# Patient Record
Sex: Female | Born: 1985 | Race: Black or African American | Hispanic: No | Marital: Single | Smoking: Former smoker
Health system: Southern US, Community
[De-identification: ages and names within clinical notes are randomized; demographics above are authoritative.]

## PROBLEM LIST (undated history)

## (undated) ENCOUNTER — Inpatient Hospital Stay (HOSPITAL_COMMUNITY): Payer: Self-pay

## (undated) DIAGNOSIS — E079 Disorder of thyroid, unspecified: Secondary | ICD-10-CM

## (undated) DIAGNOSIS — I10 Essential (primary) hypertension: Secondary | ICD-10-CM

## (undated) DIAGNOSIS — A749 Chlamydial infection, unspecified: Secondary | ICD-10-CM

## (undated) DIAGNOSIS — O24419 Gestational diabetes mellitus in pregnancy, unspecified control: Secondary | ICD-10-CM

## (undated) DIAGNOSIS — E669 Obesity, unspecified: Secondary | ICD-10-CM

## (undated) DIAGNOSIS — A599 Trichomoniasis, unspecified: Secondary | ICD-10-CM

## (undated) DIAGNOSIS — D219 Benign neoplasm of connective and other soft tissue, unspecified: Secondary | ICD-10-CM

## (undated) DIAGNOSIS — B86 Scabies: Secondary | ICD-10-CM

## (undated) HISTORY — DX: Disorder of thyroid, unspecified: E07.9

## (undated) HISTORY — PX: WISDOM TOOTH EXTRACTION: SHX21

---

## 2009-03-13 ENCOUNTER — Emergency Department (HOSPITAL_COMMUNITY): Admission: EM | Admit: 2009-03-13 | Discharge: 2009-03-13 | Payer: Self-pay | Admitting: Emergency Medicine

## 2009-04-04 ENCOUNTER — Encounter: Payer: Self-pay | Admitting: Emergency Medicine

## 2009-04-04 ENCOUNTER — Encounter (INDEPENDENT_AMBULATORY_CARE_PROVIDER_SITE_OTHER): Payer: Self-pay | Admitting: Obstetrics and Gynecology

## 2009-04-04 ENCOUNTER — Ambulatory Visit (HOSPITAL_COMMUNITY): Admission: AD | Admit: 2009-04-04 | Discharge: 2009-04-04 | Payer: Self-pay | Admitting: Obstetrics and Gynecology

## 2009-07-31 ENCOUNTER — Emergency Department (HOSPITAL_COMMUNITY): Admission: EM | Admit: 2009-07-31 | Discharge: 2009-07-31 | Payer: Self-pay | Admitting: Emergency Medicine

## 2009-12-29 ENCOUNTER — Emergency Department (HOSPITAL_COMMUNITY): Admission: EM | Admit: 2009-12-29 | Discharge: 2009-12-29 | Payer: Self-pay | Admitting: Family Medicine

## 2010-07-10 ENCOUNTER — Emergency Department (HOSPITAL_COMMUNITY): Admission: EM | Admit: 2010-07-10 | Discharge: 2010-07-10 | Payer: Self-pay | Admitting: Emergency Medicine

## 2010-09-03 ENCOUNTER — Emergency Department (HOSPITAL_COMMUNITY): Admission: EM | Admit: 2010-09-03 | Discharge: 2010-09-03 | Payer: Self-pay | Admitting: Emergency Medicine

## 2010-09-14 ENCOUNTER — Inpatient Hospital Stay (HOSPITAL_COMMUNITY)
Admission: AD | Admit: 2010-09-14 | Discharge: 2010-09-14 | Payer: Self-pay | Source: Home / Self Care | Admitting: Obstetrics & Gynecology

## 2010-09-18 ENCOUNTER — Emergency Department (HOSPITAL_COMMUNITY): Admission: EM | Admit: 2010-09-18 | Discharge: 2010-09-19 | Payer: Self-pay | Admitting: Emergency Medicine

## 2010-11-08 ENCOUNTER — Emergency Department (HOSPITAL_COMMUNITY)
Admission: EM | Admit: 2010-11-08 | Discharge: 2010-11-08 | Payer: Self-pay | Source: Home / Self Care | Admitting: Emergency Medicine

## 2010-11-12 LAB — DIFFERENTIAL
Basophils Absolute: 0 10*3/uL (ref 0.0–0.1)
Basophils Relative: 0 % (ref 0–1)
Eosinophils Absolute: 0.1 10*3/uL (ref 0.0–0.7)
Eosinophils Relative: 1 % (ref 0–5)
Lymphocytes Relative: 40 % (ref 12–46)
Lymphs Abs: 4.7 10*3/uL — ABNORMAL HIGH (ref 0.7–4.0)
Monocytes Absolute: 1.3 10*3/uL — ABNORMAL HIGH (ref 0.1–1.0)
Monocytes Relative: 11 % (ref 3–12)
Neutro Abs: 5.8 10*3/uL (ref 1.7–7.7)
Neutrophils Relative %: 49 % (ref 43–77)

## 2010-11-12 LAB — URINALYSIS, ROUTINE W REFLEX MICROSCOPIC
Bilirubin Urine: NEGATIVE
Ketones, ur: NEGATIVE mg/dL
Nitrite: POSITIVE — AB
Protein, ur: 100 mg/dL — AB
Specific Gravity, Urine: 1.022 (ref 1.005–1.030)
Urine Glucose, Fasting: NEGATIVE mg/dL
Urobilinogen, UA: 1 mg/dL (ref 0.0–1.0)
pH: 7 (ref 5.0–8.0)

## 2010-11-12 LAB — CBC
HCT: 41 % (ref 36.0–46.0)
Hemoglobin: 13.3 g/dL (ref 12.0–15.0)
MCH: 23.2 pg — ABNORMAL LOW (ref 26.0–34.0)
MCHC: 32.4 g/dL (ref 30.0–36.0)
MCV: 71.6 fL — ABNORMAL LOW (ref 78.0–100.0)
Platelets: 270 10*3/uL (ref 150–400)
RBC: 5.73 MIL/uL — ABNORMAL HIGH (ref 3.87–5.11)
RDW: 15.7 % — ABNORMAL HIGH (ref 11.5–15.5)
WBC: 11.9 10*3/uL — ABNORMAL HIGH (ref 4.0–10.5)

## 2010-11-12 LAB — BASIC METABOLIC PANEL
BUN: 10 mg/dL (ref 6–23)
CO2: 25 mEq/L (ref 19–32)
Calcium: 9.1 mg/dL (ref 8.4–10.5)
Chloride: 107 mEq/L (ref 96–112)
Creatinine, Ser: 0.7 mg/dL (ref 0.4–1.2)
GFR calc Af Amer: >60 mL/min (ref >60)
GFR calc non Af Amer: >60 mL/min (ref >60)
Glucose, Bld: 83 mg/dL (ref 70–99)
Potassium: 4.1 mEq/L (ref 3.5–5.1)
Sodium: 139 mEq/L (ref 135–145)

## 2010-11-12 LAB — URINE MICROSCOPIC-ADD ON

## 2010-11-12 LAB — LIPASE, BLOOD: Lipase: 23 U/L (ref 11–59)

## 2010-11-12 LAB — POCT PREGNANCY, URINE: Preg Test, Ur: NEGATIVE

## 2011-01-08 LAB — WET PREP, GENITAL
Trich, Wet Prep: NONE SEEN
Trich, Wet Prep: NONE SEEN
Yeast Wet Prep HPF POC: NONE SEEN
Yeast Wet Prep HPF POC: NONE SEEN

## 2011-01-08 LAB — URINALYSIS, ROUTINE W REFLEX MICROSCOPIC
Glucose, UA: NEGATIVE mg/dL
Glucose, UA: NEGATIVE mg/dL
Glucose, UA: NEGATIVE mg/dL
Hgb urine dipstick: NEGATIVE
Hgb urine dipstick: NEGATIVE
Hgb urine dipstick: NEGATIVE
Ketones, ur: 15 mg/dL — AB
Ketones, ur: 15 mg/dL — AB
Ketones, ur: >80 mg/dL — AB
Nitrite: NEGATIVE
Nitrite: NEGATIVE
Nitrite: NEGATIVE
Protein, ur: NEGATIVE mg/dL
Protein, ur: NEGATIVE mg/dL
Protein, ur: NEGATIVE mg/dL
Specific Gravity, Urine: 1.025 (ref 1.005–1.030)
Specific Gravity, Urine: 1.026 (ref 1.005–1.030)
Specific Gravity, Urine: 1.035 — ABNORMAL HIGH (ref 1.005–1.030)
Urobilinogen, UA: 1 mg/dL (ref 0.0–1.0)
Urobilinogen, UA: 1 mg/dL (ref 0.0–1.0)
Urobilinogen, UA: 1 mg/dL (ref 0.0–1.0)
pH: 6 (ref 5.0–8.0)
pH: 6 (ref 5.0–8.0)
pH: 7 (ref 5.0–8.0)

## 2011-01-08 LAB — CBC
HCT: 38.5 % (ref 36.0–46.0)
HCT: 38.8 % (ref 36.0–46.0)
Hemoglobin: 12.7 g/dL (ref 12.0–15.0)
Hemoglobin: 12.8 g/dL (ref 12.0–15.0)
MCH: 23.3 pg — ABNORMAL LOW (ref 26.0–34.0)
MCHC: 32.9 g/dL (ref 30.0–36.0)
MCHC: 33 g/dL (ref 30.0–36.0)
MCV: 70.7 fL — ABNORMAL LOW (ref 78.0–100.0)
Platelets: 245 10*3/uL (ref 150–400)
RBC: 5.49 MIL/uL — ABNORMAL HIGH (ref 3.87–5.11)
RDW: 16 % — ABNORMAL HIGH (ref 11.5–15.5)
WBC: 10 10*3/uL (ref 4.0–10.5)

## 2011-01-08 LAB — DIFFERENTIAL
Basophils Absolute: 0 10*3/uL (ref 0.0–0.1)
Basophils Relative: 0 % (ref 0–1)
Eosinophils Absolute: 0.3 10*3/uL (ref 0.0–0.7)
Eosinophils Relative: 3 % (ref 0–5)
Lymphocytes Relative: 27 % (ref 12–46)
Lymphs Abs: 2.7 10*3/uL (ref 0.7–4.0)
Monocytes Absolute: 1.1 10*3/uL — ABNORMAL HIGH (ref 0.1–1.0)
Monocytes Relative: 11 % (ref 3–12)
Neutro Abs: 5.9 10*3/uL (ref 1.7–7.7)
Neutrophils Relative %: 59 % (ref 43–77)

## 2011-01-08 LAB — URINE CULTURE
Colony Count: 15000
Culture  Setup Time: 201111071324

## 2011-01-08 LAB — URINE MICROSCOPIC-ADD ON

## 2011-01-08 LAB — GC/CHLAMYDIA PROBE AMP, GENITAL
Chlamydia, DNA Probe: NEGATIVE
GC Probe Amp, Genital: NEGATIVE

## 2011-01-08 LAB — BASIC METABOLIC PANEL
BUN: 3 mg/dL — ABNORMAL LOW (ref 6–23)
CO2: 24 mEq/L (ref 19–32)
Calcium: 9 mg/dL (ref 8.4–10.5)
Chloride: 105 mEq/L (ref 96–112)
Creatinine, Ser: 0.57 mg/dL (ref 0.4–1.2)
GFR calc Af Amer: >60 mL/min (ref >60)
GFR calc non Af Amer: >60 mL/min (ref >60)
Glucose, Bld: 79 mg/dL (ref 70–99)
Potassium: 3.4 mEq/L — ABNORMAL LOW (ref 3.5–5.1)
Sodium: 136 mEq/L (ref 135–145)

## 2011-01-08 LAB — POCT PREGNANCY, URINE
Preg Test, Ur: POSITIVE
Preg Test, Ur: POSITIVE

## 2011-01-08 LAB — ABO/RH: ABO/RH(D): O POS

## 2011-01-10 LAB — GC/CHLAMYDIA PROBE AMP, GENITAL
Chlamydia, DNA Probe: NEGATIVE
GC Probe Amp, Genital: NEGATIVE

## 2011-01-10 LAB — URINALYSIS, ROUTINE W REFLEX MICROSCOPIC
Glucose, UA: NEGATIVE mg/dL
Hgb urine dipstick: NEGATIVE
Ketones, ur: NEGATIVE mg/dL
Protein, ur: NEGATIVE mg/dL

## 2011-01-10 LAB — WET PREP, GENITAL: WBC, Wet Prep HPF POC: NONE SEEN

## 2011-01-10 LAB — POCT PREGNANCY, URINE: Preg Test, Ur: NEGATIVE

## 2011-01-21 LAB — POCT URINALYSIS DIP (DEVICE)
Glucose, UA: NEGATIVE mg/dL
Nitrite: NEGATIVE
Urobilinogen, UA: 0.2 mg/dL (ref 0.0–1.0)

## 2011-01-21 LAB — GC/CHLAMYDIA PROBE AMP, GENITAL: GC Probe Amp, Genital: NEGATIVE

## 2011-01-21 LAB — POCT PREGNANCY, URINE: Preg Test, Ur: NEGATIVE

## 2011-01-31 LAB — GC/CHLAMYDIA PROBE AMP, GENITAL
Chlamydia, DNA Probe: NEGATIVE
GC Probe Amp, Genital: NEGATIVE

## 2011-01-31 LAB — WET PREP, GENITAL

## 2011-01-31 LAB — URINALYSIS, ROUTINE W REFLEX MICROSCOPIC
Glucose, UA: NEGATIVE mg/dL
Specific Gravity, Urine: 1.026 (ref 1.005–1.030)

## 2011-01-31 LAB — URINE MICROSCOPIC-ADD ON

## 2011-02-04 LAB — BASIC METABOLIC PANEL
CO2: 26 mEq/L (ref 19–32)
Calcium: 9.2 mg/dL (ref 8.4–10.5)
Chloride: 109 mEq/L (ref 96–112)
Glucose, Bld: 91 mg/dL (ref 70–99)
Potassium: 3.7 mEq/L (ref 3.5–5.1)
Sodium: 141 mEq/L (ref 135–145)

## 2011-02-04 LAB — CBC
HCT: 38.7 % (ref 36.0–46.0)
Hemoglobin: 12.7 g/dL (ref 12.0–15.0)
MCHC: 32.8 g/dL (ref 30.0–36.0)
MCV: 73.1 fL — ABNORMAL LOW (ref 78.0–100.0)
RBC: 5.29 MIL/uL — ABNORMAL HIGH (ref 3.87–5.11)
RDW: 16.8 % — ABNORMAL HIGH (ref 11.5–15.5)

## 2011-02-04 LAB — WET PREP, GENITAL
Clue Cells Wet Prep HPF POC: NONE SEEN
Trich, Wet Prep: NONE SEEN

## 2011-02-04 LAB — POCT PREGNANCY, URINE: Preg Test, Ur: POSITIVE

## 2011-02-04 LAB — URINALYSIS, ROUTINE W REFLEX MICROSCOPIC
Ketones, ur: 15 mg/dL — AB
Nitrite: NEGATIVE
Urobilinogen, UA: 1 mg/dL (ref 0.0–1.0)
pH: 8 (ref 5.0–8.0)

## 2011-02-04 LAB — RPR: RPR Ser Ql: NONREACTIVE

## 2011-02-04 LAB — URINE MICROSCOPIC-ADD ON

## 2011-02-05 LAB — URINE MICROSCOPIC-ADD ON

## 2011-02-05 LAB — URINALYSIS, ROUTINE W REFLEX MICROSCOPIC
Glucose, UA: NEGATIVE mg/dL
Hgb urine dipstick: NEGATIVE
Protein, ur: 30 mg/dL — AB
Urobilinogen, UA: 1 mg/dL (ref 0.0–1.0)

## 2011-02-05 LAB — POCT I-STAT, CHEM 8
BUN: 7 mg/dL (ref 6–23)
Calcium, Ion: 0.91 mmol/L — ABNORMAL LOW (ref 1.12–1.32)
Creatinine, Ser: 0.7 mg/dL (ref 0.4–1.2)
Glucose, Bld: 106 mg/dL — ABNORMAL HIGH (ref 70–99)
TCO2: 20 mmol/L (ref 0–100)

## 2011-02-05 LAB — HCG, QUANTITATIVE, PREGNANCY: hCG, Beta Chain, Quant, S: 29308 m[IU]/mL — ABNORMAL HIGH (ref <5)

## 2011-02-05 LAB — ABO/RH: ABO/RH(D): O POS

## 2011-02-05 LAB — BASIC METABOLIC PANEL
BUN: 8 mg/dL (ref 6–23)
CO2: 17 mEq/L — ABNORMAL LOW (ref 19–32)
Chloride: 110 mEq/L (ref 96–112)
Creatinine, Ser: 0.52 mg/dL (ref 0.4–1.2)
Glucose, Bld: 98 mg/dL (ref 70–99)

## 2011-02-05 LAB — WET PREP, GENITAL

## 2011-03-12 NOTE — Op Note (Signed)
NAME:  Sara Walsh, MACDONNELL            ACCOUNT NO.:  [id number]   MEDICAL RECORD NO.:  [id number]          PATIENT TYPE:  AMB   LOCATION:  [facility]                          FACILITY:  [facility]   PHYSICIAN:  Carrington Clamp, M.D. DATE OF BIRTH:  [birth year]   DATE OF PROCEDURE:  04/04/2009  DATE OF DISCHARGE:  04/04/2009                               OPERATIVE REPORT   PREOPERATIVE DIAGNOSIS:  Missed abortion.   POSTOPERATIVE DIAGNOSIS:  Missed abortion.   PROCEDURE:  Dilation and evacuation.   SURGEON:  Carrington Clamp, M.D.   ASSISTANTS:  None.   ANESTHESIA:  General.   FINDINGS:  Eight-week size uterus down to 6-week size after the  procedure with good crie.   SPECIMENS:  Uterine contents to Pathology.   ESTIMATED BLOOD LOSS:  Minimal.   IV FLUIDS:  100 mL.   URINE OUTPUT:  Not measured.   COMPLICATIONS:  None.   MEDICATIONS:  Methergine.   COUNTS:  Correct x3.   TECHNIQUE:  After adequate LMA, anesthesia was achieved.  The patient  was prepped and draped in the usual sterile fashion in dorsal lithotomy  position.  The bladder was emptied with a catheter and the speculum  placed in the vagina.  A single-tooth tenaculum was used to stabilize  the cervix.  The cervix was dilated with Shawnie Pons dilators.  A 9-mm curette  was passed into the uterine cavity, and alternating suction and sharp  curettage was performed until good  crie was obtained.  A dose of Methergine was given IM.  There was  minimal bleeding, and all instruments were then removed from the vagina  and the patient tolerated the procedure well and was returned to  recovery room in stable condition.      Carrington Clamp, M.D.  Electronically Signed     MH/MEDQ  D:  04/04/2009  T:  04/05/2009  Job:  [id number]

## 2011-07-08 ENCOUNTER — Inpatient Hospital Stay (INDEPENDENT_AMBULATORY_CARE_PROVIDER_SITE_OTHER)
Admission: RE | Admit: 2011-07-08 | Discharge: 2011-07-08 | Disposition: A | Discharge status: Home / Self Care | Payer: Medicaid Other | Source: Ambulatory Visit | Attending: Emergency Medicine | Admitting: Emergency Medicine

## 2011-07-08 DIAGNOSIS — N390 Urinary tract infection, site not specified: Secondary | ICD-10-CM

## 2011-07-08 LAB — POCT PREGNANCY, URINE: Preg Test, Ur: NEGATIVE

## 2011-07-08 LAB — POCT URINALYSIS DIP (DEVICE)
Bilirubin Urine: NEGATIVE
Ketones, ur: NEGATIVE mg/dL
Nitrite: POSITIVE — AB

## 2011-07-08 LAB — POCT I-STAT, CHEM 8
Calcium, Ion: 1.21 mmol/L (ref 1.12–1.32)
Chloride: 104 mEq/L (ref 96–112)
HCT: 44 % (ref 36.0–46.0)
Hemoglobin: 15 g/dL (ref 12.0–15.0)
Potassium: 3.9 mEq/L (ref 3.5–5.1)

## 2011-07-10 LAB — URINE CULTURE
Colony Count: >=100000
Culture  Setup Time: 201209102053

## 2011-08-16 ENCOUNTER — Emergency Department (HOSPITAL_COMMUNITY)
Admission: EM | Admit: 2011-08-16 | Discharge: 2011-08-17 | Disposition: A | Discharge status: Home / Self Care | Payer: Medicaid Other | Attending: Emergency Medicine | Admitting: Emergency Medicine

## 2011-08-16 ENCOUNTER — Emergency Department (HOSPITAL_COMMUNITY): Payer: Medicaid Other

## 2011-08-16 DIAGNOSIS — R51 Headache: Secondary | ICD-10-CM | POA: Insufficient documentation

## 2011-08-16 DIAGNOSIS — H53149 Visual discomfort, unspecified: Secondary | ICD-10-CM | POA: Insufficient documentation

## 2011-08-16 DIAGNOSIS — R112 Nausea with vomiting, unspecified: Secondary | ICD-10-CM | POA: Insufficient documentation

## 2011-09-02 ENCOUNTER — Emergency Department (INDEPENDENT_AMBULATORY_CARE_PROVIDER_SITE_OTHER)
Admission: EM | Admit: 2011-09-02 | Discharge: 2011-09-02 | Disposition: A | Discharge status: Home / Self Care | Payer: Medicaid Other | Source: Home / Self Care | Attending: Emergency Medicine | Admitting: Emergency Medicine

## 2011-09-02 DIAGNOSIS — B86 Scabies: Secondary | ICD-10-CM

## 2011-09-02 MED ORDER — PERMETHRIN 5 % EX CREA: TOPICAL_CREAM | CUTANEOUS | Status: AC

## 2011-09-02 MED ORDER — TRIAMCINOLONE ACETONIDE 0.1 % EX CREA: TOPICAL_CREAM | Freq: Two times a day (BID) | CUTANEOUS | Status: DC

## 2011-09-02 NOTE — ED Provider Notes (Addendum)
History     CSN: [id number] Arrival date & time: 09/02/2011  8:45 AM   First MD Initiated Contact with Patient 09/02/11 [phone]      Chief Complaint  Patient presents with  . Pruritis    (Consider location/radiation/quality/duration/timing/severity/associated sxs/prior treatment) Patient is a 25 y.o. female presenting with rash.  Rash  This is a new problem. The current episode started more than 1 week ago. There has been no fever. The rash is present on the torso and groin. The patient is experiencing no pain. Associated symptoms include itching. Pertinent negatives include no weeping. She has tried nothing for the symptoms. The treatment provided no relief.  My partner,,,,also has it...its evry itchy and so more at night..Internal Medicine have had scabies before the rash is mainly in my inner tighs and also under my breast"...  History reviewed. No pertinent past medical history.  Past Surgical History  Procedure Date  . Cesarean section     x3    Family History  Problem Relation Age of Onset  . Hypertension Father     History  Substance Use Topics  . Smoking status: Current Some Day Smoker    Types: Cigars  . Smokeless tobacco: Not on file  . Alcohol Use: No    OB History    Grav Para Term Preterm Abortions TAB SAB Ect Mult Living                  Review of Systems  Constitutional: Negative.  Negative for fever and fatigue.  Skin: Positive for itching and rash.    Allergies  Review of patient's allergies indicates no known allergies.  Home Medications   Current Outpatient Rx  Name Route Sig Dispense Refill  . PERMETHRIN 5 % EX CREA  Apply to affected area once leave on for 10 hours repeat treatment in 7 days 60 g 0  . TRIAMCINOLONE ACETONIDE 0.1 % EX CREA Topical Apply topically 2 (two) times daily. 30 g 0    BP 112/77  Pulse 69  Temp(Src) 98.2 F (36.8 C) (Oral)  Resp 20  SpO2 100%  LMP 08/03/2011  Physical Exam  Vitals  reviewed. Constitutional: She appears well-nourished. No distress.  Musculoskeletal: Normal range of motion.  Skin: Rash noted. Rash is papular.     Psychiatric: She has a normal mood and affect.    ED Course  Procedures (including critical care time)  Labs Reviewed - No data to display No results found.   1. Scabies       MDM  PAPULAR ERUPTION- WITH POSITIVE HOUSEHOLD CONTACT-        Jimmie Molly 09/02/11 2148  Merrit Friesen 09/02/11 2150  Latreshia Beauchaine 09/02/11 2151  Aliyana Dlugosz 09/02/11 2154

## 2011-09-23 ENCOUNTER — Encounter (HOSPITAL_COMMUNITY): Payer: Self-pay | Admitting: *Deleted

## 2011-09-23 ENCOUNTER — Inpatient Hospital Stay (HOSPITAL_COMMUNITY)
Admission: AD | Admit: 2011-09-23 | Discharge: 2011-09-23 | Disposition: A | Discharge status: Home / Self Care | Payer: Medicaid Other | Source: Ambulatory Visit | Attending: Obstetrics & Gynecology | Admitting: Obstetrics & Gynecology

## 2011-09-23 ENCOUNTER — Inpatient Hospital Stay (HOSPITAL_COMMUNITY): Payer: Medicaid Other

## 2011-09-23 DIAGNOSIS — O210 Mild hyperemesis gravidarum: Secondary | ICD-10-CM | POA: Insufficient documentation

## 2011-09-23 DIAGNOSIS — O219 Vomiting of pregnancy, unspecified: Secondary | ICD-10-CM

## 2011-09-23 HISTORY — DX: Scabies: B86

## 2011-09-23 LAB — DIFFERENTIAL
Eosinophils Absolute: 0 10*3/uL (ref 0.0–0.7)
Lymphocytes Relative: 27 % (ref 12–46)
Lymphs Abs: 2.3 10*3/uL (ref 0.7–4.0)
Monocytes Relative: 10 % (ref 3–12)
Neutro Abs: 5.4 10*3/uL (ref 1.7–7.7)
Neutrophils Relative %: 63 % (ref 43–77)

## 2011-09-23 LAB — URINALYSIS, ROUTINE W REFLEX MICROSCOPIC
Glucose, UA: NEGATIVE mg/dL
Ketones, ur: 15 mg/dL — AB
Leukocytes, UA: NEGATIVE
Specific Gravity, Urine: >1.03 — ABNORMAL HIGH (ref 1.005–1.030)
pH: 6 (ref 5.0–8.0)

## 2011-09-23 LAB — COMPREHENSIVE METABOLIC PANEL
ALT: 15 U/L (ref 0–35)
Alkaline Phosphatase: 91 U/L (ref 39–117)
BUN: 12 mg/dL (ref 6–23)
CO2: 24 mEq/L (ref 19–32)
Chloride: 96 mEq/L (ref 96–112)
GFR calc Af Amer: >90 mL/min (ref >90)
GFR calc non Af Amer: >90 mL/min (ref >90)
Glucose, Bld: 88 mg/dL (ref 70–99)
Potassium: 3.3 mEq/L — ABNORMAL LOW (ref 3.5–5.1)
Sodium: 132 mEq/L — ABNORMAL LOW (ref 135–145)
Total Bilirubin: 0.6 mg/dL (ref 0.3–1.2)

## 2011-09-23 LAB — CBC
Hemoglobin: 13.5 g/dL (ref 12.0–15.0)
MCH: 23.4 pg — ABNORMAL LOW (ref 26.0–34.0)
Platelets: 274 10*3/uL (ref 150–400)
RBC: 5.78 MIL/uL — ABNORMAL HIGH (ref 3.87–5.11)
WBC: 8.5 10*3/uL (ref 4.0–10.5)

## 2011-09-23 LAB — HCG, QUANTITATIVE, PREGNANCY: hCG, Beta Chain, Quant, S: 24643 m[IU]/mL — ABNORMAL HIGH (ref <5)

## 2011-09-23 MED ORDER — PROMETHAZINE HCL 25 MG PO TABS: 12.5000 mg | ORAL_TABLET | Freq: Four times a day (QID) | ORAL | Status: DC | PRN

## 2011-09-23 MED ORDER — ONDANSETRON HCL 4 MG/2ML IJ SOLN
4.0000 mg | Freq: Once | INTRAMUSCULAR | Status: AC
  Administered 2011-09-23: 4 mg via INTRAVENOUS
  Filled 2011-09-23: qty 2

## 2011-09-23 MED ORDER — SODIUM CHLORIDE 0.9 % IV SOLN
INTRAVENOUS | Status: DC
  Administered 2011-09-23: 500 mL/h via INTRAVENOUS
  Administered 2011-09-23: 13:00:00 via INTRAVENOUS

## 2011-09-23 NOTE — Progress Notes (Signed)
Vomiting since Sat.  Can't keep anything down.    Headache off and on.  Rt eye blurring at times.  Has not been seen anywhere yet with preg.

## 2011-09-23 NOTE — ED Notes (Signed)
States had scabies 1 month ago and was treated.

## 2011-09-23 NOTE — ED Provider Notes (Signed)
History   Sara Walsh is a 25 YO [id number] at [redacted]w[redacted]d presents with nausea, vomiting, and headache.   Chief Complaint  Patient presents with  . Morning Sickness   HPI Patient reports having severe nausea and vomiting for the past 3 days. Patient had a hamburger, fries, and a snack before feeling ill and vomiting. She has since been unable to keep any food or water down and has ongoing nausea. Patient's headache has also been on going for 3 days. She has not tried any medications or remedies. Patient reports this nausea and vomiting is more severe than what she experienced with prior pregnancies. Patient has had chills, but denies difficulty with urination, bowel movements, or abdominal pain.    Past Medical History  Diagnosis Date  . Scabies     Past Surgical History  Procedure Date  . Cesarean section     x3    Family History  Problem Relation Age of Onset  . Hypertension Father   . Anesthesia problems Neg Hx     History  Substance Use Topics  . Smoking status: Former Smoker    Types: Cigars    Quit date: 09/09/2011  . Smokeless tobacco: Never Used  . Alcohol Use: No    Allergies: No Known Allergies  No prescriptions prior to admission    Review of Systems  Constitutional: Positive for chills.  Eyes: Positive for blurred vision.       Blurred vision in right eye, addressed previously and has gotten better.  Gastrointestinal: Positive for nausea and vomiting.  Genitourinary: Negative for dysuria.  Neurological: Positive for headaches. Negative for dizziness.   Physical Exam   Blood pressure 120/77, pulse 101, temperature 99.1 F (37.3 C), temperature source Oral, resp. rate 20, height 5' 2.75" (1.594 m), weight 112.492 kg (248 lb), last menstrual period 08/03/2011, SpO2 97.00%.  Physical Exam  Constitutional: She is oriented to person, place, and time. She appears well-developed and well-nourished.  Eyes: Conjunctivae and EOM are normal. Pupils are equal,  round, and reactive to light.  Cardiovascular: Normal rate, regular rhythm and normal heart sounds.   Respiratory: Effort normal and breath sounds normal.  GI: Soft.  Neurological: She is alert and oriented to person, place, and time.  Skin: Skin is warm.    MAU Course  Procedures Results for orders placed during the hospital encounter of 09/23/11 (from the past 24 hour(s))  URINALYSIS, ROUTINE W REFLEX MICROSCOPIC     Status: Abnormal   Collection Time   09/23/11 11:02 AM      Component Value Range   Color, Urine YELLOW  YELLOW    Appearance HAZY (*) CLEAR    Specific Gravity, Urine >1.030 (*) 1.005 - 1.030    pH 6.0  5.0 - 8.0    Glucose, UA NEGATIVE  NEGATIVE (mg/dL)   Hgb urine dipstick NEGATIVE  NEGATIVE    Bilirubin Urine SMALL (*) NEGATIVE    Ketones, ur 15 (*) NEGATIVE (mg/dL)   Protein, ur NEGATIVE  NEGATIVE (mg/dL)   Urobilinogen, UA 1.0  0.0 - 1.0 (mg/dL)   Nitrite NEGATIVE  NEGATIVE    Leukocytes, UA NEGATIVE  NEGATIVE   POCT PREGNANCY, URINE     Status: Normal   Collection Time   09/23/11 11:04 AM      Component Value Range   Preg Test, Ur POSITIVE    CBC     Status: Abnormal   Collection Time   09/23/11 12:07 PM  Component Value Range   WBC 8.5  4.0 - 10.5 (K/uL)   RBC 5.78 (*) 3.87 - 5.11 (MIL/uL)   Hemoglobin 13.5  12.0 - 15.0 (g/dL)   HCT 84.1  32.4 - 40.1 (%)   MCV 70.6 (*) 78.0 - 100.0 (fL)   MCH 23.4 (*) 26.0 - 34.0 (pg)   MCHC 33.1  30.0 - 36.0 (g/dL)   RDW 02.7 (*) 25.3 - 15.5 (%)   Platelets 274  150 - 400 (K/uL)  DIFFERENTIAL     Status: Normal   Collection Time   09/23/11 12:07 PM      Component Value Range   Neutrophils Relative 63  43 - 77 (%)   Neutro Abs 5.4  1.7 - 7.7 (K/uL)   Lymphocytes Relative 27  12 - 46 (%)   Lymphs Abs 2.3  0.7 - 4.0 (K/uL)   Monocytes Relative 10  3 - 12 (%)   Monocytes Absolute 0.8  0.1 - 1.0 (K/uL)   Eosinophils Relative 1  0 - 5 (%)   Eosinophils Absolute 0.0  0.0 - 0.7 (K/uL)   Basophils Relative 0   0 - 1 (%)   Basophils Absolute 0.0  0.0 - 0.1 (K/uL)  COMPREHENSIVE METABOLIC PANEL     Status: Abnormal   Collection Time   09/23/11 12:07 PM      Component Value Range   Sodium 132 (*) 135 - 145 (mEq/L)   Potassium 3.3 (*) 3.5 - 5.1 (mEq/L)   Chloride 96  96 - 112 (mEq/L)   CO2 24  19 - 32 (mEq/L)   Glucose, Bld 88  70 - 99 (mg/dL)   BUN 12  6 - 23 (mg/dL)   Creatinine, Ser 6.64  0.50 - 1.10 (mg/dL)   Calcium 9.7  8.4 - 40.3 (mg/dL)   Total Protein 7.4  6.0 - 8.3 (g/dL)   Albumin 3.5  3.5 - 5.2 (g/dL)   AST 14  0 - 37 (U/L)   ALT 15  0 - 35 (U/L)   Alkaline Phosphatase 91  39 - 117 (U/L)   Total Bilirubin 0.6  0.3 - 1.2 (mg/dL)   GFR calc non Af Amer >90  >90 (mL/min)   GFR calc Af Amer >90  >90 (mL/min)  HCG, QUANTITATIVE, PREGNANCY     Status: Abnormal   Collection Time   09/23/11 12:07 PM      Component Value Range   hCG, Beta Chain, Quant, [place] [address] (*) <5 (mIU/mL)  ABO/RH     Status: Normal   Collection Time   09/23/11 12:07 PM      Component Value Range   ABO/RH(D) O POS      MDM Ultrasound: confirmed 6 weeks 0 day GA Normal Saline IV Zofran IV  Assessment and Plan  1. Nausea/Vomiting: Patient was given normal saline and IV Zofran during MAU stay. Ultrasound ruled out molar pregnancy. Patient reports feeling much better after fluids and Zofran. Patient will be sent home with phenergan to take PO PRN. Encouraged patient to drink plenty of water to prevent dehydration. 2. Pregnancy: [facility]   Mosetta Putt, PA-S 09/23/2011, 11:51 AM   I have examined this patient with the student and assisted her with diagnosis and plan of care.  The patient states she is feeling much better and wants to go home. She will start her prenatal care and take phenergan for nausea.  Pregnancy verification letter given to patient.  [place], [place] 09/23/11 [phone]

## 2012-01-02 IMAGING — CT CT HEAD W/O CM
1 of 2 series · 13 of 30 positions shown, 17 images · non-contrast
Comparison: None.

CLINICAL DATA: Headache.  Blurred vision.  Nausea and vomiting.

CT HEAD WITHOUT CONTRAST
TECHNIQUE: Contiguous axial images were obtained from the base of
the skull through the vertex without contrast.

[Series 2: brain · axial · 0.49mm/px · z∈[+101,+234]mm · 13 of 32 slices shown, 17 images]
[im 3/32  brain]
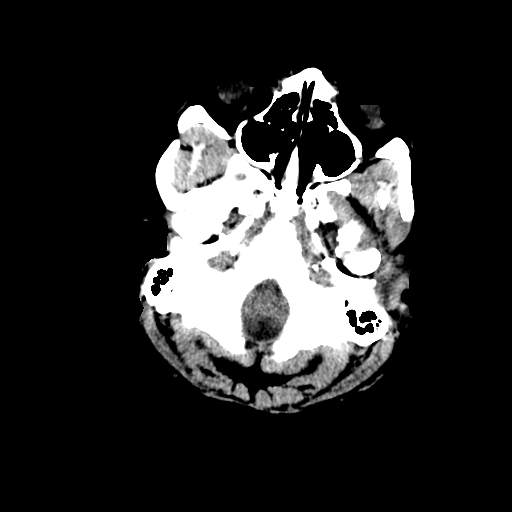
[im 3/32  bone]
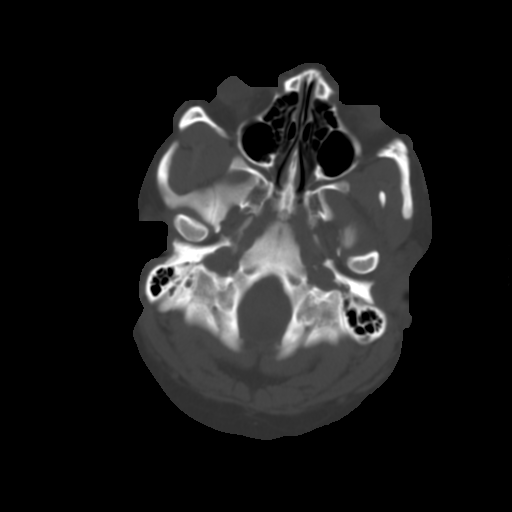
[im 5/32  brain]
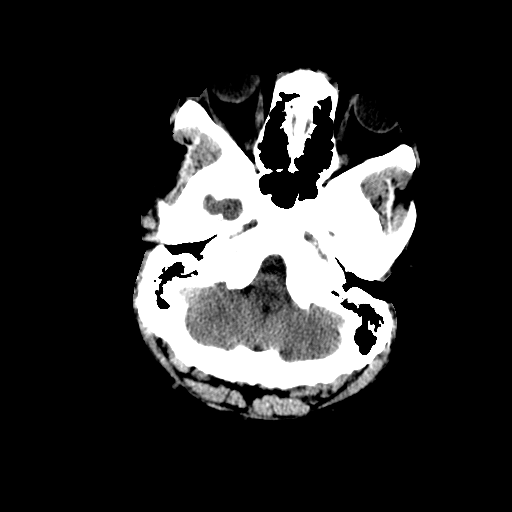
[im 7/32  brain]
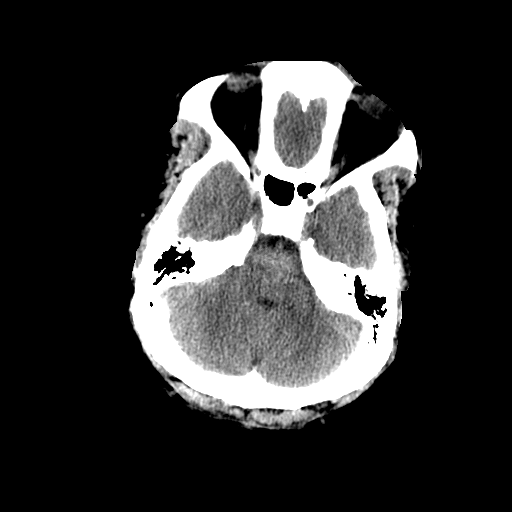
[im 9/32  brain]
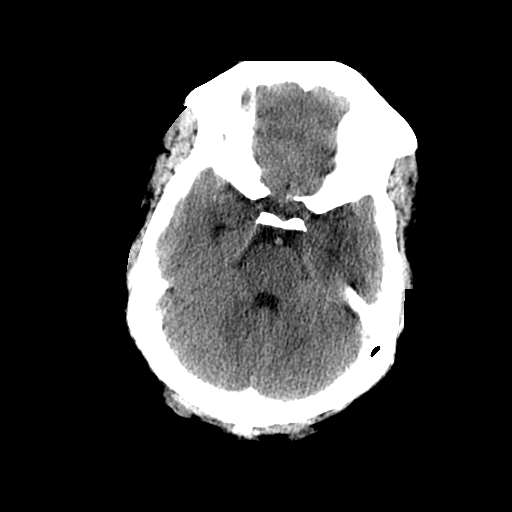
[im 12/32  brain]
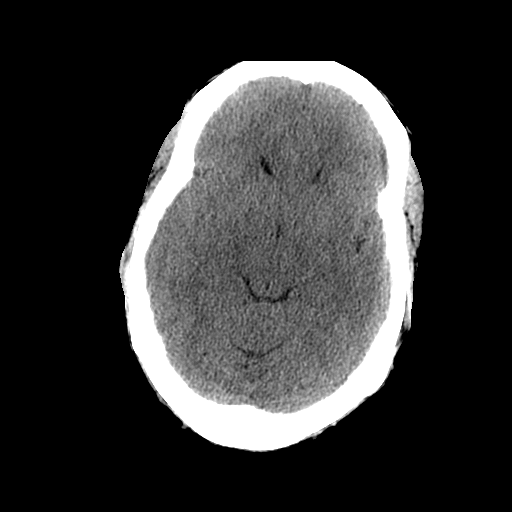
[im 12/32  bone]
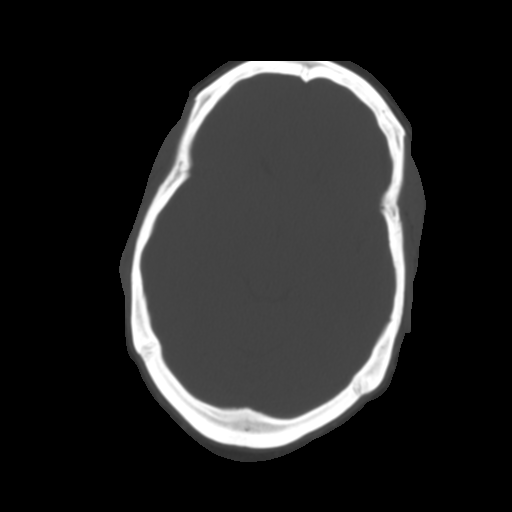
[im 14/32  brain]
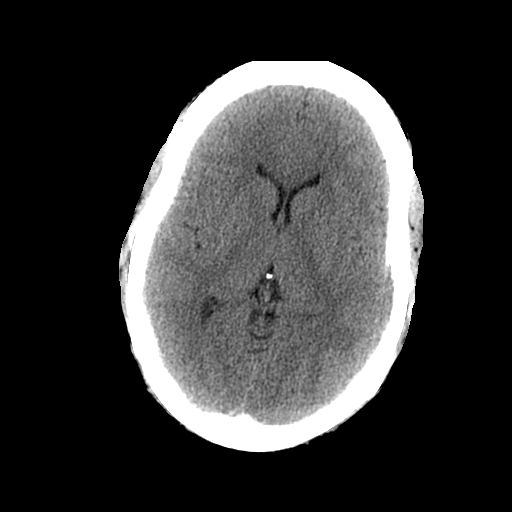
[im 16/32  brain]
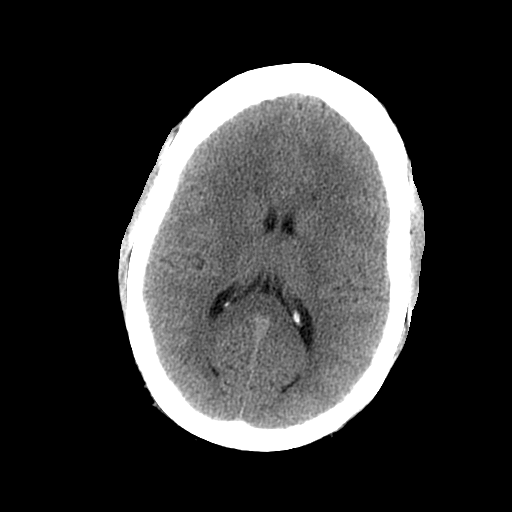
[im 18/32  brain]
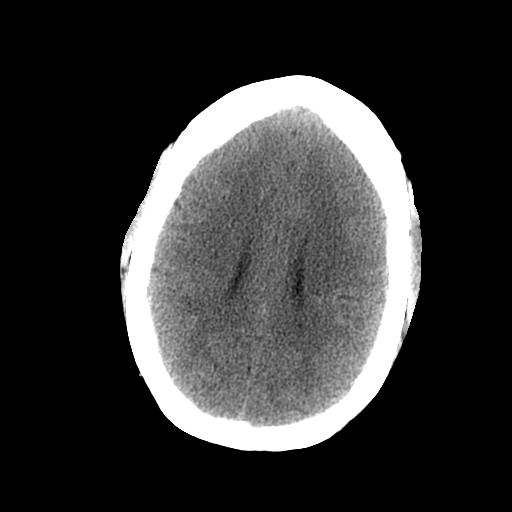
[im 20/32  brain]
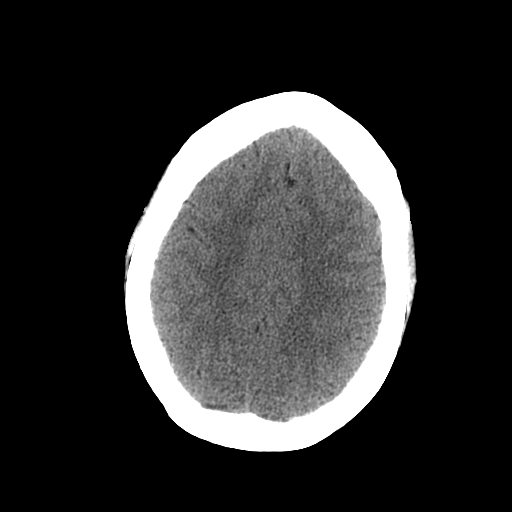
[im 20/32  bone]
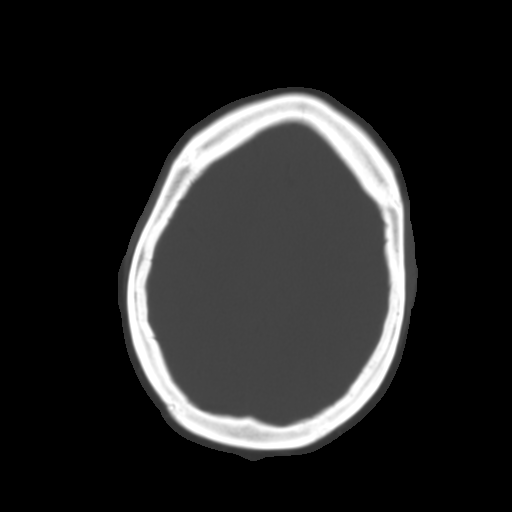
[im 23/32  brain]
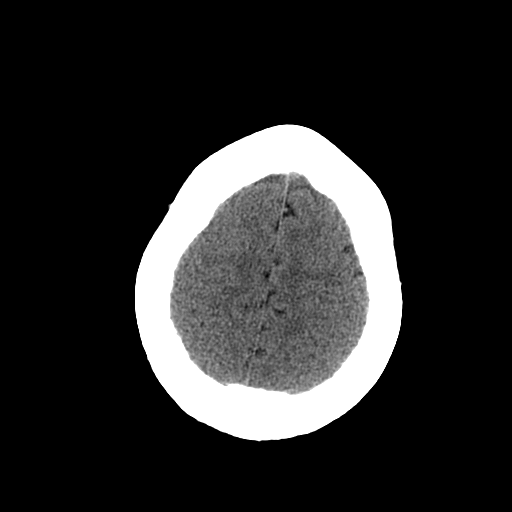
[im 25/32  brain]
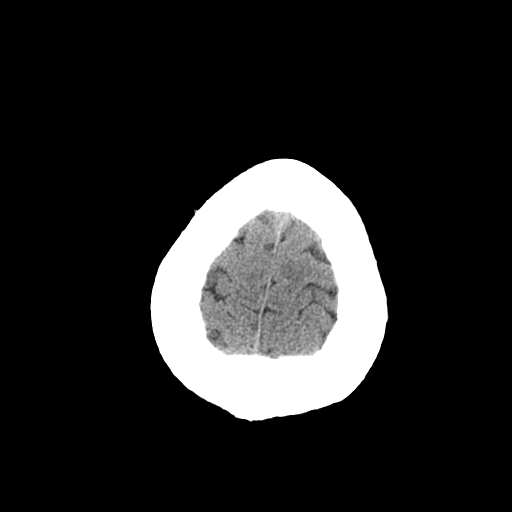
[im 27/32  brain]
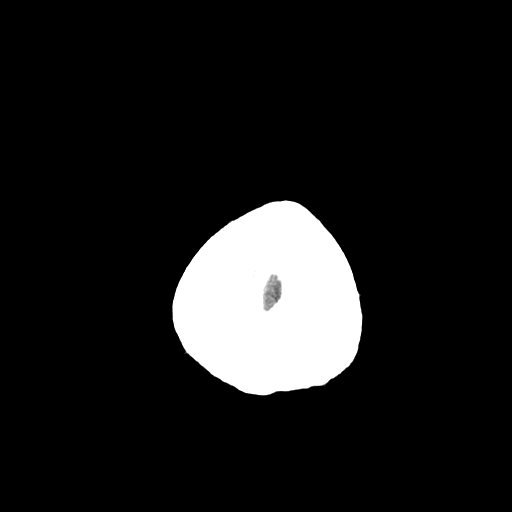
[im 29/32  brain]
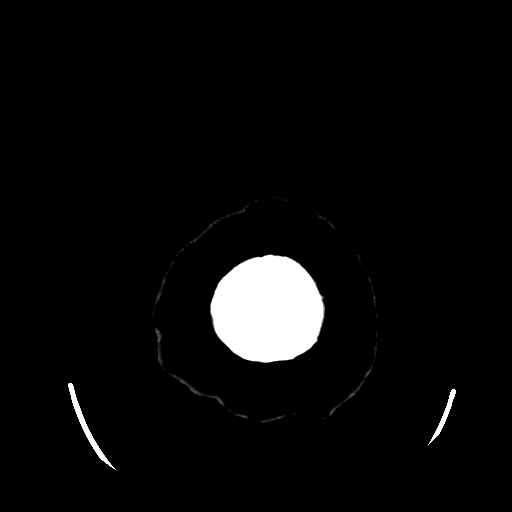
[im 29/32  bone]
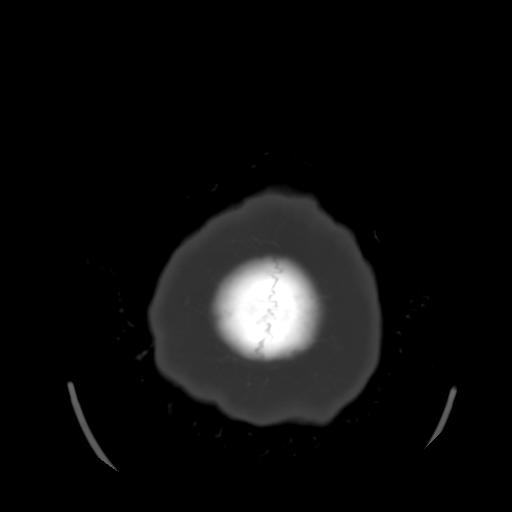

[13 of 30 positions shown; findings below may reference images not displayed]

FINDINGS: The brain stem, cerebellum, cerebral peduncles, thalami,
basal ganglia, basilar cisterns, and ventricular system appear
unremarkable.

No intracranial hemorrhage, mass lesion, or acute infarction is
identified.
IMPRESSION: No significant abnormality identified.

## 2013-12-23 ENCOUNTER — Emergency Department (HOSPITAL_COMMUNITY)
Admission: EM | Admit: 2013-12-23 | Discharge: 2013-12-23 | Disposition: A | Discharge status: Home / Self Care | Payer: Medicaid Other | Attending: Emergency Medicine | Admitting: Emergency Medicine

## 2013-12-23 ENCOUNTER — Encounter (HOSPITAL_COMMUNITY): Payer: Self-pay | Admitting: Emergency Medicine

## 2013-12-23 DIAGNOSIS — Z3202 Encounter for pregnancy test, result negative: Secondary | ICD-10-CM | POA: Insufficient documentation

## 2013-12-23 DIAGNOSIS — N760 Acute vaginitis: Secondary | ICD-10-CM | POA: Insufficient documentation

## 2013-12-23 DIAGNOSIS — A499 Bacterial infection, unspecified: Secondary | ICD-10-CM | POA: Insufficient documentation

## 2013-12-23 DIAGNOSIS — R109 Unspecified abdominal pain: Secondary | ICD-10-CM

## 2013-12-23 DIAGNOSIS — B9689 Other specified bacterial agents as the cause of diseases classified elsewhere: Secondary | ICD-10-CM | POA: Insufficient documentation

## 2013-12-23 DIAGNOSIS — R1032 Left lower quadrant pain: Secondary | ICD-10-CM | POA: Insufficient documentation

## 2013-12-23 DIAGNOSIS — Z79899 Other long term (current) drug therapy: Secondary | ICD-10-CM | POA: Insufficient documentation

## 2013-12-23 DIAGNOSIS — F17200 Nicotine dependence, unspecified, uncomplicated: Secondary | ICD-10-CM | POA: Insufficient documentation

## 2013-12-23 DIAGNOSIS — Z8619 Personal history of other infectious and parasitic diseases: Secondary | ICD-10-CM | POA: Insufficient documentation

## 2013-12-23 LAB — COMPREHENSIVE METABOLIC PANEL
ALK PHOS: 89 U/L (ref 39–117)
ALT: 11 U/L (ref 0–35)
AST: 12 U/L (ref 0–37)
Albumin: 3.5 g/dL (ref 3.5–5.2)
BUN: 11 mg/dL (ref 6–23)
CALCIUM: 8.8 mg/dL (ref 8.4–10.5)
CO2: 24 mEq/L (ref 19–32)
CREATININE: 0.76 mg/dL (ref 0.50–1.10)
Chloride: 103 mEq/L (ref 96–112)
GFR calc non Af Amer: >90 mL/min (ref >90)
GLUCOSE: 83 mg/dL (ref 70–99)
POTASSIUM: 3.8 meq/L (ref 3.7–5.3)
Sodium: 139 mEq/L (ref 137–147)
TOTAL PROTEIN: 7.3 g/dL (ref 6.0–8.3)
Total Bilirubin: 0.7 mg/dL (ref 0.3–1.2)

## 2013-12-23 LAB — CBC WITH DIFFERENTIAL/PLATELET
BASOS ABS: 0 10*3/uL (ref 0.0–0.1)
BASOS PCT: 0 % (ref 0–1)
Eosinophils Absolute: 0.1 10*3/uL (ref 0.0–0.7)
Eosinophils Relative: 2 % (ref 0–5)
HEMATOCRIT: 39 % (ref 36.0–46.0)
HEMOGLOBIN: 13.2 g/dL (ref 12.0–15.0)
LYMPHS PCT: 38 % (ref 12–46)
Lymphs Abs: 2.2 10*3/uL (ref 0.7–4.0)
MCH: 23.2 pg — ABNORMAL LOW (ref 26.0–34.0)
MCHC: 33.8 g/dL (ref 30.0–36.0)
MCV: 68.5 fL — AB (ref 78.0–100.0)
MONOS PCT: 10 % (ref 3–12)
Monocytes Absolute: 0.6 10*3/uL (ref 0.1–1.0)
Neutro Abs: 3 10*3/uL (ref 1.7–7.7)
Neutrophils Relative %: 50 % (ref 43–77)
Platelets: 286 10*3/uL (ref 150–400)
RBC: 5.69 MIL/uL — AB (ref 3.87–5.11)
RDW: 16.1 % — ABNORMAL HIGH (ref 11.5–15.5)
WBC: 5.9 10*3/uL (ref 4.0–10.5)

## 2013-12-23 LAB — URINALYSIS, ROUTINE W REFLEX MICROSCOPIC
Bilirubin Urine: NEGATIVE
GLUCOSE, UA: NEGATIVE mg/dL
Hgb urine dipstick: NEGATIVE
Ketones, ur: NEGATIVE mg/dL
LEUKOCYTES UA: NEGATIVE
Nitrite: NEGATIVE
PROTEIN: NEGATIVE mg/dL
Specific Gravity, Urine: 1.026 (ref 1.005–1.030)
UROBILINOGEN UA: 0.2 mg/dL (ref 0.0–1.0)
pH: 5.5 (ref 5.0–8.0)

## 2013-12-23 LAB — WET PREP, GENITAL
TRICH WET PREP: NONE SEEN
WBC, Wet Prep HPF POC: NONE SEEN
Yeast Wet Prep HPF POC: NONE SEEN

## 2013-12-23 LAB — POC URINE PREG, ED: Preg Test, Ur: NEGATIVE

## 2013-12-23 MED ORDER — METRONIDAZOLE 500 MG PO TABS: 500.0000 mg | ORAL_TABLET | Freq: Two times a day (BID) | ORAL | Status: DC

## 2013-12-23 MED ORDER — IBUPROFEN 800 MG PO TABS: 800.0000 mg | ORAL_TABLET | Freq: Four times a day (QID) | ORAL | Status: DC | PRN

## 2013-12-23 MED ORDER — IBUPROFEN 800 MG PO TABS
800.0000 mg | ORAL_TABLET | Freq: Once | ORAL | Status: AC
  Administered 2013-12-23: 800 mg via ORAL
  Filled 2013-12-23: qty 1

## 2013-12-23 NOTE — ED Notes (Signed)
Pt c/o intermittent LLQ pain for past week. Denies n/v. shes also c/o a "pressure" while urinating.

## 2013-12-23 NOTE — ED Provider Notes (Signed)
CSN: [id number]     Arrival date & time 12/23/13  1605 History   First MD Initiated Contact with Patient 12/23/13 1617     Chief Complaint  Patient presents with  . Abdominal Pain     (Consider location/radiation/quality/duration/timing/severity/associated sxs/prior Treatment) HPI Comments: Patient is a 28 year old female presented to the emergency department intermittent episodes of sore and achy left lower quadrant pain with occasional radiation to suprapubic area. She states her pain is currently a 3/10. She denies any alleviating factors, but has not taken any over-the-counter medications to help her symptoms. Patient states her pain is exacerbated with urinating. She denies any fevers, chills, nausea, vomiting, diarrhea, constipation, vaginal bleeding, dysuria, urinary urgency or frequency. She states she does have a little white vaginal discharge this morning. Last menstrual period was 3 weeks ago. Patient's abdominal surgical history includes a cesarean section.  Patient is a 28 y.o. female presenting with abdominal pain.  Abdominal Pain Associated symptoms: vaginal discharge   Associated symptoms: no chest pain, no chills, no constipation, no diarrhea, no fever, no hematuria, no nausea, no shortness of breath, no vaginal bleeding and no vomiting     Past Medical History  Diagnosis Date  . Scabies    Past Surgical History  Procedure Laterality Date  . Cesarean section      x3   Family History  Problem Relation Age of Onset  . Hypertension Father   . Anesthesia problems Neg Hx    History  Substance Use Topics  . Smoking status: Current Every Day Smoker    Types: Cigars, Cigarettes    Last Attempt to Quit: 09/09/2011  . Smokeless tobacco: Never Used  . Alcohol Use: No   OB History   Grav Para Term Preterm Abortions TAB SAB Ect Mult Living   6 3 3  0 2 1 1  0 0 3     Review of Systems  Constitutional: Negative for fever and chills.  Respiratory: Negative for shortness  of breath.   Cardiovascular: Negative for chest pain.  Gastrointestinal: Positive for abdominal pain. Negative for nausea, vomiting, diarrhea, constipation, blood in stool, abdominal distention, anal bleeding and rectal pain.  Genitourinary: Positive for vaginal discharge. Negative for urgency, hematuria, flank pain, decreased urine volume, vaginal bleeding, vaginal pain and pelvic pain.  All other systems reviewed and are negative.      Allergies  Review of patient's allergies indicates no known allergies.  Home Medications   Current Outpatient Rx  Name  Route  Sig  Dispense  Refill  . ibuprofen (ADVIL,MOTRIN) 800 MG tablet   Oral   Take 1 tablet (800 mg total) by mouth every 6 (six) hours as needed for mild pain, moderate pain or cramping.   30 tablet   0   . metroNIDAZOLE (FLAGYL) 500 MG tablet   Oral   Take 1 tablet (500 mg total) by mouth 2 (two) times daily.   14 tablet   0    BP 131/82  Pulse 86  Temp(Src) 99.3 F (37.4 C) (Oral)  Resp 20  SpO2 95%  LMP 11/22/2013  Breastfeeding? Unknown Physical Exam  Constitutional: She is oriented to person, place, and time. She appears well-developed and well-nourished. No distress.  HENT:  Head: Normocephalic and atraumatic.  Right Ear: External ear normal.  Left Ear: External ear normal.  Nose: Nose normal.  Mouth/Throat: Oropharynx is clear and moist. No oropharyngeal exudate.  Eyes: Conjunctivae are normal.  Neck: Normal range of motion. Neck supple.  Cardiovascular: Normal  rate, regular rhythm and normal heart sounds.   Pulmonary/Chest: Effort normal and breath sounds normal. No respiratory distress.  Abdominal: Soft. Bowel sounds are normal. She exhibits no distension. There is no tenderness. There is no rebound and no guarding.  Musculoskeletal: Normal range of motion.  Neurological: She is alert and oriented to person, place, and time.  Skin: Skin is warm and dry. She is not diaphoretic.  Psychiatric: She has a  normal mood and affect.   Exam performed by Baron Sane L,  exam chaperoned Date: 12/23/2013 Pelvic exam: normal external genitalia without evidence of trauma. VULVA: normal appearing vulva with no masses, tenderness or lesion. VAGINA: normal appearing vagina with normal color and discharge, no lesions. CERVIX: normal appearing cervix without lesions, cervical motion tenderness absent, cervical os closed with out purulent discharge; vaginal discharge - white, Wet prep and DNA probe for chlamydia and GC obtained.   ADNEXA: normal adnexa in size, nontender and no masses UTERUS: uterus is normal size, shape, consistency and nontender.    ED Course  Procedures (including critical care time) Medications  ibuprofen (ADVIL,MOTRIN) tablet 800 mg (800 mg Oral Given 12/23/13 1728)    Labs Review Labs Reviewed  WET PREP, GENITAL - Abnormal; Notable for the following:    Clue Cells Wet Prep HPF POC FEW (*)    All other components within normal limits  CBC WITH DIFFERENTIAL - Abnormal; Notable for the following:    RBC 5.69 (*)    MCV 68.5 (*)    MCH 23.2 (*)    RDW 16.1 (*)    All other components within normal limits  GC/CHLAMYDIA PROBE AMP  COMPREHENSIVE METABOLIC PANEL  URINALYSIS, ROUTINE W REFLEX MICROSCOPIC  POC URINE PREG, ED   Imaging Review No results found.  EKG Interpretation   None       MDM   Final diagnoses:  Bacterial vaginosis  Abdominal pain in female patient    Filed Vitals:   12/23/13 1611  BP: 131/82  Pulse: 86  Temp: 99.3 F (37.4 C)  Resp: 20   Afebrile, NAD, non-toxic appearing, AAOx4. Patient is nontoxic, nonseptic appearing, in no apparent distress.  Patient's pain and other symptoms adequately managed in emergency department.  Fluids tolerated in emergency department.  Labs, and vitals reviewed.  Patient does not meet the SIRS or Sepsis criteria.  On repeat exam patient does not have a surgical abdomen and there are nor peritoneal  signs.  No indication of appendicitis, bowel obstruction, bowel perforation, cholecystitis, diverticulitis, PID or ectopic pregnancy.  Wet prep revealed clue cells, will treat for bacterial vaginosis. GC and Chlamydia pending, patient is informed that she will be notified in 48-72 hours of positive results and will require return here or to her doctor for treatment. She is advised that she is required to have treatment for [facility] and chlamydia she should refrain from all sexual activities for 10 days to allow the medications to work. Patient is also advised not to drink on Flagyl. Patient discharged home with symptomatic treatment and given strict instructions for follow-up with their primary care physician.  I have also discussed reasons to return immediately to the ER.  Patient expresses understanding and agrees with plan.         [place], PA-C 12/24/13 0031

## 2013-12-23 NOTE — Discharge Instructions (Signed)
Please follow up with your primary care physician in 1-2 days. If you do not have one please call the [place] number listed above. Please follow up with the ob/gyn to schedule a follow up appointment.  Please take Motrin as prescribed. Please take your antibiotic until completion.  Please read all discharge instructions and return precautions.    Abdominal Pain, Women Abdominal (stomach, pelvic, or belly) pain can be caused by many things. It is important to tell your doctor:  The location of the pain.  Does it come and go or is it present all the time?  Are there things that start the pain (eating certain foods, exercise)?  Are there other symptoms associated with the pain (fever, nausea, vomiting, diarrhea)? All of this is helpful to know when trying to find the cause of the pain. CAUSES   Stomach: virus or bacteria infection, or ulcer.  Intestine: appendicitis (inflamed appendix), regional ileitis (Crohn's disease), ulcerative colitis (inflamed colon), irritable bowel syndrome, diverticulitis (inflamed diverticulum of the colon), or cancer of the stomach or intestine.  Gallbladder disease or stones in the gallbladder.  Kidney disease, kidney stones, or infection.  Pancreas infection or cancer.  Fibromyalgia (pain disorder).  Diseases of the female organs:  Uterus: fibroid (non-cancerous) tumors or infection.  Fallopian tubes: infection or tubal pregnancy.  Ovary: cysts or tumors.  Pelvic adhesions (scar tissue).  Endometriosis (uterus lining tissue growing in the pelvis and on the pelvic organs).  Pelvic congestion syndrome (female organs filling up with blood just before the menstrual period).  Pain with the menstrual period.  Pain with ovulation (producing an egg).  Pain with an IUD (intrauterine device, birth control) in the uterus.  Cancer of the female organs.  Functional pain (pain not caused by a disease, may improve without  treatment).  Psychological pain.  Depression. DIAGNOSIS  Your doctor will decide the seriousness of your pain by doing an examination.  Blood tests.  X-rays.  Ultrasound.  CT scan (computed tomography, special type of X-ray).  MRI (magnetic resonance imaging).  Cultures, for infection.  Barium enema (dye inserted in the large intestine, to better view it with X-rays).  Colonoscopy (looking in intestine with a lighted tube).  Laparoscopy (minor surgery, looking in abdomen with a lighted tube).  Major abdominal exploratory surgery (looking in abdomen with a large incision). TREATMENT  The treatment will depend on the cause of the pain.   Many cases can be observed and treated at home.  Over-the-counter medicines recommended by your caregiver.  Prescription medicine.  Antibiotics, for infection.  Birth control pills, for painful periods or for ovulation pain.  Hormone treatment, for endometriosis.  Nerve blocking injections.  Physical therapy.  Antidepressants.  Counseling with a psychologist or psychiatrist.  Minor or major surgery. HOME CARE INSTRUCTIONS   Do not take laxatives, unless directed by your caregiver.  Take over-the-counter pain medicine only if ordered by your caregiver. Do not take aspirin because it can cause an upset stomach or bleeding.  Try a clear liquid diet (broth or water) as ordered by your caregiver. Slowly move to a bland diet, as tolerated, if the pain is related to the stomach or intestine.  Have a thermometer and take your temperature several times a day, and record it.  Bed rest and sleep, if it helps the pain.  Avoid sexual intercourse, if it causes pain.  Avoid stressful situations.  Keep your follow-up appointments and tests, as your caregiver orders.  If the  pain does not go away with medicine or surgery, you may try:  Acupuncture.  Relaxation exercises (yoga, meditation).  Group therapy.  Counseling. SEEK  MEDICAL CARE IF:   You notice certain foods cause stomach pain.  Your home care treatment is not helping your pain.  You need stronger pain medicine.  You want your IUD removed.  You feel faint or lightheaded.  You develop nausea and vomiting.  You develop a rash.  You are having side effects or an allergy to your medicine. SEEK IMMEDIATE MEDICAL CARE IF:   Your pain does not go away or gets worse.  You have a fever.  Your pain is felt only in portions of the abdomen. The right side could possibly be appendicitis. The left lower portion of the abdomen could be colitis or diverticulitis.  You are passing blood in your stools (bright red or black tarry stools, with or without vomiting).  You have blood in your urine.  You develop chills, with or without a fever.  You pass out. MAKE SURE YOU:   Understand these instructions.  Will watch your condition.  Will get help right away if you are not doing well or get worse. Document Released: 08/11/2007 Document Revised: 01/06/2012 Document Reviewed: 08/31/2009 [facility] Patient Information 2014 [place], [place]. Bacterial Vaginosis Bacterial vaginosis is a vaginal infection that occurs when the normal balance of bacteria in the vagina is disrupted. It results from an overgrowth of certain bacteria. This is the most common vaginal infection in women of childbearing age. Treatment is important to prevent complications, especially in pregnant women, as it can cause a premature delivery. CAUSES  Bacterial vaginosis is caused by an increase in harmful bacteria that are normally present in smaller amounts in the vagina. Several different kinds of bacteria can cause bacterial vaginosis. However, the reason that the condition develops is not fully understood. RISK FACTORS Certain activities or behaviors can put you at an increased risk of developing bacterial vaginosis, including:  Having a new sex partner or multiple sex  partners.  Douching.  Using an intrauterine device (IUD) for contraception. Women do not get bacterial vaginosis from toilet seats, bedding, swimming pools, or contact with objects around them. SIGNS AND SYMPTOMS  Some women with bacterial vaginosis have no signs or symptoms. Common symptoms include:  Grey vaginal discharge.  A fishlike odor with discharge, especially after sexual intercourse.  Itching or burning of the vagina and vulva.  Burning or pain with urination. DIAGNOSIS  Your health care provider will take a medical history and examine the vagina for signs of bacterial vaginosis. A sample of vaginal fluid may be taken. Your health care provider will look at this sample under a microscope to check for bacteria and abnormal cells. A vaginal pH test may also be done.  TREATMENT  Bacterial vaginosis may be treated with antibiotic medicines. These may be given in the form of a pill or a vaginal cream. A second round of antibiotics may be prescribed if the condition comes back after treatment.  HOME CARE INSTRUCTIONS   Only take over-the-counter or prescription medicines as directed by your health care provider.  If antibiotic medicine was prescribed, take it as directed. Make sure you finish it even if you start to feel better.  Do not have sex until treatment is completed.  Tell all sexual partners that you have a vaginal infection. They should see their health care provider and be treated if they have problems, such as a mild rash or  itching.  Practice safe sex by using condoms and only having one sex partner. SEEK MEDICAL CARE IF:   Your symptoms are not improving after 3 days of treatment.  You have increased discharge or pain.  You have a fever. MAKE SURE YOU:   Understand these instructions.  Will watch your condition.  Will get help right away if you are not doing well or get worse. FOR MORE INFORMATION  Centers for Disease Control and Prevention, Division of  STD Prevention: [employer] American Sexual Health Association (ASHA): www.ashastd.org  Document Released: 10/14/2005 Document Revised: 08/04/2013 Document Reviewed: 05/26/2013 [facility] Patient Information 2014 [place].

## 2013-12-24 LAB — GC/CHLAMYDIA PROBE AMP
CT Probe RNA: NEGATIVE
GC PROBE AMP APTIMA: NEGATIVE

## 2013-12-24 NOTE — ED Provider Notes (Signed)
Medical screening examination/treatment/procedure(s) were performed by non-physician practitioner and as supervising physician I was immediately available for consultation/collaboration.   Neta Ehlers, MD 12/24/13 1329

## 2014-02-14 ENCOUNTER — Encounter (HOSPITAL_COMMUNITY): Payer: Self-pay | Admitting: Emergency Medicine

## 2014-02-14 DIAGNOSIS — N730 Acute parametritis and pelvic cellulitis: Secondary | ICD-10-CM | POA: Insufficient documentation

## 2014-02-14 DIAGNOSIS — Z87891 Personal history of nicotine dependence: Secondary | ICD-10-CM | POA: Insufficient documentation

## 2014-02-14 DIAGNOSIS — Z8619 Personal history of other infectious and parasitic diseases: Secondary | ICD-10-CM | POA: Insufficient documentation

## 2014-02-14 NOTE — ED Notes (Signed)
Pt states vaginal pain and discharge for two days.

## 2014-02-15 ENCOUNTER — Emergency Department (HOSPITAL_COMMUNITY)
Admission: EM | Admit: 2014-02-15 | Discharge: 2014-02-15 | Disposition: A | Discharge status: Home / Self Care | Payer: Medicaid Other | Attending: Emergency Medicine | Admitting: Emergency Medicine

## 2014-02-15 DIAGNOSIS — N730 Acute parametritis and pelvic cellulitis: Secondary | ICD-10-CM

## 2014-02-15 LAB — WET PREP, GENITAL
Trich, Wet Prep: NONE SEEN
Yeast Wet Prep HPF POC: NONE SEEN

## 2014-02-15 LAB — URINE MICROSCOPIC-ADD ON

## 2014-02-15 LAB — URINALYSIS, ROUTINE W REFLEX MICROSCOPIC
BILIRUBIN URINE: NEGATIVE
GLUCOSE, UA: NEGATIVE mg/dL
Ketones, ur: NEGATIVE mg/dL
Nitrite: NEGATIVE
Protein, ur: 30 mg/dL — AB
SPECIFIC GRAVITY, URINE: 1.025 (ref 1.005–1.030)
Urobilinogen, UA: 0.2 mg/dL (ref 0.0–1.0)
pH: 5.5 (ref 5.0–8.0)

## 2014-02-15 LAB — GC/CHLAMYDIA PROBE AMP
CT PROBE, AMP APTIMA: NEGATIVE
GC PROBE AMP APTIMA: NEGATIVE

## 2014-02-15 MED ORDER — STERILE WATER FOR INJECTION IJ SOLN
INTRAMUSCULAR | Status: AC
  Administered 2014-02-15: 0.9 mL
  Filled 2014-02-15: qty 10

## 2014-02-15 MED ORDER — METRONIDAZOLE 500 MG PO TABS
2000.0000 mg | ORAL_TABLET | Freq: Once | ORAL | Status: AC
  Administered 2014-02-15: 2000 mg via ORAL
  Filled 2014-02-15: qty 4

## 2014-02-15 MED ORDER — AZITHROMYCIN 250 MG PO TABS
1000.0000 mg | ORAL_TABLET | Freq: Once | ORAL | Status: AC
  Administered 2014-02-15: 1000 mg via ORAL
  Filled 2014-02-15: qty 4

## 2014-02-15 MED ORDER — DOXYCYCLINE HYCLATE 100 MG PO CAPS: 100.0000 mg | ORAL_CAPSULE | Freq: Two times a day (BID) | ORAL | Status: DC

## 2014-02-15 MED ORDER — CEFTRIAXONE SODIUM 250 MG IJ SOLR
250.0000 mg | Freq: Once | INTRAMUSCULAR | Status: AC
  Administered 2014-02-15: 250 mg via INTRAMUSCULAR
  Filled 2014-02-15: qty 250

## 2014-02-15 NOTE — ED Notes (Signed)
Discharge and follow up instructions reviewed. Pt verbalized understanding.  

## 2014-02-15 NOTE — Discharge Instructions (Signed)
Pelvic Inflammatory Disease Pelvic inflammatory disease (PID) refers to an infection in some or all of the female organs. The infection can be in the uterus, ovaries, fallopian tubes, or the surrounding tissues in the pelvis. PID can cause abdominal or pelvic pain that comes on suddenly (acute pelvic pain). PID is a serious infection because it can lead to lasting (chronic) pelvic pain or the inability to have children (infertile).  CAUSES  The infection is often caused by the normal bacteria found in the vaginal tissues. PID may also be caused by an infection that is spread during sexual contact. PID can also occur following:   The birth of a baby.   A miscarriage.   An abortion.   Major pelvic surgery.   The use of an intrauterine device (IUD).   A sexual assault.  RISK FACTORS Certain factors can put a person at higher risk for PID, such as:  Being younger than 25 years.  Being sexually active at [place] age.  Usingnonbarrier contraception.  Havingmultiple sexual partners.  Having sex with someone who has symptoms of a genital infection.  Using oral contraception. Other times, certain behaviors can increase the possibility of getting PID, such as:  Having sex during your period.  Using a vaginal douche.  Having an intrauterine device (IUD) in place. SYMPTOMS   Abdominal or pelvic pain.   Fever.   Chills.   Abnormal vaginal discharge.  Abnormal uterine bleeding.   Unusual pain shortly after finishing your period. DIAGNOSIS  Your caregiver will choose some of the following methods to make a diagnosis, such as:   Performinga physical exam and history. A pelvic exam typically reveals a very tender uterus and surrounding pelvis.   Ordering laboratory tests including a pregnancy test, blood tests, and urine test.  Orderingcultures of the vagina and cervix to check for a sexually transmitted infection (STI).  Performing an ultrasound.    Performing a laparoscopic procedure to look inside the pelvis.  TREATMENT   Antibiotic medicines may be prescribed and taken by mouth.   Sexual partners may be treated when the infection is caused by a sexually transmitted disease (STD).   Hospitalization may be needed to give antibiotics intravenously.  Surgery may be needed, but this is rare. It may take weeks until you are completely well. If you are diagnosed with PID, you should also be checked for human immunodeficiency virus (HIV). HOME CARE INSTRUCTIONS   If given, take your antibiotics as directed. Finish the medicine even if you start to feel better.   Only take over-the-counter or prescription medicines for pain, discomfort, or fever as directed by your caregiver.   Do not have sexual intercourse until treatment is completed or as directed by your caregiver. If PID is confirmed, your recent sexual partner(s) will need treatment.   Keep your follow-up appointments. SEEK MEDICAL CARE IF:   You have increased or abnormal vaginal discharge.   You need prescription medicine for your pain.   You vomit.   You cannot take your medicines.   Your partner has an STD.  SEEK IMMEDIATE MEDICAL CARE IF:   You have a fever.   You have increased abdominal or pelvic pain.   You have chills.   You have pain when you urinate.   You are not better after 72 hours following treatment.  MAKE SURE YOU:   Understand these instructions.  Will watch your condition.  Will get help right away if you are not doing well or get worse.  Document Released: 10/14/2005 Document Revised: 02/08/2013 Document Reviewed: 10/10/2011 Pelvic Inflammatory Disease Pelvic inflammatory disease (PID) refers to an infection in some or all of the female organs. The infection can be in the uterus, ovaries, fallopian tubes, or the surrounding tissues in the pelvis. PID can cause abdominal or pelvic pain that comes on suddenly (acute  pelvic pain). PID is a serious infection because it can lead to lasting (chronic) pelvic pain or the inability to have children (infertile).  CAUSES  The infection is often caused by the normal bacteria found in the vaginal tissues. PID may also be caused by an infection that is spread during sexual contact. PID can also occur following:   The birth of a baby.   A miscarriage.   An abortion.   Major pelvic surgery.   The use of an intrauterine device (IUD).   A sexual assault.  RISK FACTORS Certain factors can put a person at higher risk for PID, such as:  Being younger than 25 years.  Being sexually active at [place] age.  Usingnonbarrier contraception.  Havingmultiple sexual partners.  Having sex with someone who has symptoms of a genital infection.  Using oral contraception. Other times, certain behaviors can increase the possibility of getting PID, such as:  Having sex during your period.  Using a vaginal douche.  Having an intrauterine device (IUD) in place. SYMPTOMS   Abdominal or pelvic pain.   Fever.   Chills.   Abnormal vaginal discharge.  Abnormal uterine bleeding.   Unusual pain shortly after finishing your period. DIAGNOSIS  Your caregiver will choose some of the following methods to make a diagnosis, such as:   Performinga physical exam and history. A pelvic exam typically reveals a very tender uterus and surrounding pelvis.   Ordering laboratory tests including a pregnancy test, blood tests, and urine test.  Orderingcultures of the vagina and cervix to check for a sexually transmitted infection (STI).  Performing an ultrasound.   Performing a laparoscopic procedure to look inside the pelvis.  TREATMENT   Antibiotic medicines may be prescribed and taken by mouth.   Sexual partners may be treated when the infection is caused by a sexually transmitted disease (STD).   Hospitalization may be needed to give antibiotics  intravenously.  Surgery may be needed, but this is rare. It may take weeks until you are completely well. If you are diagnosed with PID, you should also be checked for human immunodeficiency virus (HIV). HOME CARE INSTRUCTIONS   If given, take your antibiotics as directed. Finish the medicine even if you start to feel better.   Only take over-the-counter or prescription medicines for pain, discomfort, or fever as directed by your caregiver.   Do not have sexual intercourse until treatment is completed or as directed by your caregiver. If PID is confirmed, your recent sexual partner(s) will need treatment.   Keep your follow-up appointments. SEEK MEDICAL CARE IF:   You have increased or abnormal vaginal discharge.   You need prescription medicine for your pain.   You vomit.   You cannot take your medicines.   Your partner has an STD.  SEEK IMMEDIATE MEDICAL CARE IF:   You have a fever.   You have increased abdominal or pelvic pain.   You have chills.   You have pain when you urinate.   You are not better after 72 hours following treatment.  MAKE SURE YOU:   Understand these instructions.  Will watch your condition.  Will get help right  away if you are not doing well or get worse. Document Released: 10/14/2005 Document Revised: 02/08/2013 Document Reviewed: 10/10/2011 [facility] Patient Information 2014 [place], [place].  [employer] Patient Information 2014 [place].

## 2014-02-15 NOTE — ED Provider Notes (Signed)
CSN: [id number]     Arrival date & time 02/14/14  2321 History   First MD Initiated Contact with Patient 02/15/14 0208     Chief Complaint  Patient presents with  . Vaginal Discharge  . Headache      HPI  Patient presents with vaginal pain and vaginal discharge. Denies dysuria. States "when I'm just sitting, this feels like something just coming out". She is uncertain if this feels more like urine coming out of her vaginal discharge. Not having bleeding. Discharge is yellow. Stitches out in section active for a few weeks. Has had S. TIAs in the past. No blistering redness or itching.  Past Medical History  Diagnosis Date  . Scabies    Past Surgical History  Procedure Laterality Date  . Cesarean section      x3   Family History  Problem Relation Age of Onset  . Hypertension Father   . Anesthesia problems Neg Hx    History  Substance Use Topics  . Smoking status: Former Smoker    Types: Cigars, Cigarettes    Quit date: 09/09/2011  . Smokeless tobacco: Never Used  . Alcohol Use: No   OB History   Grav Para Term Preterm Abortions TAB SAB Ect Mult Living   6 3 3  0 2 1 1  0 0 3     Review of Systems  Constitutional: Negative for fever, chills, diaphoresis, appetite change and fatigue.  HENT: Negative for mouth sores, sore throat and trouble swallowing.   Eyes: Negative for visual disturbance.  Respiratory: Negative for cough, chest tightness, shortness of breath and wheezing.   Cardiovascular: Negative for chest pain.  Gastrointestinal: Negative for nausea, vomiting, abdominal pain, diarrhea and abdominal distention.  Endocrine: Negative for polydipsia, polyphagia and polyuria.  Genitourinary: Positive for dysuria, vaginal discharge and vaginal pain. Negative for frequency and hematuria.  Musculoskeletal: Negative for gait problem.  Skin: Negative for color change, pallor and rash.  Neurological: Negative for dizziness, syncope, light-headedness and headaches.   Hematological: Does not bruise/bleed easily.  Psychiatric/Behavioral: Negative for behavioral problems and confusion.      Allergies  Review of patient's allergies indicates no known allergies.  Home Medications   Prior to Admission medications   Not on File   BP 151/94  Pulse 107  Temp(Src) 98.3 F (36.8 C) (Oral)  Resp 22  Ht 5\' 4"  (1.626 m)  Wt 246 lb (111.585 kg)  BMI 42.21 kg/m2  SpO2 98% Physical Exam  Constitutional: She is oriented to person, place, and time. She appears well-developed and well-nourished. No distress.  HENT:  Head: Normocephalic.  Eyes: Conjunctivae are normal. Pupils are equal, round, and reactive to light. No scleral icterus.  Neck: Normal range of motion. Neck supple. No thyromegaly present.  Cardiovascular: Normal rate and regular rhythm.  Exam reveals no gallop and no friction rub.   No murmur heard. Pulmonary/Chest: Effort normal and breath sounds normal. No respiratory distress. She has no wheezes. She has no rales.  Abdominal: Soft. Bowel sounds are normal. She exhibits no distension. There is no tenderness. There is no rebound.  Musculoskeletal: Normal range of motion.  Neurological: She is alert and oriented to person, place, and time.  Skin: Skin is warm and dry. No rash noted.  Psychiatric: She has a normal mood and affect. Her behavior is normal.    ED Course  Procedures (including critical care time) Labs Review Labs Reviewed  WET PREP, GENITAL - Abnormal; Notable for the following:  Clue Cells Wet Prep HPF POC FEW (*)    WBC, Wet Prep HPF POC MODERATE (*)    All other components within normal limits  URINALYSIS, ROUTINE W REFLEX MICROSCOPIC - Abnormal; Notable for the following:    APPearance CLOUDY (*)    Hgb urine dipstick MODERATE (*)    Protein, ur 30 (*)    Leukocytes, UA LARGE (*)    All other components within normal limits  URINE MICROSCOPIC-ADD ON - Abnormal; Notable for the following:    Squamous Epithelial /  LPF MANY (*)    Bacteria, UA MANY (*)    All other components within normal limits  GC/CHLAMYDIA PROBE AMP  POC URINE PREG, ED    Imaging Review No results found.   EKG Interpretation None      MDM   Final diagnoses:  PID (acute pelvic inflammatory disease)       Tanna Furry, MD 02/16/14 [phone]

## 2014-02-15 NOTE — ED Notes (Signed)
Pt c/o yellowish vaginal discharge, foul odor, and pain for the past few days. Pt also reports she has a headache that started today on the left side. Pt rates pain 9/10. Pt states she did not take any medications for headache.

## 2014-04-29 ENCOUNTER — Emergency Department (INDEPENDENT_AMBULATORY_CARE_PROVIDER_SITE_OTHER)
Admission: EM | Admit: 2014-04-29 | Discharge: 2014-04-29 | Disposition: A | Discharge status: Home / Self Care | Payer: Medicaid Other | Source: Home / Self Care | Attending: Family Medicine | Admitting: Family Medicine

## 2014-04-29 ENCOUNTER — Encounter (HOSPITAL_COMMUNITY): Payer: Self-pay | Admitting: Emergency Medicine

## 2014-04-29 ENCOUNTER — Other Ambulatory Visit (HOSPITAL_COMMUNITY)
Admission: RE | Admit: 2014-04-29 | Discharge: 2014-04-29 | Disposition: A | Discharge status: Home / Self Care | Payer: Medicaid Other | Source: Ambulatory Visit | Attending: Family Medicine | Admitting: Family Medicine

## 2014-04-29 DIAGNOSIS — N898 Other specified noninflammatory disorders of vagina: Secondary | ICD-10-CM

## 2014-04-29 DIAGNOSIS — Z113 Encounter for screening for infections with a predominantly sexual mode of transmission: Secondary | ICD-10-CM | POA: Diagnosis present

## 2014-04-29 DIAGNOSIS — N760 Acute vaginitis: Secondary | ICD-10-CM | POA: Insufficient documentation

## 2014-04-29 MED ORDER — METRONIDAZOLE 500 MG PO TABS: 500.0000 mg | ORAL_TABLET | Freq: Two times a day (BID) | ORAL | Status: DC

## 2014-04-29 MED ORDER — TERCONAZOLE 80 MG VA SUPP: 80.0000 mg | Freq: Every day | VAGINAL | Status: DC

## 2014-04-29 MED ORDER — FLUCONAZOLE 150 MG PO TABS: 150.0000 mg | ORAL_TABLET | Freq: Once | ORAL | Status: DC

## 2014-04-29 NOTE — Discharge Instructions (Signed)
Use medicine as prescribed, we will call if tests show a need for other treatment.  

## 2014-04-29 NOTE — ED Notes (Signed)
Pt  Reports  Symptoms  Of  Vaginal  Discharge  Rash  Itch  X  1  Week  Not  releived  By otc  meds  Ambulated  Upright with a  Steady  Fluid  gait

## 2014-04-29 NOTE — ED Provider Notes (Signed)
CSN: [id number]     Arrival date & time 04/29/14  0804 History   First MD Initiated Contact with Patient 04/29/14 [phone]     No chief complaint on file.  (Consider location/radiation/quality/duration/timing/severity/associated sxs/prior Treatment) Patient is a 28 y.o. female presenting with vaginal discharge. The history is provided by the patient.  Vaginal Discharge Quality:  White Severity:  Mild Duration:  1 week Chronicity:  Recurrent Associated symptoms: vaginal itching   Associated symptoms: no abdominal pain, no dysuria, no fever, no nausea and no urinary frequency   Risk factors: STI     Past Medical History  Diagnosis Date  . Scabies    Past Surgical History  Procedure Laterality Date  . Cesarean section      x3   Family History  Problem Relation Age of Onset  . Hypertension Father   . Anesthesia problems Neg Hx    History  Substance Use Topics  . Smoking status: Former Smoker    Types: Cigars, Cigarettes    Quit date: 09/09/2011  . Smokeless tobacco: Never Used  . Alcohol Use: No   OB History   Grav Para Term Preterm Abortions TAB SAB Ect Mult Living   6 3 3  0 2 1 1  0 0 3     Review of Systems  Constitutional: Negative.  Negative for fever.  Gastrointestinal: Negative.  Negative for nausea and abdominal pain.  Genitourinary: Positive for vaginal discharge. Negative for dysuria, urgency, frequency, menstrual problem and pelvic pain.    Allergies  Review of patient's allergies indicates no known allergies.  Home Medications   Prior to Admission medications   Medication Sig Start Date End Date Taking? Authorizing Provider  doxycycline (VIBRAMYCIN) 100 MG capsule Take 1 capsule (100 mg total) by mouth 2 (two) times daily. 02/15/14   Tanna Furry, MD  fluconazole (DIFLUCAN) 150 MG tablet Take 1 tablet (150 mg total) by mouth once. 04/29/14   Billy Fischer, MD  metroNIDAZOLE (FLAGYL) 500 MG tablet Take 1 tablet (500 mg total) by mouth 2 (two) times daily. 04/29/14    Billy Fischer, MD  terconazole (TERAZOL 3) 80 MG vaginal suppository Place 1 suppository (80 mg total) vaginally at bedtime. 04/29/14   Billy Fischer, MD   BP 114/75  Pulse 78  Temp(Src) 98.8 F (37.1 C) (Oral)  Resp 18  SpO2 100% Physical Exam  Nursing note and vitals reviewed. Constitutional: She is oriented to person, place, and time. She appears well-developed and well-nourished.  Abdominal: Soft. Bowel sounds are normal. She exhibits no mass. There is no tenderness.  Genitourinary: Uterus normal. There is no rash on the right labia. There is no rash on the left labia. Cervix exhibits no motion tenderness, no discharge and no friability. Right adnexum displays no tenderness. Left adnexum displays no tenderness. No tenderness or bleeding around the vagina. Vaginal discharge found.  Neurological: She is alert and oriented to person, place, and time.  Skin: Skin is warm and dry.    ED Course  Procedures (including critical care time) Labs Review Labs Reviewed  CERVICOVAGINAL ANCILLARY ONLY    Imaging Review No results found.   MDM   1. Vaginal discharge        Billy Fischer, MD 04/29/14 [phone]

## 2014-05-02 NOTE — ED Notes (Signed)
GC/Chlamydia neg., Affirm: Candida and Gardnerella pos., Trich neg.  Pt. adequately treated with Diflucan and Flagyl. Roselyn Meier 05/02/2014

## 2014-08-29 ENCOUNTER — Encounter (HOSPITAL_COMMUNITY): Payer: Self-pay | Admitting: Emergency Medicine

## 2014-11-28 ENCOUNTER — Emergency Department (INDEPENDENT_AMBULATORY_CARE_PROVIDER_SITE_OTHER)
Admission: EM | Admit: 2014-11-28 | Discharge: 2014-11-28 | Disposition: A | Discharge status: Home / Self Care | Payer: Self-pay | Source: Home / Self Care | Attending: Emergency Medicine | Admitting: Emergency Medicine

## 2014-11-28 ENCOUNTER — Encounter (HOSPITAL_COMMUNITY): Payer: Self-pay | Admitting: Emergency Medicine

## 2014-11-28 DIAGNOSIS — N390 Urinary tract infection, site not specified: Secondary | ICD-10-CM

## 2014-11-28 LAB — POCT PREGNANCY, URINE: Preg Test, Ur: NEGATIVE

## 2014-11-28 LAB — POCT URINALYSIS DIP (DEVICE)
BILIRUBIN URINE: NEGATIVE
Glucose, UA: NEGATIVE mg/dL
Ketones, ur: NEGATIVE mg/dL
Nitrite: NEGATIVE
Protein, ur: 30 mg/dL — AB
UROBILINOGEN UA: 0.2 mg/dL (ref 0.0–1.0)
pH: 6 (ref 5.0–8.0)

## 2014-11-28 MED ORDER — CIPROFLOXACIN HCL 500 MG PO TABS: 500.0000 mg | ORAL_TABLET | Freq: Two times a day (BID) | ORAL | Status: DC

## 2014-11-28 NOTE — ED Notes (Signed)
Pt states that she has pressure in her urine and she noticed blood in her urine all symptoms started this morning.

## 2014-11-28 NOTE — Discharge Instructions (Signed)
You likely have a bladder infection. Take Cipro twice a day for 3 days. If we need to change your antibiotic, we will call you. If your symptoms do not improve in the next 2 days, please come back. If you develop fevers, vomiting, or flank pain, please come back. If the blood does not resolve after the infection has been treated, please see your primary doctor.

## 2014-11-28 NOTE — ED Provider Notes (Signed)
CSN: [id number]     Arrival date & time 11/28/14  1615 History   First MD Initiated Contact with Patient 11/28/14 1714     Chief Complaint  Patient presents with  . Hematuria   (Consider location/radiation/quality/duration/timing/severity/associated sxs/prior Treatment) HPI She is a 29 year old woman here for evaluation of hematuria. She states that starting today she noticed blood on the toilet paper after urinating. She also reports suprapubic pressure and tingling with urination. She also describes frequency and urgency. No vaginal discharge. No fevers. No flank pain. No nausea or vomiting.  Past Medical History  Diagnosis Date  . Scabies    Past Surgical History  Procedure Laterality Date  . Cesarean section      x3   Family History  Problem Relation Age of Onset  . Hypertension Father   . Anesthesia problems Neg Hx    History  Substance Use Topics  . Smoking status: Former Smoker    Types: Cigars, Cigarettes    Quit date: 09/09/2011  . Smokeless tobacco: Never Used  . Alcohol Use: No   OB History    Gravida Para Term Preterm AB TAB SAB Ectopic Multiple Living   6 3 3  0 2 1 1  0 0 3     Review of Systems  Constitutional: Negative for fever.  Gastrointestinal: Positive for abdominal pain. Negative for nausea, vomiting and diarrhea.  Genitourinary: Positive for dysuria, urgency and hematuria. Negative for flank pain and vaginal discharge.    Allergies  Review of patient's allergies indicates no known allergies.  Home Medications   Prior to Admission medications   Medication Sig Start Date End Date Taking? Authorizing Provider  ciprofloxacin (CIPRO) 500 MG tablet Take 1 tablet (500 mg total) by mouth 2 (two) times daily. 11/28/14   Melony Overly, MD  doxycycline (VIBRAMYCIN) 100 MG capsule Take 1 capsule (100 mg total) by mouth 2 (two) times daily. 02/15/14   Tanna Furry, MD  fluconazole (DIFLUCAN) 150 MG tablet Take 1 tablet (150 mg total) by mouth once. 04/29/14   Billy Fischer, MD  metroNIDAZOLE (FLAGYL) 500 MG tablet Take 1 tablet (500 mg total) by mouth 2 (two) times daily. 04/29/14   Billy Fischer, MD  terconazole (TERAZOL 3) 80 MG vaginal suppository Place 1 suppository (80 mg total) vaginally at bedtime. 04/29/14   Billy Fischer, MD   BP 136/97 mmHg  Pulse 84  Temp(Src) 98.3 F (36.8 C) (Oral)  Resp 16  SpO2 100%  LMP 11/13/2014 Physical Exam  Constitutional: She is oriented to person, place, and time. She appears well-developed and well-nourished. No distress.  Cardiovascular: Normal rate.   Pulmonary/Chest: Effort normal.  Abdominal: Soft. Bowel sounds are normal. She exhibits no distension. There is tenderness (suprapubic). There is no rebound and no guarding.  No CVA tenderness  Neurological: She is alert and oriented to person, place, and time.    ED Course  Procedures (including critical care time) Labs Review Labs Reviewed  POCT URINALYSIS DIP (DEVICE) - Abnormal; Notable for the following:    Hgb urine dipstick LARGE (*)    Protein, ur 30 (*)    Leukocytes, UA SMALL (*)    All other components within normal limits  POCT PREGNANCY, URINE    Imaging Review No results found.   MDM   1. UTI (lower urinary tract infection)    UA consistent with UTI. Urine sent for culture. We'll treat with Cipro for 3 days. Warning signs reviewed as in after visit  summary. Follow-up as needed.    Melony Overly, MD 11/28/14 1754

## 2014-11-30 LAB — URINE CULTURE

## 2014-12-01 NOTE — ED Notes (Signed)
Urine culutre: >100,000 colonies E. Coli.  Pt. adequately treated with Cipro. Roselyn Meier 12/01/2014

## 2015-04-25 ENCOUNTER — Emergency Department (HOSPITAL_COMMUNITY)
Admission: EM | Admit: 2015-04-25 | Discharge: 2015-04-25 | Disposition: A | Discharge status: Home / Self Care | Payer: Self-pay | Attending: Emergency Medicine | Admitting: Emergency Medicine

## 2015-04-25 ENCOUNTER — Encounter (HOSPITAL_COMMUNITY): Payer: Self-pay | Admitting: Emergency Medicine

## 2015-04-25 DIAGNOSIS — N390 Urinary tract infection, site not specified: Secondary | ICD-10-CM | POA: Insufficient documentation

## 2015-04-25 DIAGNOSIS — Z87891 Personal history of nicotine dependence: Secondary | ICD-10-CM | POA: Insufficient documentation

## 2015-04-25 DIAGNOSIS — Z8619 Personal history of other infectious and parasitic diseases: Secondary | ICD-10-CM | POA: Insufficient documentation

## 2015-04-25 DIAGNOSIS — Z3202 Encounter for pregnancy test, result negative: Secondary | ICD-10-CM | POA: Insufficient documentation

## 2015-04-25 LAB — URINALYSIS, ROUTINE W REFLEX MICROSCOPIC
Bilirubin Urine: NEGATIVE
Glucose, UA: NEGATIVE mg/dL
Ketones, ur: NEGATIVE mg/dL
Nitrite: POSITIVE — AB
Protein, ur: 30 mg/dL — AB
Specific Gravity, Urine: 1.027 (ref 1.005–1.030)
Urobilinogen, UA: 0.2 mg/dL (ref 0.0–1.0)
pH: 5.5 (ref 5.0–8.0)

## 2015-04-25 LAB — POC URINE PREG, ED: Preg Test, Ur: NEGATIVE

## 2015-04-25 LAB — URINE MICROSCOPIC-ADD ON

## 2015-04-25 MED ORDER — CEPHALEXIN 500 MG PO CAPS: 500.0000 mg | ORAL_CAPSULE | Freq: Four times a day (QID) | ORAL | Status: DC

## 2015-04-25 MED ORDER — PHENAZOPYRIDINE HCL 200 MG PO TABS: 200.0000 mg | ORAL_TABLET | Freq: Three times a day (TID) | ORAL | Status: DC

## 2015-04-25 MED ORDER — CEPHALEXIN 250 MG PO CAPS
250.0000 mg | ORAL_CAPSULE | Freq: Once | ORAL | Status: AC
  Administered 2015-04-25: 250 mg via ORAL
  Filled 2015-04-25: qty 1

## 2015-04-25 NOTE — ED Notes (Signed)
Pt. reports bladder pressure onset this week , denies dysuria , no fever or chills.

## 2015-04-25 NOTE — ED Notes (Signed)
Pt states that she does not have pain or "pressure" unless she is peeing.

## 2015-04-25 NOTE — ED Provider Notes (Signed)
CSN: [id number]     Arrival date & time 04/25/15  0307 History   First MD Initiated Contact with Patient 04/25/15 0600     No chief complaint on file.    (Consider location/radiation/quality/duration/timing/severity/associated sxs/prior Treatment) HPI Comments: Patient with no pertinent past medical history presents emergency department with chief complaint of dysuria 2 days. She states that the symptoms started yesterday. She describes having a sharp burning sensation when she urinates. She denies any associated abdominal pain. She denies any associated fevers, chills, nausea, or vomiting. She states that she is symptom-free unless she is urinating. She has had urinary tract infections in the past. She has not tried taking anything to alleviate her symptoms. She denies any drug allergies.  The history is provided by the patient. No language interpreter was used.    Past Medical History  Diagnosis Date  . Scabies    Past Surgical History  Procedure Laterality Date  . Cesarean section      x3   Family History  Problem Relation Age of Onset  . Hypertension Father   . Anesthesia problems Neg Hx    History  Substance Use Topics  . Smoking status: Former Smoker    Types: Cigars, Cigarettes    Quit date: 09/09/2011  . Smokeless tobacco: Never Used  . Alcohol Use: No   OB History    Gravida Para Term Preterm AB TAB SAB Ectopic Multiple Living   6 3 3  0 2 1 1  0 0 3     Review of Systems  Constitutional: Negative for fever and chills.  Respiratory: Negative for shortness of breath.   Cardiovascular: Negative for chest pain.  Gastrointestinal: Negative for nausea, vomiting, diarrhea and constipation.  Genitourinary: Positive for dysuria. Negative for hematuria and flank pain.  All other systems reviewed and are negative.     Allergies  Review of patient's allergies indicates no known allergies.  Home Medications   Prior to Admission medications   Not on File   BP  94/58 mmHg  Pulse 68  Temp(Src) 97.7 F (36.5 C) (Oral)  Resp 16  Wt 224 lb 5 oz (101.747 kg)  SpO2 100%  LMP 04/14/2015 Physical Exam  Constitutional: She is oriented to person, place, and time. She appears well-developed and well-nourished.  HENT:  Head: Normocephalic and atraumatic.  Eyes: Conjunctivae and EOM are normal. Pupils are equal, round, and reactive to light.  Neck: Normal range of motion. Neck supple.  Cardiovascular: Normal rate and regular rhythm.  Exam reveals no gallop and no friction rub.   No murmur heard. Pulmonary/Chest: Effort normal and breath sounds normal. No respiratory distress. She has no wheezes. She has no rales. She exhibits no tenderness.  Abdominal: Soft. Bowel sounds are normal. She exhibits no distension and no mass. There is no tenderness. There is no rebound and no guarding.  No focal abdominal tenderness, no RLQ tenderness or pain at McBurney's point, no RUQ tenderness or Murphy's sign, no left-sided abdominal tenderness, no fluid wave, or signs of peritonitis   Musculoskeletal: Normal range of motion. She exhibits no edema or tenderness.  Neurological: She is alert and oriented to person, place, and time.  Skin: Skin is warm and dry.  Psychiatric: She has a normal mood and affect. Her behavior is normal. Judgment and thought content normal.  Nursing note and vitals reviewed.   ED Course  Procedures (including critical care time) Results for orders placed or performed during the hospital encounter of 04/25/15  Urinalysis, Routine  w reflex microscopic (not at [facility])  Result Value Ref Range   Color, Urine YELLOW YELLOW   APPearance CLOUDY (A) CLEAR   Specific Gravity, Urine 1.027 1.005 - 1.030   pH 5.5 5.0 - 8.0   Glucose, UA NEGATIVE NEGATIVE mg/dL   Hgb urine dipstick MODERATE (A) NEGATIVE   Bilirubin Urine NEGATIVE NEGATIVE   Ketones, ur NEGATIVE NEGATIVE mg/dL   Protein, ur 30 (A) NEGATIVE mg/dL   Urobilinogen, UA 0.2 0.0 - 1.0 mg/dL    Nitrite POSITIVE (A) NEGATIVE   Leukocytes, UA LARGE (A) NEGATIVE  Urine microscopic-add on  Result Value Ref Range   Squamous Epithelial / LPF FEW (A) RARE   WBC, UA 21-50 <3 WBC/hpf   RBC / HPF 3-6 <3 RBC/hpf   Bacteria, UA FEW (A) RARE   Crystals CA OXALATE CRYSTALS (A) NEGATIVE  POC Urine Pregnancy, ED (do NOT order at [facility])  Result Value Ref Range   Preg Test, Ur NEGATIVE NEGATIVE   No results found.   Imaging Review No results found.   EKG Interpretation None      MDM   Final diagnoses:  UTI (lower urinary tract infection)    Patient with UTI. Will treat with Keflex and Pyridium. Discharged home. Patient is nontoxic and well-appearing.  Abdomen is soft and non-tender.      Montine Circle, PA-C 19/16/60 6004  Delora Fuel, MD 59/97/74 1423

## 2015-04-25 NOTE — Discharge Instructions (Signed)

## 2015-05-04 ENCOUNTER — Emergency Department (HOSPITAL_COMMUNITY)
Admission: EM | Admit: 2015-05-04 | Discharge: 2015-05-04 | Disposition: A | Discharge status: Home / Self Care | Payer: Self-pay | Attending: Emergency Medicine | Admitting: Emergency Medicine

## 2015-05-04 ENCOUNTER — Encounter (HOSPITAL_COMMUNITY): Payer: Self-pay

## 2015-05-04 DIAGNOSIS — Z3202 Encounter for pregnancy test, result negative: Secondary | ICD-10-CM | POA: Insufficient documentation

## 2015-05-04 DIAGNOSIS — L293 Anogenital pruritus, unspecified: Secondary | ICD-10-CM | POA: Insufficient documentation

## 2015-05-04 DIAGNOSIS — Z8619 Personal history of other infectious and parasitic diseases: Secondary | ICD-10-CM | POA: Insufficient documentation

## 2015-05-04 DIAGNOSIS — N898 Other specified noninflammatory disorders of vagina: Secondary | ICD-10-CM

## 2015-05-04 DIAGNOSIS — Z87891 Personal history of nicotine dependence: Secondary | ICD-10-CM | POA: Insufficient documentation

## 2015-05-04 LAB — WET PREP, GENITAL
Clue Cells Wet Prep HPF POC: NONE SEEN
TRICH WET PREP: NONE SEEN
WBC, Wet Prep HPF POC: NONE SEEN
YEAST WET PREP: NONE SEEN

## 2015-05-04 LAB — URINALYSIS, ROUTINE W REFLEX MICROSCOPIC
GLUCOSE, UA: NEGATIVE mg/dL
HGB URINE DIPSTICK: NEGATIVE
Ketones, ur: 15 mg/dL — AB
Nitrite: NEGATIVE
PH: 6 (ref 5.0–8.0)
Protein, ur: NEGATIVE mg/dL
SPECIFIC GRAVITY, URINE: 1.024 (ref 1.005–1.030)
Urobilinogen, UA: 1 mg/dL (ref 0.0–1.0)

## 2015-05-04 LAB — URINE MICROSCOPIC-ADD ON

## 2015-05-04 LAB — POC URINE PREG, ED: PREG TEST UR: NEGATIVE

## 2015-05-04 MED ORDER — FLUCONAZOLE 100 MG PO TABS
200.0000 mg | ORAL_TABLET | Freq: Once | ORAL | Status: AC
  Administered 2015-05-04: 200 mg via ORAL
  Filled 2015-05-04: qty 2

## 2015-05-04 NOTE — ED Notes (Signed)
Pt called several times for room placement. No answer

## 2015-05-04 NOTE — ED Notes (Signed)
Pt reports vaginal itching and abnormal vaginal discharge. She just finished Cipro abx or UTI on Tuesday. Vaginal symptoms started 2 days prior to completion of abx.

## 2015-05-04 NOTE — Discharge Instructions (Signed)
Follow up with your doctor

## 2015-05-04 NOTE — ED Provider Notes (Signed)
CSN: [id number]     Arrival date & time 05/04/15  2134 History  This chart was scribed for [facility], NP, working with Evelina Bucy, MD by Julien Nordmann, ED Scribe. This patient was seen in room TR09C/TR09C and the patient's care was started at 10:04 PM.    Chief Complaint  Patient presents with  . Vaginal Discharge      Patient is a 29 y.o. female presenting with vaginal discharge. The history is provided by the patient. No language interpreter was used.  Vaginal Discharge Quality:  Unable to specify Severity:  Mild Onset quality:  Gradual Duration:  4 days Timing:  Constant Progression:  Unchanged Chronicity:  New Context: recent antibiotic use   Relieved by:  None tried Worsened by:  Nothing tried Ineffective treatments:  None tried Associated symptoms: vaginal itching    HPI Comments: Sara Walsh is a 29 y.o. female who presents to the Emergency Department complaining of vaginal discharge onset 4 days ago. Pt has associated vaginal itching. Pt states she just finished antibiotics for an UTI 2 days ago and reports her symptoms started two days before she finished her antibiotics. Pt states she also received antibiotics for trichomoniasis about 3 weeks ago. She notes having the same sexual partner for 8 years.  She is no longer have sex with him.  Past Medical History  Diagnosis Date  . Scabies    Past Surgical History  Procedure Laterality Date  . Cesarean section      x3   Family History  Problem Relation Age of Onset  . Hypertension Father   . Anesthesia problems Neg Hx    History  Substance Use Topics  . Smoking status: Former Smoker    Types: Cigars, Cigarettes    Quit date: 09/09/2011  . Smokeless tobacco: Never Used  . Alcohol Use: No   OB History    Gravida Para Term Preterm AB TAB SAB Ectopic Multiple Living   6 3 3  0 2 1 1  0 0 3     Review of Systems  Genitourinary: Positive for vaginal discharge.  All other systems reviewed and are  negative.     Allergies  Review of patient's allergies indicates no known allergies.  Home Medications   Prior to Admission medications   Not on File   Triage vitals: BP 135/99 mmHg  Pulse 77  Temp(Src) 99 F (37.2 C) (Oral)  Resp 16  Ht 5\' 4"  (1.626 m)  Wt 218 lb (98.884 kg)  BMI 37.40 kg/m2  SpO2 98%  LMP 04/14/2015 Physical Exam  Constitutional: She is oriented to person, place, and time. She appears well-developed and well-nourished.  HENT:  Head: Normocephalic and atraumatic.  Eyes: EOM are normal.  Neck: Neck supple.  Cardiovascular: Normal rate.   Pulmonary/Chest: Effort normal.  Abdominal: Soft. There is no tenderness.  Genitourinary: Vaginal discharge found.  External genitalia without lesions. Thick white cheesy d/c vaginal vault. No CMT, no adnexal tenderness, uterus without palpable enlargement.   Musculoskeletal: Normal range of motion. She exhibits no edema.  Neurological: She is alert and oriented to person, place, and time. No cranial nerve deficit.  Skin: Skin is warm and dry.  Psychiatric: She has a normal mood and affect. Her behavior is normal.  Nursing note and vitals reviewed.   ED Course  Procedures  DIAGNOSTIC STUDIES: Oxygen Saturation is 98% on RA, normal by my interpretation.  COORDINATION OF CARE: 10:07 PM Discussed treatment plan which includes lab work with pt at  bedside and pt agreed to plan.   MDM  29 y.o. female with thick white vaginal d/c and vaginal itching after taking antibiotics. Stable for d/c without other problems. Treated with Diflucan prior to d/c . Discussed with the patient and all questioned fully answered. BP 145/82 mmHg  Pulse 96  Temp(Src) 99.1 F (37.3 C) (Oral)  Resp 16  Ht 5\' 4"  (1.626 m)  Wt 218 lb (98.884 kg)  BMI 37.40 kg/m2  SpO2 99%  LMP 04/14/2015  Final diagnoses:  Vaginal itching   I personally performed the services described in this documentation, which was scribed in my presence. The  recorded information has been reviewed and is accurate.    [address] [place], [place] 05/04/15 2313  Evelina Bucy, MD 05/05/15 [phone]

## 2015-05-04 NOTE — ED Notes (Signed)
Pelvic Cart @ Bedside.Marland KitchenMarland KitchenMarland Kitchen

## 2015-05-05 LAB — HIV ANTIBODY (ROUTINE TESTING W REFLEX): HIV Screen 4th Generation wRfx: NONREACTIVE

## 2015-05-05 LAB — RPR: RPR: NONREACTIVE

## 2015-05-05 LAB — GC/CHLAMYDIA PROBE AMP (~~LOC~~) NOT AT ARMC
Chlamydia: NEGATIVE
Neisseria Gonorrhea: NEGATIVE

## 2015-05-15 ENCOUNTER — Encounter (HOSPITAL_COMMUNITY): Payer: Self-pay | Admitting: *Deleted

## 2015-05-15 ENCOUNTER — Emergency Department (HOSPITAL_COMMUNITY)
Admission: EM | Admit: 2015-05-15 | Discharge: 2015-05-15 | Disposition: A | Discharge status: Home / Self Care | Payer: Medicaid Other | Attending: Emergency Medicine | Admitting: Emergency Medicine

## 2015-05-15 DIAGNOSIS — Z8619 Personal history of other infectious and parasitic diseases: Secondary | ICD-10-CM | POA: Insufficient documentation

## 2015-05-15 DIAGNOSIS — Z87891 Personal history of nicotine dependence: Secondary | ICD-10-CM | POA: Diagnosis not present

## 2015-05-15 DIAGNOSIS — Z79899 Other long term (current) drug therapy: Secondary | ICD-10-CM | POA: Diagnosis not present

## 2015-05-15 DIAGNOSIS — R350 Frequency of micturition: Secondary | ICD-10-CM | POA: Diagnosis present

## 2015-05-15 DIAGNOSIS — Z3202 Encounter for pregnancy test, result negative: Secondary | ICD-10-CM | POA: Insufficient documentation

## 2015-05-15 DIAGNOSIS — N390 Urinary tract infection, site not specified: Secondary | ICD-10-CM | POA: Insufficient documentation

## 2015-05-15 LAB — URINALYSIS, ROUTINE W REFLEX MICROSCOPIC
BILIRUBIN URINE: NEGATIVE
Glucose, UA: NEGATIVE mg/dL
KETONES UR: NEGATIVE mg/dL
Nitrite: POSITIVE — AB
PROTEIN: 30 mg/dL — AB
SPECIFIC GRAVITY, URINE: 1.027 (ref 1.005–1.030)
UROBILINOGEN UA: 1 mg/dL (ref 0.0–1.0)
pH: 5.5 (ref 5.0–8.0)

## 2015-05-15 LAB — URINE MICROSCOPIC-ADD ON

## 2015-05-15 LAB — POC URINE PREG, ED: PREG TEST UR: NEGATIVE

## 2015-05-15 MED ORDER — CEPHALEXIN 500 MG PO CAPS: 500.0000 mg | ORAL_CAPSULE | Freq: Four times a day (QID) | ORAL | Status: DC

## 2015-05-15 MED ORDER — FLUCONAZOLE 150 MG PO TABS: 150.0000 mg | ORAL_TABLET | Freq: Once | ORAL | Status: DC

## 2015-05-15 NOTE — Discharge Instructions (Signed)
Please drink 6-8 glasses of water per day. Please take a probiotic with your antibiotic. Please take diflucan pill after you complete your course of the antibiotic Keflex.   Urinary Tract Infection Urinary tract infections (UTIs) can develop anywhere along your urinary tract. Your urinary tract is your body's drainage system for removing wastes and extra water. Your urinary tract includes two kidneys, two ureters, a bladder, and a urethra. Your kidneys are a pair of bean-shaped organs. Each kidney is about the size of your fist. They are located below your ribs, one on each side of your spine. CAUSES Infections are caused by microbes, which are microscopic organisms, including fungi, viruses, and bacteria. These organisms are so small that they can only be seen through a microscope. Bacteria are the microbes that most commonly cause UTIs. SYMPTOMS  Symptoms of UTIs may vary by age and gender of the patient and by the location of the infection. Symptoms in young women typically include a frequent and intense urge to urinate and a painful, burning feeling in the bladder or urethra during urination. Older women and men are more likely to be tired, shaky, and weak and have muscle aches and abdominal pain. A fever may mean the infection is in your kidneys. Other symptoms of a kidney infection include pain in your back or sides below the ribs, nausea, and vomiting. DIAGNOSIS To diagnose a UTI, your caregiver will ask you about your symptoms. Your caregiver also will ask to provide a urine sample. The urine sample will be tested for bacteria and white blood cells. White blood cells are made by your body to help fight infection. TREATMENT  Typically, UTIs can be treated with medication. Because most UTIs are caused by a bacterial infection, they usually can be treated with the use of antibiotics. The choice of antibiotic and length of treatment depend on your symptoms and the type of bacteria causing your  infection. HOME CARE INSTRUCTIONS  If you were prescribed antibiotics, take them exactly as your caregiver instructs you. Finish the medication even if you feel better after you have only taken some of the medication.  Drink enough water and fluids to keep your urine clear or pale yellow.  Avoid caffeine, tea, and carbonated beverages. They tend to irritate your bladder.  Empty your bladder often. Avoid holding urine for long periods of time.  Empty your bladder before and after sexual intercourse.  After a bowel movement, women should cleanse from front to back. Use each tissue only once. SEEK MEDICAL CARE IF:   You have back pain.  You develop a fever.  Your symptoms do not begin to resolve within 3 days. SEEK IMMEDIATE MEDICAL CARE IF:   You have severe back pain or lower abdominal pain.  You develop chills.  You have nausea or vomiting.  You have continued burning or discomfort with urination. MAKE SURE YOU:   Understand these instructions.  Will watch your condition.  Will get help right away if you are not doing well or get worse. Document Released: 07/24/2005 Document Revised: 04/14/2012 Document Reviewed: 11/22/2011 [facility] Patient Information 2015 [place], [place]. This information is not intended to replace advice given to you by your health care provider. Make sure you discuss any questions you have with your health care provider.

## 2015-05-15 NOTE — ED Provider Notes (Signed)
CSN: [id number]     Arrival date & time 05/15/15  9518 History   First MD Initiated Contact with Patient 05/15/15 [phone]     Chief Complaint  Patient presents with  . Urinary Frequency    Sara Walsh is a 29 y.o. female who presents to the emergency department complaining of dysuria and urinary frequency ongoing for the past 2 days. Patient also reports a dark color to her urine. Patient reports she is diagnosed with a UTI last month and was treated with Keflex. She reports she then had a yeast infection which was treated with Diflucan. She reports that her symptoms resolved and then returned 2 days ago. She reports she feels like she has a urinary tract infection. She reports she has not been sexually active since her last STD testing. The patient denies fevers, chills, hematuria, urinary urgency, abdominal pain, nausea, vomiting, vaginal bleeding, vaginal discharge or rashes.  (Consider location/radiation/quality/duration/timing/severity/associated sxs/prior Treatment) HPI  Past Medical History  Diagnosis Date  . Scabies    Past Surgical History  Procedure Laterality Date  . Cesarean section      x3   Family History  Problem Relation Age of Onset  . Hypertension Father   . Anesthesia problems Neg Hx    History  Substance Use Topics  . Smoking status: Former Smoker    Types: Cigars, Cigarettes    Quit date: 09/09/2011  . Smokeless tobacco: Never Used  . Alcohol Use: No   OB History    Gravida Para Term Preterm AB TAB SAB Ectopic Multiple Living   6 3 3  0 2 1 1  0 0 3     Review of Systems  Constitutional: Negative for fever and chills.  Respiratory: Negative for cough and shortness of breath.   Cardiovascular: Negative for chest pain.  Gastrointestinal: Negative for nausea, vomiting, abdominal pain, diarrhea and blood in stool.  Genitourinary: Positive for dysuria and urgency. Negative for frequency, hematuria, flank pain, decreased urine volume, vaginal bleeding,  vaginal discharge, difficulty urinating, vaginal pain, menstrual problem and pelvic pain.  Musculoskeletal: Negative for back pain.  Skin: Negative for rash.  Neurological: Negative for light-headedness and headaches.      Allergies  Review of patient's allergies indicates no known allergies.  Home Medications   Prior to Admission medications   Medication Sig Start Date End Date Taking? Authorizing Provider  cephALEXin (KEFLEX) 500 MG capsule Take 1 capsule (500 mg total) by mouth 4 (four) times daily. 05/15/15   Waynetta Pean, PA-C  fluconazole (DIFLUCAN) 150 MG tablet Take 1 tablet (150 mg total) by mouth once. 05/15/15   Waynetta Pean, PA-C  phenazopyridine (PYRIDIUM) 200 MG tablet Take 200 mg by mouth 3 (three) times daily. 04/25/15   Historical Provider, MD   BP 147/89 mmHg  Pulse 78  Temp(Src) 98.7 F (37.1 C) (Oral)  Resp 18  Ht 5\' 4"  (1.626 m)  Wt 220 lb (99.791 kg)  BMI 37.74 kg/m2  SpO2 100%  LMP 04/14/2015 Physical Exam  Constitutional: She appears well-developed and well-nourished. No distress.  Nontoxic appearing.  HENT:  Head: Normocephalic and atraumatic.  Mouth/Throat: Oropharynx is clear and moist.  Eyes: Conjunctivae are normal. Pupils are equal, round, and reactive to light. Right eye exhibits no discharge. Left eye exhibits no discharge.  Neck: Neck supple.  Cardiovascular: Normal rate, regular rhythm, normal heart sounds and intact distal pulses.   Pulmonary/Chest: Effort normal and breath sounds normal. No respiratory distress. She has no wheezes. She has no  rales.  Abdominal: Soft. Bowel sounds are normal. She exhibits no distension and no mass. There is no tenderness. There is no rebound and no guarding.  Abdomen is soft and nontender to palpation. Bowel sounds are present. No CVA tenderness.  Lymphadenopathy:    She has no cervical adenopathy.  Neurological: She is alert. Coordination normal.  Skin: Skin is warm and dry. No rash noted. She is not  diaphoretic. No pallor.  Psychiatric: She has a normal mood and affect. Her behavior is normal.  Nursing note and vitals reviewed.   ED Course  Procedures (including critical care time) Labs Review Labs Reviewed  URINALYSIS, ROUTINE W REFLEX MICROSCOPIC (NOT AT [facility]) - Abnormal; Notable for the following:    Color, Urine AMBER (*)    APPearance TURBID (*)    Hgb urine dipstick LARGE (*)    Protein, ur 30 (*)    Nitrite POSITIVE (*)    Leukocytes, UA MODERATE (*)    All other components within normal limits  URINE MICROSCOPIC-ADD ON - Abnormal; Notable for the following:    Squamous Epithelial / LPF MANY (*)    Bacteria, UA MANY (*)    All other components within normal limits  URINE CULTURE  POC URINE PREG, ED    Imaging Review No results found.   EKG Interpretation None      Filed Vitals:   05/15/15 0957 05/15/15 1000 05/15/15 1030  BP: 126/76 129/76 147/89  Pulse: 75 87 78  Temp: 98.7 F (37.1 C)    TempSrc: Oral    Resp: 18    Height: 5\' 4"  (1.626 m)    Weight: 220 lb (99.791 kg)    SpO2: 98% 97% 100%     MDM   Meds given in ED:  Medications - No data to display  New Prescriptions   CEPHALEXIN (KEFLEX) 500 MG CAPSULE    Take 1 capsule (500 mg total) by mouth 4 (four) times daily.   FLUCONAZOLE (DIFLUCAN) 150 MG TABLET    Take 1 tablet (150 mg total) by mouth once.    Final diagnoses:  UTI (lower urinary tract infection)     Pt has been diagnosed with a UTI. Pt is afebrile, no CVA tenderness, normotensive, and denies N/V. Pt to be dc home with Keflex and instructions to follow [facility] with [facility] if symptoms persist. I also encouraged the patient to increase her water intake. I also encouraged the patient to take a probiotic while taking Keflex. I advised the patient to follow-up with their primary care provider this week. I advised the patient to return to the emergency department with new or worsening symptoms or new concerns. The patient  verbalized understanding and agreement with plan.       Waynetta Pean, PA-C 05/15/15 1108  Leonard Schwartz, MD 05/15/15 1110

## 2015-05-15 NOTE — ED Notes (Signed)
NAD at this time. Pt is stable and going home.  

## 2015-05-15 NOTE — ED Notes (Signed)
Pt came to the ED because she suspected she had a UTI, Pt was given antibiotics and said that it didn't help.  Pt stated that the antibiotics gave her a Yeast infection, and a recurrent UTI. Pt came back to the ED for further eval.

## 2015-05-17 LAB — URINE CULTURE: Culture: >=100000

## 2015-05-18 ENCOUNTER — Telehealth: Payer: Self-pay | Admitting: Emergency Medicine

## 2015-05-18 NOTE — Telephone Encounter (Signed)
Post ED Visit - Positive Culture Follow-up  Culture report reviewed by antimicrobial stewardship pharmacist: []  Wes [place], Pharm.D., BCPS [x]  Heide Guile, Pharm.D., BCPS []  Alycia Rossetti, Pharm.D., BCPS []  Fate, Pharm.D., BCPS, AAHIVP []  Legrand Como, Pharm.D., BCPS, AAHIVP []  Isac Sarna, Pharm.D., BCPS  Positive Urine culture Treated with Cephalexin, organism sensitive to the same and no further patient follow-up is required at this time.  Ernesta Amble 05/18/2015, 5:52 PM

## 2015-10-11 ENCOUNTER — Encounter (HOSPITAL_COMMUNITY): Payer: Self-pay | Admitting: *Deleted

## 2015-10-11 ENCOUNTER — Inpatient Hospital Stay (HOSPITAL_COMMUNITY)
Admission: AD | Admit: 2015-10-11 | Discharge: 2015-10-11 | Disposition: A | Discharge status: Home / Self Care | Payer: Medicaid Other | Source: Ambulatory Visit | Attending: Obstetrics & Gynecology | Admitting: Obstetrics & Gynecology

## 2015-10-11 ENCOUNTER — Inpatient Hospital Stay (HOSPITAL_COMMUNITY): Payer: Medicaid Other

## 2015-10-11 DIAGNOSIS — R1031 Right lower quadrant pain: Secondary | ICD-10-CM | POA: Diagnosis not present

## 2015-10-11 DIAGNOSIS — R102 Pelvic and perineal pain: Secondary | ICD-10-CM | POA: Diagnosis not present

## 2015-10-11 DIAGNOSIS — N730 Acute parametritis and pelvic cellulitis: Secondary | ICD-10-CM

## 2015-10-11 HISTORY — DX: Trichomoniasis, unspecified: A59.9

## 2015-10-11 HISTORY — DX: Benign neoplasm of connective and other soft tissue, unspecified: D21.9

## 2015-10-11 HISTORY — DX: Chlamydial infection, unspecified: A74.9

## 2015-10-11 HISTORY — DX: Obesity, unspecified: E66.9

## 2015-10-11 LAB — CBC WITH DIFFERENTIAL/PLATELET
Basophils Absolute: 0 10*3/uL (ref 0.0–0.1)
Basophils Relative: 0 %
EOS ABS: 0.1 10*3/uL (ref 0.0–0.7)
EOS PCT: 2 %
HCT: 40.2 % (ref 36.0–46.0)
Hemoglobin: 13 g/dL (ref 12.0–15.0)
LYMPHS ABS: 2.5 10*3/uL (ref 0.7–4.0)
LYMPHS PCT: 38 %
MCH: 22.9 pg — AB (ref 26.0–34.0)
MCHC: 32.3 g/dL (ref 30.0–36.0)
MCV: 70.8 fL — AB (ref 78.0–100.0)
MONO ABS: 0.6 10*3/uL (ref 0.1–1.0)
MONOS PCT: 9 %
Neutro Abs: 3.3 10*3/uL (ref 1.7–7.7)
Neutrophils Relative %: 51 %
PLATELETS: 268 10*3/uL (ref 150–400)
RBC: 5.68 MIL/uL — AB (ref 3.87–5.11)
RDW: 16.2 % — AB (ref 11.5–15.5)
WBC: 6.6 10*3/uL (ref 4.0–10.5)

## 2015-10-11 LAB — WET PREP, GENITAL
Clue Cells Wet Prep HPF POC: NONE SEEN
Sperm: NONE SEEN
TRICH WET PREP: NONE SEEN
YEAST WET PREP: NONE SEEN

## 2015-10-11 LAB — COMPREHENSIVE METABOLIC PANEL
ALT: 11 U/L — AB (ref 14–54)
AST: 15 U/L (ref 15–41)
Albumin: 3.7 g/dL (ref 3.5–5.0)
Alkaline Phosphatase: 74 U/L (ref 38–126)
Anion gap: 6 (ref 5–15)
BUN: 14 mg/dL (ref 6–20)
CO2: 29 mmol/L (ref 22–32)
CREATININE: 0.8 mg/dL (ref 0.44–1.00)
Calcium: 9.1 mg/dL (ref 8.9–10.3)
Chloride: 105 mmol/L (ref 101–111)
Glucose, Bld: 81 mg/dL (ref 65–99)
POTASSIUM: 3.6 mmol/L (ref 3.5–5.1)
SODIUM: 140 mmol/L (ref 135–145)
Total Bilirubin: 0.5 mg/dL (ref 0.3–1.2)
Total Protein: 7.4 g/dL (ref 6.5–8.1)

## 2015-10-11 LAB — URINALYSIS, ROUTINE W REFLEX MICROSCOPIC
Bilirubin Urine: NEGATIVE
Glucose, UA: NEGATIVE mg/dL
Hgb urine dipstick: NEGATIVE
KETONES UR: NEGATIVE mg/dL
LEUKOCYTES UA: NEGATIVE
NITRITE: NEGATIVE
PH: 7 (ref 5.0–8.0)
Protein, ur: NEGATIVE mg/dL
Specific Gravity, Urine: 1.02 (ref 1.005–1.030)

## 2015-10-11 LAB — POCT PREGNANCY, URINE: PREG TEST UR: NEGATIVE

## 2015-10-11 MED ORDER — IBUPROFEN 600 MG PO TABS: 600.0000 mg | ORAL_TABLET | Freq: Once | ORAL | Status: DC

## 2015-10-11 MED ORDER — DOXYCYCLINE HYCLATE 100 MG PO CAPS: 100.0000 mg | ORAL_CAPSULE | Freq: Two times a day (BID) | ORAL | Status: DC

## 2015-10-11 MED ORDER — CEFTRIAXONE SODIUM 250 MG IJ SOLR
250.0000 mg | Freq: Once | INTRAMUSCULAR | Status: AC
  Administered 2015-10-11: 250 mg via INTRAMUSCULAR
  Filled 2015-10-11: qty 250

## 2015-10-11 MED ORDER — AZITHROMYCIN 250 MG PO TABS
1000.0000 mg | ORAL_TABLET | Freq: Once | ORAL | Status: AC
  Administered 2015-10-11: 1000 mg via ORAL
  Filled 2015-10-11: qty 4

## 2015-10-11 MED ORDER — IBUPROFEN 600 MG PO TABS
600.0000 mg | ORAL_TABLET | Freq: Once | ORAL | Status: DC
  Filled 2015-10-11: qty 1

## 2015-10-11 NOTE — Progress Notes (Signed)
Patient states pain is minimal, declines ibuprofen

## 2015-10-11 NOTE — Discharge Instructions (Signed)

## 2015-10-11 NOTE — MAU Provider Note (Signed)
Chief Complaint:  Abdominal Pain  Sara Walsh is a 29 y.o. [id number] who presents to maternity admissions reporting Pain in right Side which radiates to back.  States she had it 2 weeks ago and it went away, only to return yesterday.  She tells me she has had this pain off and on for over 2 months.  Starts in RLQ and radiates to back.  Denies diarrhea or constipation. She reports no vaginal bleeding, vaginal itching/burning, urinary symptoms, h/a, dizziness, vomiting, or fever/chills.    Pelvic Pain The patient's primary symptoms include pelvic pain. The patient's pertinent negatives include no genital itching, genital lesions, vaginal bleeding or vaginal discharge. This is a recurrent problem. The current episode started more than 1 month ago. The problem occurs intermittently. The problem has been waxing and waning. The pain is mild. The problem affects the right side. She is not pregnant. Associated symptoms include abdominal pain, back pain and nausea (at times. in mornings). Pertinent negatives include no chills, constipation, diarrhea, dysuria, fever, frequency, headaches or vomiting. The symptoms are aggravated by tactile pressure. She has tried nothing for the symptoms. She is sexually active. It is unknown whether or not her partner has an STD. She uses nothing for contraception. Her menstrual history has been regular. Her past medical history is significant for miscarriage and vaginosis.  RN Note: Pain in R side - sometimes radiates into back. Had this pain 2 weeks ago, came back yesterday. Occasional nausea but no vomiting or diarrhea. Denies dysuria  Past Medical History: Past Medical History  Diagnosis Date  . Scabies     Past obstetric history: OB History  Gravida Para Term Preterm AB SAB TAB Ectopic Multiple Living  6 3 3  0 2 1 1  0 0 3    # Outcome Date GA Lbr Len/2nd Weight Sex Delivery Anes PTL Lv  6 TAB           5 SAB           4 Term           3 Term           2  Term           1 Gravida              Comments: System Generated. Please review and update pregnancy details.      Past Surgical History: Past Surgical History  Procedure Laterality Date  . Cesarean section      x3    Family History: Family History  Problem Relation Age of Onset  . Hypertension Father   . Anesthesia problems Neg Hx     Social History: Social History  Substance Use Topics  . Smoking status: Former Smoker    Types: Cigars, Cigarettes    Quit date: 09/09/2011  . Smokeless tobacco: Never Used  . Alcohol Use: No    Allergies: No Known Allergies  Meds:  Prescriptions prior to admission  Medication Sig Dispense Refill Last Dose  . cephALEXin (KEFLEX) 500 MG capsule Take 1 capsule (500 mg total) by mouth 4 (four) times daily. 40 capsule 0   . fluconazole (DIFLUCAN) 150 MG tablet Take 1 tablet (150 mg total) by mouth once. 1 tablet 0   . phenazopyridine (PYRIDIUM) 200 MG tablet Take 200 mg by mouth 3 (three) times daily.  0 Completed Course at Unknown time   ROS:  Review of Systems  Constitutional: Negative for fever and chills.  Respiratory: Negative for shortness  of breath.   Gastrointestinal: Positive for nausea (at times. in mornings) and abdominal pain. Negative for vomiting, diarrhea and constipation.  Genitourinary: Positive for pelvic pain. Negative for dysuria, frequency, vaginal bleeding, vaginal discharge, vaginal pain, menstrual problem and dyspareunia (states no intercourse in 2 months).  Musculoskeletal: Positive for back pain. Negative for neck pain.  Neurological: Negative for dizziness and headaches.   I have reviewed patient's Past Medical Hx, Surgical Hx, Family Hx, Social Hx, medications and allergies.   Physical Exam  Patient Vitals for the past 24 hrs:  BP Temp Temp src Pulse Resp  10/11/15 1216 114/83 mmHg 98.3 F (36.8 C) Oral 68 18   Constitutional: Well-developed, well-nourished female in no acute distress.  Cardiovascular:  normal rate and rhythm, no ectopy audible, S1 & S2 heard, no murmur Respiratory: normal effort, no distress. Lungs CTAB with no wheezes or crackles GI: Abd soft, non-tender, except for RLQ.  Nondistended.  No rebound, No guarding.  Bowel Sounds audible  MS: Extremities nontender, no edema, normal ROM Neurologic: Alert and oriented x 4.   Grossly nonfocal. GU: Neg CVAT. Skin:  Warm and Dry Psych:  Affect appropriate.  PELVIC EXAM: Cervix pink, visually closed, without lesion, scant white creamy discharge, vaginal walls and external genitalia normal Bimanual exam: Cervix firm, anterior, + CMT, uterus  Moderately tender, nonenlarged, adnexa tender bilaterally but right adnexa is significantly tender.  Exam limited by habitus    Labs:    Results for orders placed or performed during the hospital encounter of 10/11/15 (from the past 24 hour(s))  Urinalysis, Routine w reflex microscopic (not at [facility])     Status: Abnormal   Collection Time: 10/11/15 12:20 PM  Result Value Ref Range   Color, Urine YELLOW YELLOW   APPearance HAZY (A) CLEAR   Specific Gravity, Urine 1.020 1.005 - 1.030   pH 7.0 5.0 - 8.0   Glucose, UA NEGATIVE NEGATIVE mg/dL   Hgb urine dipstick NEGATIVE NEGATIVE   Bilirubin Urine NEGATIVE NEGATIVE   Ketones, ur NEGATIVE NEGATIVE mg/dL   Protein, ur NEGATIVE NEGATIVE mg/dL   Nitrite NEGATIVE NEGATIVE   Leukocytes, UA NEGATIVE NEGATIVE  Pregnancy, urine POC     Status: None   Collection Time: 10/11/15 12:39 PM  Result Value Ref Range   Preg Test, Ur NEGATIVE NEGATIVE  CBC with Differential/Platelet     Status: Abnormal   Collection Time: 10/11/15  1:37 PM  Result Value Ref Range   WBC 6.6 4.0 - 10.5 K/uL   RBC 5.68 (H) 3.87 - 5.11 MIL/uL   Hemoglobin 13.0 12.0 - 15.0 g/dL   HCT 40.2 36.0 - 46.0 %   MCV 70.8 (L) 78.0 - 100.0 fL   MCH 22.9 (L) 26.0 - 34.0 pg   MCHC 32.3 30.0 - 36.0 g/dL   RDW 16.2 (H) 11.5 - 15.5 %   Platelets 268 150 - 400 K/uL   Neutrophils  Relative % 51 %   Neutro Abs 3.3 1.7 - 7.7 K/uL   Lymphocytes Relative 38 %   Lymphs Abs 2.5 0.7 - 4.0 K/uL   Monocytes Relative 9 %   Monocytes Absolute 0.6 0.1 - 1.0 K/uL   Eosinophils Relative 2 %   Eosinophils Absolute 0.1 0.0 - 0.7 K/uL   Basophils Relative 0 %   Basophils Absolute 0.0 0.0 - 0.1 K/uL  Comprehensive metabolic panel     Status: Abnormal   Collection Time: 10/11/15  1:37 PM  Result Value Ref Range   Sodium 140  135 - 145 mmol/L   Potassium 3.6 3.5 - 5.1 mmol/L   Chloride 105 101 - 111 mmol/L   CO2 29 22 - 32 mmol/L   Glucose, Bld 81 65 - 99 mg/dL   BUN 14 6 - 20 mg/dL   Creatinine, Ser 0.80 0.44 - 1.00 mg/dL   Calcium 9.1 8.9 - 10.3 mg/dL   Total Protein 7.4 6.5 - 8.1 g/dL   Albumin 3.7 3.5 - 5.0 g/dL   AST 15 15 - 41 U/L   ALT 11 (L) 14 - 54 U/L   Alkaline Phosphatase 74 38 - 126 U/L   Total Bilirubin 0.5 0.3 - 1.2 mg/dL   GFR calc non Af Amer >60 >60 mL/min   GFR calc Af Amer >60 >60 mL/min   Anion gap 6 5 - 15     Imaging:  US Transvaginal Non-ob  10/11/2015  CLINICAL DATA:  Intermittent pelvic pain for 2 months. EXAM: TRANSABDOMINAL AND TRANSVAGINAL ULTRASOUND OF PELVIS TECHNIQUE: Both transabdominal and transvaginal ultrasound examinations of the pelvis were performed. Transabdominal technique was performed for global imaging of the pelvis including uterus, ovaries, adnexal regions, and pelvic cul-de-sac. It was necessary to proceed with endovaginal exam following the transabdominal exam to visualize the ovaries and endometrium. COMPARISON:  None FINDINGS: Uterus Measurements: 10.5 x 4.9 x 5.9 cm. Single 2.3 cm fibroid noted in the right myometrium. Endometrium Thickness: 10.6 mm.  No focal abnormality visualized. Right ovary Measurements: 4.6 x 2.3 x 2.6 cm. Normal appearance/no adnexal mass. Left ovary Measurements: 4.9 x 2.5 x 2.3 cm. Normal appearance/no adnexal mass. Other findings No free fluid. IMPRESSION: 1. Single 2.3 cm right-sided myometrial  fibroid. 2. Normal endometrium. 3. Normal ovaries. 4. No free pelvic fluid collections. Electronically Signed   By: Marijo Sanes M.D.   On: 10/11/2015 16:51   US Pelvis Complete  10/11/2015  CLINICAL DATA:  Intermittent pelvic pain for 2 months. EXAM: TRANSABDOMINAL AND TRANSVAGINAL ULTRASOUND OF PELVIS TECHNIQUE: Both transabdominal and transvaginal ultrasound examinations of the pelvis were performed. Transabdominal technique was performed for global imaging of the pelvis including uterus, ovaries, adnexal regions, and pelvic cul-de-sac. It was necessary to proceed with endovaginal exam following the transabdominal exam to visualize the ovaries and endometrium. COMPARISON:  None FINDINGS: Uterus Measurements: 10.5 x 4.9 x 5.9 cm. Single 2.3 cm fibroid noted in the right myometrium. Endometrium Thickness: 10.6 mm.  No focal abnormality visualized. Right ovary Measurements: 4.6 x 2.3 x 2.6 cm. Normal appearance/no adnexal mass. Left ovary Measurements: 4.9 x 2.5 x 2.3 cm. Normal appearance/no adnexal mass. Other findings No free fluid. IMPRESSION: 1. Single 2.3 cm right-sided myometrial fibroid. 2. Normal endometrium. 3. Normal ovaries. 4. No free pelvic fluid collections. Electronically Signed   By: Marijo Sanes M.D.   On: 10/11/2015 16:51    MAU Course/MDM: I have ordered labs as follows:  Labs as above  Imaging ordered: pelvic ultrasound Results reviewed. All negative.  Will treat as PID.  Treatments in MAU included IM Rocephin and PO Zithromax.    Afebrile, NAD, non-toxic appearing, AAOx4. Patient is nontoxic, nonseptic appearing, in no apparent distress. Patient's pain and other symptoms adequately managed in emergency department.PO Fluids tolerated in emergency department. Labs, and vitals reviewed. Patient does not meet the SIRS or Sepsis criteria. On repeat exam patient does not have a surgical abdomen and there are nor peritoneal signs. No indication of appendicitis, bowel obstruction,  bowel perforation, cholecystitis, diverticulitis, or ectopic pregnancy. GC and Chlamydia pending, patient is  informed that she will be notified in 48-72 hours of positive results and will require return here or to her doctor for treatment. Patient discharged home with symptomatic treatment and given strict instructions for follow-up with their primary care physician. I have also discussed reasons to return immediately to the ER. Patient expresses understanding and agrees with plan.  Pt stable at time of discharge.  Assessment: Right lower quadrant pain  Probable Pelvic Inflammatory Disease  Plan: Discharge home Recommend safe sex practices Rx sent for Doxycycline for completion of PID treatment x 14 days     Medication List    ASK your doctor about these medications        cephALEXin 500 MG capsule  Commonly known as:  KEFLEX  Take 1 capsule (500 mg total) by mouth 4 (four) times daily.     fluconazole 150 MG tablet  Commonly known as:  DIFLUCAN  Take 1 tablet (150 mg total) by mouth once.     phenazopyridine 200 MG tablet  Commonly known as:  PYRIDIUM  Take 200 mg by mouth 3 (three) times daily.       Encouraged to return here or to other Urgent Care/ED if she develops worsening of symptoms, increase in pain, fever, or other concerning symptoms.   Hansel Feinstein CNM, MSN Certified Nurse-Midwife 10/11/2015 1:31 PM

## 2015-10-11 NOTE — MAU Note (Signed)
Pain in R side - sometimes radiates into back.  Had this pain 2 weeks ago, came back yesterday.  Occasional nausea but no vomiting or diarrhea.  Denies dysuria.

## 2015-10-12 LAB — GC/CHLAMYDIA PROBE AMP (~~LOC~~) NOT AT ARMC
Chlamydia: NEGATIVE
NEISSERIA GONORRHEA: NEGATIVE

## 2016-10-06 ENCOUNTER — Ambulatory Visit (HOSPITAL_COMMUNITY)
Admission: EM | Admit: 2016-10-06 | Discharge: 2016-10-06 | Disposition: A | Discharge status: Home / Self Care | Payer: Medicaid Other | Attending: Emergency Medicine | Admitting: Emergency Medicine

## 2016-10-06 ENCOUNTER — Encounter (HOSPITAL_COMMUNITY): Payer: Self-pay | Admitting: Emergency Medicine

## 2016-10-06 DIAGNOSIS — I10 Essential (primary) hypertension: Secondary | ICD-10-CM

## 2016-10-06 MED ORDER — AMLODIPINE BESYLATE 5 MG PO TABS: 5.0000 mg | ORAL_TABLET | Freq: Every day | ORAL | 0 refills | Status: DC

## 2016-10-06 NOTE — ED Provider Notes (Signed)
CSN: [id number]     Arrival date & time 10/06/16  1449 History   First MD Initiated Contact with Patient 10/06/16 1510     Chief Complaint  Patient presents with  . Hypertension   (Consider location/radiation/quality/duration/timing/severity/associated sxs/prior Treatment) Patient states she has been having elevated BP readings and she is worried about having HTN since it runs in the family and she is having occasional headache. She does not have headache at present.   The history is provided by the patient.  Hypertension  This is a new problem. The current episode started more than 1 week ago. The problem occurs constantly. The problem has not changed since onset.Nothing aggravates the symptoms. Nothing relieves the symptoms. She has tried nothing for the symptoms.    Past Medical History:  Diagnosis Date  . Chlamydia   . Fibroid   . Obesity   . Scabies   . Trichomonas infection    Past Surgical History:  Procedure Laterality Date  . CESAREAN SECTION     x3   Family History  Problem Relation Age of Onset  . Hypertension Father   . Anesthesia problems Neg Hx    Social History  Substance Use Topics  . Smoking status: Former Smoker    Types: Cigars, Cigarettes    Quit date: 09/09/2011  . Smokeless tobacco: Never Used  . Alcohol use No   OB History    Gravida Para Term Preterm AB Living   6 3 3  0 2 3   SAB TAB Ectopic Multiple Live Births   1 1 0 0       Review of Systems  Constitutional: Negative.   HENT: Negative.   Eyes: Negative.   Respiratory: Negative.   Cardiovascular: Negative.   Gastrointestinal: Negative.   Endocrine: Negative.   Genitourinary: Negative.   Musculoskeletal: Negative.   Skin: Negative.   Allergic/Immunologic: Negative.   Neurological: Negative.   Hematological: Negative.   Psychiatric/Behavioral: Negative.     Allergies  Patient has no known allergies.  Home Medications   Prior to Admission medications   Medication Sig  Start Date End Date Taking? Authorizing Provider  amLODipine (NORVASC) 5 MG tablet Take 1 tablet (5 mg total) by mouth daily. 10/06/16   Lysbeth Penner, FNP  doxycycline (VIBRAMYCIN) 100 MG capsule Take 1 capsule (100 mg total) by mouth 2 (two) times daily. 10/11/15   Seabron Spates, CNM  ibuprofen (ADVIL,MOTRIN) 600 MG tablet Take 1 tablet (600 mg total) by mouth once. 10/11/15   Seabron Spates, CNM   Meds Ordered and Administered this Visit  Medications - No data to display  BP 153/92 (BP Location: Left Arm)   Pulse 107   Temp 99.3 F (37.4 C) (Oral)   Resp 16   Ht 5\' 4"  (1.626 m)   Wt 189 lb (85.7 kg)   LMP 09/17/2016   SpO2 100%   BMI 32.44 kg/m  No data found.   Physical Exam  Constitutional: She is oriented to person, place, and time. She appears well-developed and well-nourished.  HENT:  Head: Normocephalic and atraumatic.  Eyes: EOM are normal. Pupils are equal, round, and reactive to light.  Neck: Normal range of motion. Neck supple.  Cardiovascular: Normal rate, regular rhythm and normal heart sounds.   Pulmonary/Chest: Effort normal and breath sounds normal.  Abdominal: Soft. Bowel sounds are normal.  Neurological: She is alert and oriented to person, place, and time.  Nursing note and vitals reviewed.   Urgent Care  Course   Clinical Course     Procedures (including critical care time)  Labs Review Labs Reviewed - No data to display  Imaging Review No results found.   Visual Acuity Review  Right Eye Distance:   Left Eye Distance:   Bilateral Distance:    Right Eye Near:   Left Eye Near:    Bilateral Near:         MDM   1. Essential hypertension    Amlodipine 5mg  one po qd #30 BP rechecked and was 140/94 left arm. Explained to patient she needs to go to PCP for follow up and management of hypertension.    Lysbeth Penner, FNP 10/06/16 1556

## 2016-10-06 NOTE — ED Triage Notes (Signed)
PT reports she had a physical Thursday and was hypertensive. PT is following up with that office Wednesday. PT reports slight headache all weekend and intermittent chest pain. PT denies chest pain at this time.

## 2017-01-19 ENCOUNTER — Encounter (HOSPITAL_COMMUNITY): Payer: Self-pay | Admitting: Emergency Medicine

## 2017-01-19 ENCOUNTER — Emergency Department (HOSPITAL_COMMUNITY)
Admission: EM | Admit: 2017-01-19 | Discharge: 2017-01-19 | Disposition: A | Discharge status: Home / Self Care | Payer: Medicaid Other | Attending: Emergency Medicine | Admitting: Emergency Medicine

## 2017-01-19 DIAGNOSIS — R1030 Lower abdominal pain, unspecified: Secondary | ICD-10-CM

## 2017-01-19 DIAGNOSIS — Z87891 Personal history of nicotine dependence: Secondary | ICD-10-CM | POA: Insufficient documentation

## 2017-01-19 DIAGNOSIS — R109 Unspecified abdominal pain: Secondary | ICD-10-CM | POA: Diagnosis present

## 2017-01-19 DIAGNOSIS — A599 Trichomoniasis, unspecified: Secondary | ICD-10-CM

## 2017-01-19 LAB — URINALYSIS, ROUTINE W REFLEX MICROSCOPIC
BILIRUBIN URINE: NEGATIVE
Glucose, UA: NEGATIVE mg/dL
HGB URINE DIPSTICK: NEGATIVE
Ketones, ur: NEGATIVE mg/dL
Leukocytes, UA: NEGATIVE
NITRITE: NEGATIVE
PROTEIN: NEGATIVE mg/dL
SPECIFIC GRAVITY, URINE: 1.014 (ref 1.005–1.030)
pH: 7 (ref 5.0–8.0)

## 2017-01-19 LAB — WET PREP, GENITAL
Sperm: NONE SEEN
YEAST WET PREP: NONE SEEN

## 2017-01-19 LAB — POC URINE PREG, ED: Preg Test, Ur: NEGATIVE

## 2017-01-19 MED ORDER — AZITHROMYCIN 250 MG PO TABS
1000.0000 mg | ORAL_TABLET | Freq: Once | ORAL | Status: AC
  Administered 2017-01-19: 1000 mg via ORAL
  Filled 2017-01-19: qty 4

## 2017-01-19 MED ORDER — CEFTRIAXONE SODIUM 250 MG IJ SOLR
250.0000 mg | Freq: Once | INTRAMUSCULAR | Status: AC
  Administered 2017-01-19: 250 mg via INTRAMUSCULAR
  Filled 2017-01-19: qty 250

## 2017-01-19 MED ORDER — METRONIDAZOLE 500 MG PO TABS
2000.0000 mg | ORAL_TABLET | Freq: Once | ORAL | Status: AC
  Administered 2017-01-19: 2000 mg via ORAL
  Filled 2017-01-19: qty 4

## 2017-01-19 MED ORDER — LIDOCAINE HCL (PF) 1 % IJ SOLN
INTRAMUSCULAR | Status: AC
  Administered 2017-01-19: 5 mL
  Filled 2017-01-19: qty 5

## 2017-01-19 NOTE — ED Triage Notes (Signed)
Pt c/o right sided abdominal pain ongoing for a while. Pt reports that she was seen by her Dr and had a pap smear done. Per pt doctor told her that she was dry and that her pap smear is abnormal but did not specify exactly what was wrong. Pt here seeking second opinion.

## 2017-01-19 NOTE — Discharge Instructions (Signed)

## 2017-01-19 NOTE — ED Provider Notes (Signed)
Emergency Department Provider Note   I have reviewed the triage vital signs and the nursing notes.   HISTORY  Chief Complaint Abdominal Pain   HPI Sara Walsh is a 31 y.o. female with PMH of chlamydia, fibroids, and trichomonas presents to the emergency department for evaluation of abnormal Pap smear results. The patient states that for the past 1-2 years she's had intermittent right-sided abdominal pain. It is mild, nonradiating, improved with rubbing her abdomen. She went to her primary care physician last week who did an exam along with a Pap smear. She was told on Friday that she had an abnormal Pap smear and that this could indicate that she had cervical cancer. He states the primary care discussed referring her to an OB/GYN but had not done it yet because of the weekend. The patient is been having increasing concern over the weekend about her results and came in today for "a second opinion." She describes some right lower quadrant pain currently that feels typical of her prior pain episodes. No fevers or chills. No vaginal bleeding. She does have some mild vaginal discharge.   Past Medical History:  Diagnosis Date  . Chlamydia   . Fibroid   . Obesity   . Scabies   . Trichomonas infection     There are no active problems to display for this patient.   Past Surgical History:  Procedure Laterality Date  . CESAREAN SECTION     x3    Current Outpatient Rx  . Order #: [id number] Class: Print  . Order #: [id number] Class: Normal  . Order #: [id number] Class: Normal    Allergies Patient has no known allergies.  Family History  Problem Relation Age of Onset  . Hypertension Father   . Anesthesia problems Neg Hx     Social History Social History  Substance Use Topics  . Smoking status: Former Smoker    Types: Cigars, Cigarettes    Quit date: 09/09/2011  . Smokeless tobacco: Never Used  . Alcohol use No    Review of Systems  10-point ROS otherwise  negative.  ____________________________________________   PHYSICAL EXAM:  VITAL SIGNS: ED Triage Vitals  Enc Vitals Group     BP 01/19/17 1106 134/83     Pulse Rate 01/19/17 1106 78     Resp 01/19/17 1106 20     Temp 01/19/17 1106 98.8 F (37.1 C)     Temp Source 01/19/17 1106 Oral     SpO2 01/19/17 1106 100 %     Weight 01/19/17 1111 243 lb (110.2 kg)     Height 01/19/17 1111 5\' 4"  (1.626 m)     Pain Score 01/19/17 1112 7   Constitutional: Alert and oriented. Well appearing and in no acute distress. Eyes: Conjunctivae are normal.  Head: Atraumatic. Nose: No congestion/rhinnorhea. Mouth/Throat: Mucous membranes are moist.   Neck: No stridor.  Cardiovascular: Normal rate, regular rhythm. Good peripheral circulation. Grossly normal heart sounds.   Respiratory: Normal respiratory effort.  No retractions. Lungs CTAB. Gastrointestinal: Soft and nontender. No distention.  Genitourinary: Mild vaginal discharge. Visually normal cervix. No CMT. No bleeding. No Adnexal tenderness.  Musculoskeletal: No lower extremity tenderness nor edema. No gross deformities of extremities. Neurologic:  Normal speech and language. No gross focal neurologic deficits are appreciated.  Skin:  Skin is warm, dry and intact. No rash noted. Psychiatric: Mood and affect are normal. Speech and behavior are normal.  ____________________________________________   LABS (all labs ordered are listed, but only abnormal results  are displayed)  Labs Reviewed  WET PREP, GENITAL - Abnormal; Notable for the following:       Result Value   Trich, Wet Prep PRESENT (*)    Clue Cells Wet Prep HPF POC PRESENT (*)    WBC, Wet Prep HPF POC MODERATE (*)    All other components within normal limits  URINALYSIS, ROUTINE W REFLEX MICROSCOPIC - Abnormal; Notable for the following:    Color, Urine STRAW (*)    All other components within normal limits  POC URINE PREG, ED  GC/CHLAMYDIA PROBE AMP ([place]) NOT AT [facility]    ____________________________________________  RADIOLOGY  None ____________________________________________   PROCEDURES  Procedure(s) performed:   Procedures  None ____________________________________________   INITIAL IMPRESSION / ASSESSMENT AND PLAN / ED COURSE  Pertinent labs & imaging results that were available during my care of the patient were reviewed by me and considered in my medical decision making (see chart for details).  Patient presents to the emergency department for evaluation of chronic right lower quadrant abdominal discomfort in the setting of recently being told about an abnormal Pap smear. This information is not available to me. I'm unable to discuss any results with her as to how atypical the Pap smear was. She is currently having some right lower quadrant discomfort. Plan for pelvic exam, wet prep, STD screening, and urinalysis. Plan to refer to OB/GYN for further workup of possible abnormal Pap smear.  Patient with Trich on wet prep. Treated for this and gonorrhea/chlamydia. WIll follow with OB/Gyn for abnormal pap result reported by PCP. Discussed need to treat partner(s) for STDs as well.   At this time, I do not feel there is any life-threatening condition present. I have reviewed and discussed all results (EKG, imaging, lab, urine as appropriate), exam findings with patient. I have reviewed nursing notes and appropriate previous records.  I feel the patient is safe to be discharged home without further emergent workup. Discussed usual and customary return precautions. Patient and family (if present) verbalize understanding and are comfortable with this plan.  Patient will follow-up with their primary care provider. If they do not have a primary care provider, information for follow-up has been provided to them. All questions have been answered.  ____________________________________________  FINAL CLINICAL IMPRESSION(S) / ED DIAGNOSES  Final  diagnoses:  Lower abdominal pain  Trichomonas infection     MEDICATIONS GIVEN DURING THIS VISIT:  Medications  metroNIDAZOLE (FLAGYL) tablet 2,000 mg (2,000 mg Oral Given 01/19/17 1341)  azithromycin (ZITHROMAX) tablet 1,000 mg (1,000 mg Oral Given 01/19/17 1341)  cefTRIAXone (ROCEPHIN) injection 250 mg (250 mg Intramuscular Given 01/19/17 1341)  lidocaine (PF) (XYLOCAINE) 1 % injection (5 mLs  Given 01/19/17 1341)     NEW OUTPATIENT MEDICATIONS STARTED DURING THIS VISIT:  None   Note:  This document was prepared using Dragon voice recognition software and may include unintentional dictation errors.  Nanda Quinton, MD Emergency Medicine   Margette Fast, MD 01/19/17 1910

## 2017-01-20 LAB — GC/CHLAMYDIA PROBE AMP (~~LOC~~) NOT AT ARMC
Chlamydia: NEGATIVE
Neisseria Gonorrhea: NEGATIVE

## 2017-06-16 ENCOUNTER — Ambulatory Visit (HOSPITAL_COMMUNITY)
Admission: EM | Admit: 2017-06-16 | Discharge: 2017-06-16 | Disposition: A | Discharge status: Home / Self Care | Payer: Medicaid Other | Attending: Family Medicine | Admitting: Family Medicine

## 2017-06-16 ENCOUNTER — Encounter (HOSPITAL_COMMUNITY): Payer: Self-pay | Admitting: *Deleted

## 2017-06-16 DIAGNOSIS — B027 Disseminated zoster: Secondary | ICD-10-CM | POA: Diagnosis not present

## 2017-06-16 DIAGNOSIS — S0032XA Blister (nonthermal) of nose, initial encounter: Secondary | ICD-10-CM

## 2017-06-16 DIAGNOSIS — R030 Elevated blood-pressure reading, without diagnosis of hypertension: Secondary | ICD-10-CM | POA: Diagnosis not present

## 2017-06-16 MED ORDER — ACYCLOVIR 800 MG PO TABS: 800.0000 mg | ORAL_TABLET | Freq: Every day | ORAL | 0 refills | Status: DC

## 2017-06-16 NOTE — ED Triage Notes (Signed)
Patient reports intermittent episodes of small blisters on her face x 1 month. States they are not like pimples are very sore. Mostly in nasal region.

## 2017-06-16 NOTE — Discharge Instructions (Signed)
You are being treated for a viral skin infection called disseminated zoster. Do no scratch or pick at your blisters as this can spread the infection. I have prescribed acyclovir, one tablet 5 times a day for one week. If symptoms persist past 2 weeks. Follow up with your primary care provider or return to clinic.

## 2017-06-16 NOTE — ED Provider Notes (Signed)
  [place]   [id number] 06/16/17 Arrival Time: 1221   SUBJECTIVE:  Sara Walsh is a 31 y.o. female who presents to the urgent care with complaint of painful, blisters on her nose that is been present on her nose for one month. They've been waxing and waning. She has had them on both sides of her nose. She describes the pain as burning, tingling and throbbing.  ROS: As per HPI, remainder of ROS negative.   OBJECTIVE:  Vitals:   06/16/17 1333  BP: (!) 140/100  Pulse: 92  Resp: 16  Temp: 98 F (36.7 C)  TempSrc: Oral  SpO2: 100%     General appearance: alert; no distress HEENT: normocephalic; atraumatic; conjunctivae normal; Erythemic with vesicles on the nose Neck: Trachea midline, noted and no Lungs: clear to auscultation bilaterally Heart: regular rate and rhythm Abdomen: soft, non-tender; bowel sounds normal; no masses or organomegaly; no guarding or rebound tenderness Skin: warm and dry Neurologic: Grossly normal Psychological:  alert and cooperative; normal mood and affect     ASSESSMENT & PLAN:  1. Disseminated herpes zoster     Meds ordered this encounter  Medications  . acyclovir (ZOVIRAX) 800 MG tablet    Sig: Take 1 tablet (800 mg total) by mouth 5 (five) times daily.    Dispense:  35 tablet    Refill:  0    Order Specific Question:   Supervising Provider    Answer:   Robyn Haber [[id number]]    Reviewed expectations re: course of current medical issues. Questions answered. Outlined signs and symptoms indicating need for more acute intervention. Patient verbalized understanding. After Visit Summary given.    Procedures:    Labs Reviewed - No data to display  No results found.  No Known Allergies  PMHx, SurgHx, SocialHx, Medications, and Allergies were reviewed in the Visit Navigator and updated as appropriate.       Barnet Glasgow, NP 06/16/17 [phone]

## 2017-11-20 ENCOUNTER — Encounter (HOSPITAL_COMMUNITY): Payer: Self-pay | Admitting: Emergency Medicine

## 2017-11-20 ENCOUNTER — Other Ambulatory Visit: Payer: Self-pay

## 2017-11-20 DIAGNOSIS — N760 Acute vaginitis: Secondary | ICD-10-CM | POA: Insufficient documentation

## 2017-11-20 DIAGNOSIS — Z79899 Other long term (current) drug therapy: Secondary | ICD-10-CM | POA: Insufficient documentation

## 2017-11-20 DIAGNOSIS — Z87891 Personal history of nicotine dependence: Secondary | ICD-10-CM | POA: Diagnosis not present

## 2017-11-20 DIAGNOSIS — N898 Other specified noninflammatory disorders of vagina: Secondary | ICD-10-CM | POA: Diagnosis present

## 2017-11-20 DIAGNOSIS — A599 Trichomoniasis, unspecified: Secondary | ICD-10-CM | POA: Diagnosis not present

## 2017-11-20 LAB — POC URINE PREG, ED: PREG TEST UR: NEGATIVE

## 2017-11-20 NOTE — ED Triage Notes (Signed)
Pt reports vaginal itching for a couple of days, which continues to get worse.  Pt has not tried any OTC for relief.

## 2017-11-21 ENCOUNTER — Emergency Department (HOSPITAL_COMMUNITY)
Admission: EM | Admit: 2017-11-21 | Discharge: 2017-11-21 | Disposition: A | Discharge status: Home / Self Care | Payer: Medicaid Other | Attending: Emergency Medicine | Admitting: Emergency Medicine

## 2017-11-21 DIAGNOSIS — B9689 Other specified bacterial agents as the cause of diseases classified elsewhere: Secondary | ICD-10-CM

## 2017-11-21 DIAGNOSIS — A5901 Trichomonal vulvovaginitis: Secondary | ICD-10-CM

## 2017-11-21 DIAGNOSIS — N760 Acute vaginitis: Secondary | ICD-10-CM

## 2017-11-21 LAB — URINALYSIS, ROUTINE W REFLEX MICROSCOPIC
Bilirubin Urine: NEGATIVE
Glucose, UA: NEGATIVE mg/dL
Hgb urine dipstick: NEGATIVE
KETONES UR: NEGATIVE mg/dL
Nitrite: NEGATIVE
PROTEIN: NEGATIVE mg/dL
Specific Gravity, Urine: 1.029 (ref 1.005–1.030)
pH: 5 (ref 5.0–8.0)

## 2017-11-21 LAB — WET PREP, GENITAL
Sperm: NONE SEEN
Yeast Wet Prep HPF POC: NONE SEEN

## 2017-11-21 LAB — GC/CHLAMYDIA PROBE AMP (~~LOC~~) NOT AT ARMC
Chlamydia: NEGATIVE
NEISSERIA GONORRHEA: NEGATIVE

## 2017-11-21 LAB — RPR: RPR Ser Ql: NONREACTIVE

## 2017-11-21 LAB — HIV ANTIBODY (ROUTINE TESTING W REFLEX): HIV Screen 4th Generation wRfx: NONREACTIVE

## 2017-11-21 MED ORDER — METRONIDAZOLE 500 MG PO TABS
2000.0000 mg | ORAL_TABLET | Freq: Once | ORAL | Status: AC
Start: 2017-11-21 — End: 2017-11-21
  Administered 2017-11-21: 2000 mg via ORAL
  Filled 2017-11-21: qty 4

## 2017-11-21 MED ORDER — METRONIDAZOLE 500 MG PO TABS: 500.0000 mg | ORAL_TABLET | Freq: Two times a day (BID) | ORAL | 0 refills | Status: DC

## 2017-11-21 MED ORDER — AZITHROMYCIN 250 MG PO TABS
1000.0000 mg | ORAL_TABLET | Freq: Once | ORAL | Status: AC
  Administered 2017-11-21: 1000 mg via ORAL
  Filled 2017-11-21: qty 4

## 2017-11-21 MED ORDER — CEFTRIAXONE SODIUM 250 MG IJ SOLR
250.0000 mg | Freq: Once | INTRAMUSCULAR | Status: AC
  Administered 2017-11-21: 250 mg via INTRAMUSCULAR
  Filled 2017-11-21: qty 250

## 2017-11-21 MED ORDER — STERILE WATER FOR INJECTION IJ SOLN
INTRAMUSCULAR | Status: AC
  Filled 2017-11-21: qty 10

## 2017-11-21 NOTE — Discharge Instructions (Signed)
You have been treated for trichomonas, chlamydia and gonorrhea today.  All recent sexual partners need to be informed and tested and treated as well.  Please avoid any intercourse including oral, vaginal, anal intercourse for the next week and after all partners have been treated as well and have waited a week themselves.  We have also tested you for HIV and syphilis.  If any of these tests are positive, you will be contacted.    [email or web address] [place] [address] [place], [place] [phone]    [email or web address] [address] [place], [place] ([place]) [place] # [place], [place] [phone]   [email or web address] [address] [place], [place] [phone]   [facility] [email or web address] [address] [place], [place] [phone]   [facility] [email or web address], [email or web address] [phone]

## 2017-11-21 NOTE — ED Provider Notes (Signed)
TIME SEEN: 2:01 AM  CHIEF COMPLAINT: Vaginal itching and burning  HPI: Patient is a 32 year old female with history of chlamydia, trichomonas in the past who presents to the emergency department 2 days of vaginal itching, burning, odor and white discharge.  She has had some pressure in the lower aspect of her abdomen but none currently.  No fevers, nausea, vomiting, diarrhea.  No dysuria, hematuria, urinary frequency or urgency.  She is sexually active.  Last menstrual period was December 25.  Patient is a [id number].  ROS: See HPI Constitutional: no fever  Eyes: no drainage  ENT: no runny nose   Cardiovascular:  no chest pain  Resp: no SOB  GI: no vomiting GU: no dysuria Integumentary: no rash  Allergy: no hives  Musculoskeletal: no leg swelling  Neurological: no slurred speech ROS otherwise negative  PAST MEDICAL HISTORY/PAST SURGICAL HISTORY:  Past Medical History:  Diagnosis Date  . Chlamydia   . Fibroid   . Obesity   . Scabies   . Trichomonas infection     MEDICATIONS:  Prior to Admission medications   Medication Sig Start Date End Date Taking? Authorizing Provider  acyclovir (ZOVIRAX) 800 MG tablet Take 1 tablet (800 mg total) by mouth 5 (five) times daily. 06/16/17   Barnet Glasgow, NP  amLODipine (NORVASC) 5 MG tablet Take 1 tablet (5 mg total) by mouth daily. 10/06/16   Lysbeth Penner, FNP  doxycycline (VIBRAMYCIN) 100 MG capsule Take 1 capsule (100 mg total) by mouth 2 (two) times daily. 10/11/15   Seabron Spates, CNM  ibuprofen (ADVIL,MOTRIN) 600 MG tablet Take 1 tablet (600 mg total) by mouth once. 10/11/15   Seabron Spates, CNM    ALLERGIES:  No Known Allergies  SOCIAL HISTORY:  Social History   Tobacco Use  . Smoking status: Former Smoker    Types: Cigars, Cigarettes    Last attempt to quit: 09/09/2011    Years since quitting: 6.2  . Smokeless tobacco: Never Used  Substance Use Topics  . Alcohol use: No    FAMILY HISTORY: Family History   Problem Relation Age of Onset  . Hypertension Father   . Anesthesia problems Neg Hx     EXAM: BP 136/84   Pulse 74   Temp 98.2 F (36.8 C) (Oral)   Resp 16   Ht 5\' 4"  (1.626 m)   Wt 99.8 kg (220 lb)   SpO2 100%   BMI 37.76 kg/m  CONSTITUTIONAL: Alert and oriented and responds appropriately to questions. Well-appearing; well-nourished HEAD: Normocephalic EYES: Conjunctivae clear, pupils appear equal, EOMI ENT: normal nose; moist mucous membranes NECK: Supple, no meningismus, no nuchal rigidity, no LAD  CARD: RRR; S1 and S2 appreciated; no murmurs, no clicks, no rubs, no gallops RESP: Normal chest excursion without splinting or tachypnea; breath sounds clear and equal bilaterally; no wheezes, no rhonchi, no rales, no hypoxia or respiratory distress, speaking full sentences ABD/GI: Normal bowel sounds; non-distended; soft, non-tender, no rebound, no guarding, no peritoneal signs, no hepatosplenomegaly GU:  Normal external genitalia. No lesions, rashes noted. Patient has no vaginal bleeding on exam.  Moderate amount of white foul-smelling thin vaginal discharge.  No adnexal tenderness, mass or fullness, no cervical motion tenderness. Cervix is not appear friable.  Cervix is closed.  Chaperone present for exam. BACK:  The back appears normal and is non-tender to palpation, there is no CVA tenderness EXT: Normal ROM in all joints; non-tender to palpation; no edema; normal capillary refill; no cyanosis, no calf  tenderness or swelling    SKIN: Normal color for age and race; warm; no rash NEURO: Moves all extremities equally PSYCH: The patient's mood and manner are appropriate. Grooming and personal hygiene are appropriate.  MEDICAL DECISION MAKING: Patient here with vaginal discharge.  Pelvic culture sent.  Wet prep positive for bacterial vaginosis and trichomonas.  She has been covered empirically for Trichomonas, chlamydia and gonorrhea.  We will also discharged with prescription of Flagyl  for the next week for her BV.  We have also sent blood work for HIV and syphilis.  She has a PCP for follow-up but will also give GYN follow-up.  Abdominal exam is benign.  Doubt PID, TOA, torsion based on her benign pelvic exam.  Nothing at this time to suggest appendicitis.  Discussed with her that she needs to avoid vaginal, oral, anal intercourse until both her and all sexual partners have been tested and treated and have waited at least 1 week after treatment.  Patient comfortable with this plan.  At this time, I do not feel there is any life-threatening condition present. I have reviewed and discussed all results (EKG, imaging, lab, urine as appropriate) and exam findings with patient/family. I have reviewed nursing notes and appropriate previous records.  I feel the patient is safe to be discharged home without further emergent workup and can continue workup as an outpatient as needed. Discussed usual and customary return precautions. Patient/family verbalize understanding and are comfortable with this plan.  Outpatient follow-up has been provided if needed. All questions have been answered.      Skya Mccullum, Delice Bison, DO 11/21/17 [phone]

## 2017-11-22 ENCOUNTER — Encounter (HOSPITAL_COMMUNITY): Payer: Self-pay | Admitting: Emergency Medicine

## 2017-11-22 ENCOUNTER — Other Ambulatory Visit: Payer: Self-pay

## 2017-11-22 ENCOUNTER — Emergency Department (HOSPITAL_COMMUNITY)
Admission: EM | Admit: 2017-11-22 | Discharge: 2017-11-22 | Disposition: A | Discharge status: Home / Self Care | Payer: Medicaid Other | Attending: Emergency Medicine | Admitting: Emergency Medicine

## 2017-11-22 DIAGNOSIS — N760 Acute vaginitis: Secondary | ICD-10-CM | POA: Diagnosis not present

## 2017-11-22 DIAGNOSIS — A5901 Trichomonal vulvovaginitis: Secondary | ICD-10-CM | POA: Insufficient documentation

## 2017-11-22 DIAGNOSIS — Z87891 Personal history of nicotine dependence: Secondary | ICD-10-CM | POA: Diagnosis not present

## 2017-11-22 DIAGNOSIS — B9689 Other specified bacterial agents as the cause of diseases classified elsewhere: Secondary | ICD-10-CM | POA: Insufficient documentation

## 2017-11-22 DIAGNOSIS — N898 Other specified noninflammatory disorders of vagina: Secondary | ICD-10-CM

## 2017-11-22 MED ORDER — ONDANSETRON HCL 4 MG PO TABS: 4.0000 mg | ORAL_TABLET | Freq: Three times a day (TID) | ORAL | 0 refills | Status: DC | PRN

## 2017-11-22 NOTE — ED Triage Notes (Signed)
Pt. Stated, I was here on Thursday and was given medication and did not help so I did not get the Rx filled. Im still having vaginal itching and really aggravating.

## 2017-11-22 NOTE — ED Provider Notes (Signed)
[facility] EMERGENCY DEPARTMENT Provider Note   CSN: [id number] Arrival date & time: 11/22/17  4268     History   Chief Complaint Chief Complaint  Patient presents with  . Vaginal Itching     HPI   Blood pressure (!) 158/105, pulse 88, temperature 98.7 F (37.1 C), temperature source Oral, resp. rate 18, height 5\' 4"  (1.626 m), weight 99.8 kg (220 lb), last menstrual period 10/21/2017, SpO2 98 %.  Sara Walsh is a 32 y.o. female complaining of persistent vaginal itching after being seen here approximately 48 hours ago.  Per chart review the pelvic was performed, wet prep with clue cells and Trichomonas, she was given prophylactic treatment for gonorrhea, chlamydia she was given a prescription for Flagyl which she did not fill because she states that it will not be helpful to her, she has not followed up with either primary care or OB/GYN.  No fever, chills, nausea, vomiting or abdominal pain just persistent vaginal itching and discomfort.  Past Medical History:  Diagnosis Date  . Chlamydia   . Fibroid   . Obesity   . Scabies   . Trichomonas infection     There are no active problems to display for this patient.   Past Surgical History:  Procedure Laterality Date  . CESAREAN SECTION     x3    OB History    Gravida Para Term Preterm AB Living   6 3 3  0 2 3   SAB TAB Ectopic Multiple Live Births   1 1 0 0         Home Medications    Prior to Admission medications   Medication Sig Start Date End Date Taking? Authorizing Provider  acyclovir (ZOVIRAX) 800 MG tablet Take 1 tablet (800 mg total) by mouth 5 (five) times daily. Patient not taking: Reported on 11/21/2017 06/16/17   Barnet Glasgow, NP  amLODipine (NORVASC) 5 MG tablet Take 1 tablet (5 mg total) by mouth daily. Patient not taking: Reported on 11/21/2017 10/06/16   Lysbeth Penner, FNP  doxycycline (VIBRAMYCIN) 100 MG capsule Take 1 capsule (100 mg total) by mouth 2 (two) times  daily. Patient not taking: Reported on 11/21/2017 10/11/15   Seabron Spates, CNM  ibuprofen (ADVIL,MOTRIN) 600 MG tablet Take 1 tablet (600 mg total) by mouth once. Patient not taking: Reported on 11/21/2017 10/11/15   Seabron Spates, CNM  metroNIDAZOLE (FLAGYL) 500 MG tablet Take 1 tablet (500 mg total) by mouth 2 (two) times daily. 11/21/17   Ward, Delice Bison, DO    Family History Family History  Problem Relation Age of Onset  . Hypertension Father   . Anesthesia problems Neg Hx     Social History Social History   Tobacco Use  . Smoking status: Former Smoker    Types: Cigars, Cigarettes    Last attempt to quit: 09/09/2011    Years since quitting: 6.2  . Smokeless tobacco: Never Used  Substance Use Topics  . Alcohol use: No  . Drug use: No    Comment: weekends     Allergies   Patient has no known allergies.   Review of Systems Review of Systems  A complete review of systems was obtained and all systems are negative except as noted in the HPI and PMH.   Physical Exam Updated Vital Signs BP (!) 158/105 (BP Location: Right Arm)   Pulse 88   Temp 98.7 F (37.1 C) (Oral)   Resp 18  Ht 5\' 4"  (1.626 m)   Wt 99.8 kg (220 lb)   LMP 10/21/2017   SpO2 98%   BMI 37.76 kg/m   Physical Exam  Constitutional: She is oriented to person, place, and time. She appears well-developed and well-nourished. No distress.  HENT:  Head: Normocephalic.  Eyes: Conjunctivae and EOM are normal.  Cardiovascular: Normal rate.  Pulmonary/Chest: Effort normal. No stridor.  Musculoskeletal: Normal range of motion.  Neurological: She is alert and oriented to person, place, and time.  Psychiatric: She has a normal mood and affect.  Nursing note and vitals reviewed.    ED Treatments / Results  Labs (all labs ordered are listed, but only abnormal results are displayed) Labs Reviewed - No data to display  EKG  EKG Interpretation None       Radiology No results  found.  Procedures Procedures (including critical care time)  Medications Ordered in ED Medications - No data to display   Initial Impression / Assessment and Plan / ED Course  I have reviewed the triage vital signs and the nursing notes.  Pertinent labs & imaging results that were available during my care of the patient were reviewed by me and considered in my medical decision making (see chart for details).     Vitals:   11/22/17 0921  BP: (!) 158/105  Pulse: 88  Resp: 18  Temp: 98.7 F (37.1 C)  TempSrc: Oral  SpO2: 98%  Weight: 99.8 kg (220 lb)  Height: 5\' 4"  (1.626 m)    Sara Walsh is 32 y.o. female presenting with persistent vaginal itching, patient was diagnosed within the last 48 hours with BV and trichomoniasis, she has not filled her prescription for Flagyl, vital signs reassuring, patient is only reporting persistent vaginal itching, I have attempted multiple times to explain to her that she will need to continue to take her medication as directed and to completion in order to achieve symptom resolution.  I do not think there is any emergent condition that requires intervention at this time, patient is insistent that the medication will not help her.  As metronidazole is the only curative option for trichomonas I have encouraged her to fill the prescription for Flagyl.  Referral given to OB/GYN and primary care.  Evaluation does not show pathology that would require ongoing emergent intervention or inpatient treatment. Pt is hemodynamically stable and mentating appropriately. Discussed findings and plan with patient/guardian, who agrees with care plan. All questions answered. Return precautions discussed and outpatient follow up given.    Final Clinical Impressions(s) / ED Diagnoses   Final diagnoses:  BV (bacterial vaginosis)  Vaginal itching  Trichomoniasis of vagina    ED Discharge Orders    None       Karen Kays Charna Elizabeth 11/22/17 1041     Davonna Belling, MD 11/22/17 1554

## 2017-11-22 NOTE — Discharge Instructions (Signed)
Go to women's outpatient clinic and consider filling the prescription which will help you with your discomfort

## 2018-01-07 ENCOUNTER — Inpatient Hospital Stay (HOSPITAL_COMMUNITY)
Admission: AD | Admit: 2018-01-07 | Discharge: 2018-01-08 | Disposition: A | Discharge status: Home / Self Care | Payer: Medicaid Other | Source: Ambulatory Visit | Attending: Obstetrics and Gynecology | Admitting: Obstetrics and Gynecology

## 2018-01-07 ENCOUNTER — Encounter (HOSPITAL_COMMUNITY): Payer: Self-pay

## 2018-01-07 DIAGNOSIS — O99211 Obesity complicating pregnancy, first trimester: Secondary | ICD-10-CM | POA: Diagnosis not present

## 2018-01-07 DIAGNOSIS — R112 Nausea with vomiting, unspecified: Secondary | ICD-10-CM | POA: Diagnosis present

## 2018-01-07 DIAGNOSIS — Z79899 Other long term (current) drug therapy: Secondary | ICD-10-CM | POA: Insufficient documentation

## 2018-01-07 DIAGNOSIS — D259 Leiomyoma of uterus, unspecified: Secondary | ICD-10-CM | POA: Diagnosis present

## 2018-01-07 DIAGNOSIS — R109 Unspecified abdominal pain: Secondary | ICD-10-CM

## 2018-01-07 DIAGNOSIS — Z87891 Personal history of nicotine dependence: Secondary | ICD-10-CM | POA: Diagnosis not present

## 2018-01-07 DIAGNOSIS — O219 Vomiting of pregnancy, unspecified: Secondary | ICD-10-CM | POA: Insufficient documentation

## 2018-01-07 DIAGNOSIS — Z8759 Personal history of other complications of pregnancy, childbirth and the puerperium: Secondary | ICD-10-CM

## 2018-01-07 DIAGNOSIS — N760 Acute vaginitis: Secondary | ICD-10-CM

## 2018-01-07 DIAGNOSIS — B9689 Other specified bacterial agents as the cause of diseases classified elsewhere: Secondary | ICD-10-CM | POA: Diagnosis present

## 2018-01-07 DIAGNOSIS — Z3A01 Less than 8 weeks gestation of pregnancy: Secondary | ICD-10-CM | POA: Diagnosis not present

## 2018-01-07 DIAGNOSIS — O10911 Unspecified pre-existing hypertension complicating pregnancy, first trimester: Secondary | ICD-10-CM | POA: Diagnosis not present

## 2018-01-07 DIAGNOSIS — R102 Pelvic and perineal pain: Secondary | ICD-10-CM | POA: Insufficient documentation

## 2018-01-07 DIAGNOSIS — Z98891 History of uterine scar from previous surgery: Secondary | ICD-10-CM

## 2018-01-07 DIAGNOSIS — O26899 Other specified pregnancy related conditions, unspecified trimester: Secondary | ICD-10-CM

## 2018-01-07 DIAGNOSIS — O26891 Other specified pregnancy related conditions, first trimester: Secondary | ICD-10-CM | POA: Diagnosis not present

## 2018-01-07 NOTE — MAU Note (Signed)
Pt states she is pregnant but has not taken a upt at home. States she started vomiting yesterday, she has vomited 4-5 times today. Pt denies diarrhea or fever. States she has chills. Pt denies vaginal bleeding or discharge. Pt denies pain.

## 2018-01-08 ENCOUNTER — Other Ambulatory Visit: Payer: Self-pay | Admitting: Advanced Practice Midwife

## 2018-01-08 ENCOUNTER — Inpatient Hospital Stay (HOSPITAL_COMMUNITY): Payer: Medicaid Other

## 2018-01-08 ENCOUNTER — Encounter (HOSPITAL_COMMUNITY): Payer: Self-pay | Admitting: Advanced Practice Midwife

## 2018-01-08 DIAGNOSIS — Z3A01 Less than 8 weeks gestation of pregnancy: Secondary | ICD-10-CM | POA: Diagnosis not present

## 2018-01-08 DIAGNOSIS — D259 Leiomyoma of uterus, unspecified: Secondary | ICD-10-CM | POA: Diagnosis present

## 2018-01-08 DIAGNOSIS — O26891 Other specified pregnancy related conditions, first trimester: Secondary | ICD-10-CM | POA: Diagnosis not present

## 2018-01-08 DIAGNOSIS — O219 Vomiting of pregnancy, unspecified: Secondary | ICD-10-CM

## 2018-01-08 DIAGNOSIS — R112 Nausea with vomiting, unspecified: Secondary | ICD-10-CM | POA: Diagnosis present

## 2018-01-08 DIAGNOSIS — N760 Acute vaginitis: Secondary | ICD-10-CM

## 2018-01-08 DIAGNOSIS — R109 Unspecified abdominal pain: Secondary | ICD-10-CM | POA: Diagnosis not present

## 2018-01-08 DIAGNOSIS — B9689 Other specified bacterial agents as the cause of diseases classified elsewhere: Secondary | ICD-10-CM | POA: Diagnosis present

## 2018-01-08 DIAGNOSIS — Z8759 Personal history of other complications of pregnancy, childbirth and the puerperium: Secondary | ICD-10-CM

## 2018-01-08 DIAGNOSIS — Z98891 History of uterine scar from previous surgery: Secondary | ICD-10-CM

## 2018-01-08 LAB — WET PREP, GENITAL
Sperm: NONE SEEN
TRICH WET PREP: NONE SEEN
YEAST WET PREP: NONE SEEN

## 2018-01-08 LAB — URINALYSIS, ROUTINE W REFLEX MICROSCOPIC
Bilirubin Urine: NEGATIVE
Glucose, UA: NEGATIVE mg/dL
Hgb urine dipstick: NEGATIVE
KETONES UR: 80 mg/dL — AB
LEUKOCYTES UA: NEGATIVE
NITRITE: NEGATIVE
PROTEIN: NEGATIVE mg/dL
Specific Gravity, Urine: 1.026 (ref 1.005–1.030)
pH: 7 (ref 5.0–8.0)

## 2018-01-08 LAB — CBC
HEMATOCRIT: 38.2 % (ref 36.0–46.0)
HEMOGLOBIN: 12.8 g/dL (ref 12.0–15.0)
MCH: 23.3 pg — ABNORMAL LOW (ref 26.0–34.0)
MCHC: 33.5 g/dL (ref 30.0–36.0)
MCV: 69.5 fL — ABNORMAL LOW (ref 78.0–100.0)
Platelets: 291 10*3/uL (ref 150–400)
RBC: 5.5 MIL/uL — ABNORMAL HIGH (ref 3.87–5.11)
RDW: 16.6 % — AB (ref 11.5–15.5)
WBC: 9.8 10*3/uL (ref 4.0–10.5)

## 2018-01-08 LAB — HIV ANTIBODY (ROUTINE TESTING W REFLEX): HIV Screen 4th Generation wRfx: NONREACTIVE

## 2018-01-08 LAB — POCT PREGNANCY, URINE: PREG TEST UR: POSITIVE — AB

## 2018-01-08 LAB — HCG, QUANTITATIVE, PREGNANCY: hCG, Beta Chain, Quant, S: 37134 m[IU]/mL — ABNORMAL HIGH (ref <5)

## 2018-01-08 MED ORDER — LABETALOL HCL 200 MG PO TABS: 200.0000 mg | ORAL_TABLET | Freq: Two times a day (BID) | ORAL | 3 refills | Status: DC

## 2018-01-08 MED ORDER — METRONIDAZOLE 500 MG PO TABS: 500.0000 mg | ORAL_TABLET | Freq: Two times a day (BID) | ORAL | 0 refills | Status: AC

## 2018-01-08 MED ORDER — LACTATED RINGERS IV SOLN
INTRAVENOUS | Status: DC
  Administered 2018-01-08: 02:00:00 via INTRAVENOUS

## 2018-01-08 MED ORDER — ONDANSETRON 4 MG PO TBDP
4.0000 mg | ORAL_TABLET | Freq: Once | ORAL | Status: AC
  Administered 2018-01-08: 4 mg via ORAL
  Filled 2018-01-08: qty 1

## 2018-01-08 MED ORDER — PROMETHAZINE HCL 25 MG PO TABS: 25.0000 mg | ORAL_TABLET | Freq: Four times a day (QID) | ORAL | 2 refills | Status: DC | PRN

## 2018-01-08 NOTE — Progress Notes (Signed)
Not taking BP med Will Rx Labetalol Stop Norvasc for now, not taking anyway Seabron Spates, CNM

## 2018-01-08 NOTE — MAU Note (Signed)
Pt reports a pain that sometimes occurs on her left side. Pt also reports peeing on herself frequently.

## 2018-01-08 NOTE — MAU Provider Note (Signed)
Chief Complaint: Possible Pregnancy and Fatigue   First Provider Initiated Contact with Patient 01/08/18 0148        SUBJECTIVE HPI: Sara Walsh is a 32 y.o. [id number] at [redacted]w[redacted]d by LMP who presents to maternity admissions reporting two weeks of lower abdominal cramping and vomiting which started yesterday.  States it feels more severe than usual pregnancy vomiting. Has chills at times. . She denies vaginal bleeding, vaginal itching/burning, urinary symptoms, h/a, dizziness, n/v, or fever/chills.   Planning on having an abortion, because she was told it would be dangerous to have another baby after multiple C/S.  Also not sure if she wants more children. Marland Kitchen  Possible Pregnancy  This is a new problem. The current episode started in the past 7 days. The problem occurs constantly. The problem has been unchanged. Associated symptoms include abdominal pain, chills, nausea and vomiting. Pertinent negatives include no fever or headaches. Nothing aggravates the symptoms. She has tried nothing for the symptoms.  Abdominal Pain  This is a new problem. The current episode started 1 to 4 weeks ago. The problem has been unchanged. The pain is located in the suprapubic region, LLQ and RLQ. The quality of the pain is cramping. The abdominal pain does not radiate. Associated symptoms include nausea and vomiting. Pertinent negatives include no constipation, diarrhea, dysuria, fever or headaches. Nothing aggravates the pain. The pain is relieved by nothing. She has tried nothing for the symptoms.    RN Note: Pt states she is pregnant but has not taken a upt at home. States she started vomiting yesterday, she has vomited 4-5 times today. Pt denies diarrhea or fever. States she has chills. Pt denies vaginal bleeding or discharge. Pt denies pain.     Past Medical History:  Diagnosis Date  . Chlamydia   . Fibroid   . Obesity   . Scabies   . Trichomonas infection    Past Surgical History:  Procedure  Laterality Date  . CESAREAN SECTION     x3   Social History   Socioeconomic History  . Marital status: Single    Spouse name: Not on file  . Number of children: Not on file  . Years of education: Not on file  . Highest education level: Not on file  Social Needs  . Financial resource strain: Not on file  . Food insecurity - worry: Not on file  . Food insecurity - inability: Not on file  . Transportation needs - medical: Not on file  . Transportation needs - non-medical: Not on file  Occupational History  . Not on file  Tobacco Use  . Smoking status: Former Smoker    Types: Cigars, Cigarettes    Last attempt to quit: 09/09/2011    Years since quitting: 6.3  . Smokeless tobacco: Never Used  Substance and Sexual Activity  . Alcohol use: No  . Drug use: Yes    Types: Marijuana    Comment: weekends  . Sexual activity: Yes    Birth control/protection: None, Pill  Other Topics Concern  . Not on file  Social History Narrative  . Not on file   No current facility-administered medications on file prior to encounter.    Current Outpatient Medications on File Prior to Encounter  Medication Sig Dispense Refill  . acyclovir (ZOVIRAX) 800 MG tablet Take 1 tablet (800 mg total) by mouth 5 (five) times daily. (Patient not taking: Reported on 11/21/2017) 35 tablet 0  . amLODipine (NORVASC) 5 MG tablet Take 1  tablet (5 mg total) by mouth daily. (Patient not taking: Reported on 11/21/2017) 30 tablet 0  . doxycycline (VIBRAMYCIN) 100 MG capsule Take 1 capsule (100 mg total) by mouth 2 (two) times daily. (Patient not taking: Reported on 11/21/2017) 28 capsule 0  . ibuprofen (ADVIL,MOTRIN) 600 MG tablet Take 1 tablet (600 mg total) by mouth once. (Patient not taking: Reported on 11/21/2017) 30 tablet 0  . metroNIDAZOLE (FLAGYL) 500 MG tablet Take 1 tablet (500 mg total) by mouth 2 (two) times daily. 14 tablet 0  . ondansetron (ZOFRAN) 4 MG tablet Take 1 tablet (4 mg total) by mouth every 8 (eight)  hours as needed for nausea or vomiting. 10 tablet 0   No Known Allergies  I have reviewed patient's Past Medical Hx, Surgical Hx, Family Hx, Social Hx, medications and allergies.   ROS:  Review of Systems  Constitutional: Positive for chills. Negative for fever.  Gastrointestinal: Positive for abdominal pain, nausea and vomiting. Negative for constipation and diarrhea.  Genitourinary: Negative for dysuria.  Neurological: Negative for headaches.   Review of Systems  Other systems negative   Physical Exam  Physical Exam Patient Vitals for the past 24 hrs:  BP Temp Temp src Pulse Resp SpO2 Height Weight  01/07/18 2351 (!) 164/103 98.6 F (37 C) Oral 84 19 100 % 5\' 4"  (1.626 m) 253 lb (114.8 kg)   Constitutional: Well-developed, well-nourished female in no acute distress.  Cardiovascular: normal rate Respiratory: normal effort GI: Abd soft, non-tender. Pos BS x 4 MS: Extremities nontender, no edema, normal ROM Neurologic: Alert and oriented x 4.  GU: Neg CVAT.  PELVIC EXAM: Cervix pink, visually closed, without lesion, scant white creamy discharge, vaginal walls and external genitalia normal Bimanual exam: Cervix 0/long/high, firm, anterior, neg CMT, uterus is tender, enlarged, adnexa without tenderness, enlargement, or mass   Irregular surface to uterus, c/w fibroid   LAB RESULTS Results for orders placed or performed during the hospital encounter of 01/07/18 (from the past 24 hour(s))  Urinalysis, Routine w reflex microscopic     Status: Abnormal   Collection Time: 01/07/18 11:54 PM  Result Value Ref Range   Color, Urine YELLOW YELLOW   APPearance HAZY (A) CLEAR   Specific Gravity, Urine 1.026 1.005 - 1.030   pH 7.0 5.0 - 8.0   Glucose, UA NEGATIVE NEGATIVE mg/dL   Hgb urine dipstick NEGATIVE NEGATIVE   Bilirubin Urine NEGATIVE NEGATIVE   Ketones, ur 80 (A) NEGATIVE mg/dL   Protein, ur NEGATIVE NEGATIVE mg/dL   Nitrite NEGATIVE NEGATIVE   Leukocytes, UA NEGATIVE  NEGATIVE  Pregnancy, urine POC     Status: Abnormal   Collection Time: 01/08/18 12:06 AM  Result Value Ref Range   Preg Test, Ur POSITIVE (A) NEGATIVE  Wet prep, genital     Status: Abnormal   Collection Time: 01/08/18  1:52 AM  Result Value Ref Range   Yeast Wet Prep HPF POC NONE SEEN NONE SEEN   Trich, Wet Prep NONE SEEN NONE SEEN   Clue Cells Wet Prep HPF POC PRESENT (A) NONE SEEN   WBC, Wet Prep HPF POC FEW (A) NONE SEEN   Sperm NONE SEEN   CBC     Status: Abnormal   Collection Time: 01/08/18  2:20 AM  Result Value Ref Range   WBC 9.8 4.0 - 10.5 K/uL   RBC 5.50 (H) 3.87 - 5.11 MIL/uL   Hemoglobin 12.8 12.0 - 15.0 g/dL   HCT 38.2 36.0 - 46.0 %  MCV 69.5 (L) 78.0 - 100.0 fL   MCH 23.3 (L) 26.0 - 34.0 pg   MCHC 33.5 30.0 - 36.0 g/dL   RDW 16.6 (H) 11.5 - 15.5 %   Platelets 291 150 - 400 K/uL    IMAGING No results found.  MAU Management/MDM: Ordered usual first trimester r/o ectopic labs.   Pelvic exam and cultures done Will check baseline Ultrasound to rule out ectopic.  This bleeding/pain can represent a normal pregnancy with bleeding, spontaneous abortion or even an ectopic which can be life-threatening.  The process as listed above helps to determine which of these is present.  Will hydrate with IV fluids and give Zofran for nausea, as patient is driving Felt better after fluids.  Will think about what to do with pregnancy Followup with MD re: HTN.  ASSESSMENT Single IUP at [redacted]w[redacted]d by LMP Vomiting, may be gastroenteritis vs pregnancy related Pelvic pain, likely from fibroid or Gastroenteritis Chronic hypertension, not taking meds (Norvasc)  PLAN Discharge home Recommend followup with primary doctor Recommend taking BP meds, Rx sent for Labetalol incase she decided to keep pregnancy Rx Phenergan for home use If decides to keep pregnancy, start [facility] asap Pt stable at time of discharge. Encouraged to return here or to other Urgent Care/ED if she develops worsening  of symptoms, increase in pain, fever, or other concerning symptoms.    Hansel Feinstein CNM, MSN Certified Nurse-Midwife 01/08/2018  1:48 AM

## 2018-01-08 NOTE — Discharge Instructions (Signed)
Nausea and Vomiting, Adult Feeling sick to your stomach (nausea) means that your stomach is upset or you feel like you have to throw up (vomit). Feeling more and more sick to your stomach can lead to throwing up. Throwing up happens when food and liquid from your stomach are thrown up and out the mouth. Throwing up can make you feel weak and cause you to get dehydrated. Dehydration can make you tired and thirsty, make you have a dry mouth, and make it so you pee (urinate) less often. Older adults and people with other diseases or a weak defense system (immune system) are at higher risk for dehydration. If you feel sick to your stomach or if you throw up, it is important to follow instructions from your doctor about how to take care of yourself. Follow these instructions at home: Eating and drinking Follow these instructions as told by your doctor:  Take an oral rehydration solution (ORS). This is a drink that is sold at pharmacies and stores.  Drink clear fluids in small amounts as you are able, such as: ? Water. ? Ice chips. ? Diluted fruit juice. ? Low-calorie sports drinks.  Eat bland, easy-to-digest foods in small amounts as you are able, such as: ? Bananas. ? Applesauce. ? Rice. ? Low-fat (lean) meats. ? Toast. ? Crackers.  Avoid fluids that have a lot of sugar or caffeine in them.  Avoid alcohol.  Avoid spicy or fatty foods.  General instructions  Drink enough fluid to keep your pee (urine) clear or pale yellow.  Wash your hands often. If you cannot use soap and water, use hand sanitizer.  Make sure that all people in your home wash their hands well and often.  Take over-the-counter and prescription medicines only as told by your doctor.  Rest at home while you get better.  Watch your condition for any changes.  Breathe slowly and deeply when you feel sick to your stomach.  Keep all follow-up visits as told by your doctor. This is important. Contact a doctor  if:  You have a fever.  You cannot keep fluids down.  Your symptoms get worse.  You have new symptoms.  You feel sick to your stomach for more than two days.  You feel light-headed or dizzy.  You have a headache.  You have muscle cramps. Get help right away if:  You have pain in your chest, neck, arm, or jaw.  You feel very weak or you pass out (faint).  You throw up again and again.  You see blood in your throw-up.  Your throw-up looks like black coffee grounds.  You have bloody or black poop (stools) or poop that look like tar.  You have a very bad headache, a stiff neck, or both.  You have a rash.  You have very bad pain, cramping, or bloating in your belly (abdomen).  You have trouble breathing.  You are breathing very quickly.  Your heart is beating very quickly.  Your skin feels cold and clammy.  You feel confused.  You have pain when you pee.  You have signs of dehydration, such as: ? Dark pee, hardly any pee, or no pee. ? Cracked lips. ? Dry mouth. ? Sunken eyes. ? Sleepiness. ? Weakness. These symptoms may be an emergency. Do not wait to see if the symptoms will go away. Get medical help right away. Call your local emergency services (911 in the [place] Do not drive yourself to the hospital. This information is  not intended to replace advice given to you by your health care provider. Make sure you discuss any questions you have with your health care provider. Document Released: 04/01/2008 Document Revised: 05/03/2016 Document Reviewed: 06/20/2015 Elsevier Interactive Patient Education  2018 [place] of Pregnancy The first trimester of pregnancy is from week 1 until the end of week 13 (months 1 through 3). During this time, your baby will begin to develop inside you. At 6-8 weeks, the eyes and face are formed, and the heartbeat can be seen on ultrasound. At the end of 12 weeks, all the baby's organs are formed. Prenatal care is  all the medical care you receive before the birth of your baby. Make sure you get good prenatal care and follow all of your doctor's instructions. Follow these instructions at home: Medicines  Take over-the-counter and prescription medicines only as told by your doctor. Some medicines are safe and some medicines are not safe during pregnancy.  Take a prenatal vitamin that contains at least 600 micrograms (mcg) of folic acid.  If you have trouble pooping (constipation), take medicine that will make your stool soft (stool softener) if your doctor approves. Eating and drinking  Eat regular, healthy meals.  Your doctor will tell you the amount of weight gain that is right for you.  Avoid raw meat and uncooked cheese.  If you feel sick to your stomach (nauseous) or throw up (vomit): ? Eat 4 or 5 small meals a day instead of 3 large meals. ? Try eating a few soda crackers. ? Drink liquids between meals instead of during meals.  To prevent constipation: ? Eat foods that are high in fiber, like fresh fruits and vegetables, whole grains, and beans. ? Drink enough fluids to keep your pee (urine) clear or pale yellow. Activity  Exercise only as told by your doctor. Stop exercising if you have cramps or pain in your lower belly (abdomen) or low back.  Do not exercise if it is too hot, too humid, or if you are in a place of great height (high altitude).  Try to avoid standing for long periods of time. Move your legs often if you must stand in one place for a long time.  Avoid heavy lifting.  Wear low-heeled shoes. Sit and stand up straight.  You can have sex unless your doctor tells you not to. Relieving pain and discomfort  Wear a good support bra if your breasts are sore.  Take warm water baths (sitz baths) to soothe pain or discomfort caused by hemorrhoids. Use hemorrhoid cream if your doctor says it is okay.  Rest with your legs raised if you have leg cramps or low back pain.  If  you have puffy, bulging veins (varicose veins) in your legs: ? Wear support hose or compression stockings as told by your doctor. ? Raise (elevate) your feet for 15 minutes, 3-4 times a day. ? Limit salt in your food. Prenatal care  Schedule your prenatal visits by the twelfth week of pregnancy.  Write down your questions. Take them to your prenatal visits.  Keep all your prenatal visits as told by your doctor. This is important. Safety  Wear your seat belt at all times when driving.  Make a list of emergency phone numbers. The list should include numbers for family, friends, the hospital, and police and fire departments. General instructions  Ask your doctor for a referral to a local prenatal class. Begin classes no later than at the start  of month 6 of your pregnancy.  Ask for help if you need counseling or if you need help with nutrition. Your doctor can give you advice or tell you where to go for help.  Do not use hot tubs, steam rooms, or saunas.  Do not douche or use tampons or scented sanitary pads.  Do not cross your legs for long periods of time.  Avoid all herbs and alcohol. Avoid drugs that are not approved by your doctor.  Do not use any tobacco products, including cigarettes, chewing tobacco, and electronic cigarettes. If you need help quitting, ask your doctor. You may get counseling or other support to help you quit.  Avoid cat litter boxes and soil used by cats. These carry germs that can cause birth defects in the baby and can cause a loss of your baby (miscarriage) or stillbirth.  Visit your dentist. At home, brush your teeth with a soft toothbrush. Be gentle when you floss. Contact a doctor if:  You are dizzy.  You have mild cramps or pressure in your lower belly.  You have a nagging pain in your belly area.  You continue to feel sick to your stomach, you throw up, or you have watery poop (diarrhea).  You have a bad smelling fluid coming from your  vagina.  You have pain when you pee (urinate).  You have increased puffiness (swelling) in your face, hands, legs, or ankles. Get help right away if:  You have a fever.  You are leaking fluid from your vagina.  You have spotting or bleeding from your vagina.  You have very bad belly cramping or pain.  You gain or lose weight rapidly.  You throw up blood. It may look like coffee grounds.  You are around people who have [place] measles, fifth disease, or chickenpox.  You have a very bad headache.  You have shortness of breath.  You have any kind of trauma, such as from a fall or a car accident. Summary  The first trimester of pregnancy is from week 1 until the end of week 13 (months 1 through 3).  To take care of yourself and your unborn baby, you will need to eat healthy meals, take medicines only if your doctor tells you to do so, and do activities that are safe for you and your baby.  Keep all follow-up visits as told by your doctor. This is important as your doctor will have to ensure that your baby is healthy and growing well. This information is not intended to replace advice given to you by your health care provider. Make sure you discuss any questions you have with your health care provider. Document Released: 04/01/2008 Document Revised: 10/22/2016 Document Reviewed: 10/22/2016 Elsevier Interactive Patient Education  2017 [employer].

## 2018-01-09 LAB — GC/CHLAMYDIA PROBE AMP (~~LOC~~) NOT AT ARMC
CHLAMYDIA, DNA PROBE: NEGATIVE
Neisseria Gonorrhea: NEGATIVE

## 2018-02-17 NOTE — ED Notes (Signed)
02/18/2108, Pt.called for her STD results from March,, Results reviewed with pt. All Questions answered.

## 2018-03-03 ENCOUNTER — Encounter (HOSPITAL_COMMUNITY): Payer: Self-pay | Admitting: Emergency Medicine

## 2018-03-03 ENCOUNTER — Ambulatory Visit (HOSPITAL_COMMUNITY)
Admission: EM | Admit: 2018-03-03 | Discharge: 2018-03-03 | Disposition: A | Discharge status: Home / Self Care | Payer: Medicaid Other | Attending: Family Medicine | Admitting: Family Medicine

## 2018-03-03 DIAGNOSIS — Z9889 Other specified postprocedural states: Secondary | ICD-10-CM | POA: Diagnosis not present

## 2018-03-03 DIAGNOSIS — Z202 Contact with and (suspected) exposure to infections with a predominantly sexual mode of transmission: Secondary | ICD-10-CM | POA: Insufficient documentation

## 2018-03-03 DIAGNOSIS — R300 Dysuria: Secondary | ICD-10-CM | POA: Insufficient documentation

## 2018-03-03 DIAGNOSIS — Z8249 Family history of ischemic heart disease and other diseases of the circulatory system: Secondary | ICD-10-CM | POA: Diagnosis not present

## 2018-03-03 DIAGNOSIS — Z87891 Personal history of nicotine dependence: Secondary | ICD-10-CM | POA: Insufficient documentation

## 2018-03-03 DIAGNOSIS — Z79899 Other long term (current) drug therapy: Secondary | ICD-10-CM | POA: Insufficient documentation

## 2018-03-03 DIAGNOSIS — R1032 Left lower quadrant pain: Secondary | ICD-10-CM | POA: Diagnosis not present

## 2018-03-03 LAB — COMPREHENSIVE METABOLIC PANEL
ALT: 13 U/L — ABNORMAL LOW (ref 14–54)
ANION GAP: 8 (ref 5–15)
AST: 15 U/L (ref 15–41)
Albumin: 3.7 g/dL (ref 3.5–5.0)
Alkaline Phosphatase: 75 U/L (ref 38–126)
BUN: 12 mg/dL (ref 6–20)
CHLORIDE: 107 mmol/L (ref 101–111)
CO2: 24 mmol/L (ref 22–32)
Calcium: 9.2 mg/dL (ref 8.9–10.3)
Creatinine, Ser: 0.96 mg/dL (ref 0.44–1.00)
GFR calc non Af Amer: >60 mL/min (ref >60)
Glucose, Bld: 97 mg/dL (ref 65–99)
POTASSIUM: 3.8 mmol/L (ref 3.5–5.1)
SODIUM: 139 mmol/L (ref 135–145)
Total Bilirubin: 1.2 mg/dL (ref 0.3–1.2)
Total Protein: 7.1 g/dL (ref 6.5–8.1)

## 2018-03-03 LAB — POCT URINALYSIS DIP (DEVICE)
GLUCOSE, UA: NEGATIVE mg/dL
Hgb urine dipstick: NEGATIVE
KETONES UR: NEGATIVE mg/dL
Leukocytes, UA: NEGATIVE
Nitrite: NEGATIVE
PROTEIN: 30 mg/dL — AB
Specific Gravity, Urine: 1.025 (ref 1.005–1.030)
Urobilinogen, UA: 0.2 mg/dL (ref 0.0–1.0)
pH: 5.5 (ref 5.0–8.0)

## 2018-03-03 LAB — POCT PREGNANCY, URINE: PREG TEST UR: NEGATIVE

## 2018-03-03 LAB — LIPASE, BLOOD: Lipase: 30 U/L (ref 11–51)

## 2018-03-03 MED ORDER — POLYETHYLENE GLYCOL 3350 17 G PO PACK: 17.0000 g | PACK | Freq: Every day | ORAL | 0 refills | Status: DC

## 2018-03-03 MED ORDER — METRONIDAZOLE 500 MG PO TABS: 500.0000 mg | ORAL_TABLET | Freq: Two times a day (BID) | ORAL | 0 refills | Status: DC

## 2018-03-03 MED ORDER — FLUCONAZOLE 150 MG PO TABS: 150.0000 mg | ORAL_TABLET | Freq: Every day | ORAL | 0 refills | Status: DC

## 2018-03-03 NOTE — ED Triage Notes (Signed)
Pt sts some lower abd discomfort; pt needs STD screening

## 2018-03-03 NOTE — ED Provider Notes (Signed)
[place]    CSN: [id number] Arrival date & time: 03/03/18  1011     History   Chief Complaint Chief Complaint  Patient presents with  . Exposure to STD    HPI Sara Walsh is a 32 y.o. female.   32 year old female comes in for 3 to 4-day history of abdominal pressure.  States pressures around periumbilical, left lower quadrant area.  Symptoms were intermittent, and now more persistent.  No obvious aggravating or alleviating factor.  She still eating and drinking without problems. Denies nausea, vomiting, diarrhea.  Last bowel movement yesterday, without straining or hard stools. Denies fever, chills, night sweats. She has had some dysuria without frequency.  Has noticed some darker urine this past few days despite well hydration.  She does have vaginal discharge, itching denies vaginal bleeding/spotting. She has history of uterine fibroids.  Had a surgical abortion in March, has not had a cycle since.  She is sexually active with one female partner, no condom use.  States her partner recently was tested  positive for trichomonas.     Past Medical History:  Diagnosis Date  . Chlamydia   . Fibroid   . Obesity   . Scabies   . Trichomonas infection     Patient Active Problem List   Diagnosis Date Noted  . Previous cesarean section 01/08/2018  . Previous abortion 01/08/2018  . Uterine fibroid 01/08/2018  . Abdominal pain during pregnancy in first trimester 01/08/2018  . Nausea & vomiting 01/08/2018  . Bacterial vaginosis 01/08/2018    Past Surgical History:  Procedure Laterality Date  . CESAREAN SECTION     x3    OB History    Gravida  10   Para  4   Term  4   Preterm  0   AB  5   Living  4     SAB  1   TAB  2   Ectopic  0   Multiple  0   Live Births               Home Medications    Prior to Admission medications   Medication Sig Start Date End Date Taking? Authorizing Provider  acyclovir (ZOVIRAX) 800 MG tablet Take 1  tablet (800 mg total) by mouth 5 (five) times daily. Patient not taking: Reported on 11/21/2017 06/16/17   Barnet Glasgow, NP  amLODipine (NORVASC) 5 MG tablet Take 1 tablet (5 mg total) by mouth daily. Patient not taking: Reported on 11/21/2017 10/06/16   Lysbeth Penner, FNP  doxycycline (VIBRAMYCIN) 100 MG capsule Take 1 capsule (100 mg total) by mouth 2 (two) times daily. Patient not taking: Reported on 11/21/2017 10/11/15   Seabron Spates, CNM  fluconazole (DIFLUCAN) 150 MG tablet Take 1 tablet (150 mg total) by mouth daily. Take second dose 72 hours later if symptoms still persists. 03/03/18   Tasia Catchings, Amy V, PA-C  labetalol (NORMODYNE) 200 MG tablet Take 1 tablet (200 mg total) by mouth 2 (two) times daily. 01/08/18   Seabron Spates, CNM  metroNIDAZOLE (FLAGYL) 500 MG tablet Take 1 tablet (500 mg total) by mouth 2 (two) times daily. 03/03/18   Tasia Catchings, Amy V, PA-C  ondansetron (ZOFRAN) 4 MG tablet Take 1 tablet (4 mg total) by mouth every 8 (eight) hours as needed for nausea or vomiting. 11/22/17   Pisciotta, Elmyra Ricks, PA-C  polyethylene glycol (MIRALAX) packet Take 17 g by mouth daily. 03/03/18   Ok Edwards, PA-C  promethazine (PHENERGAN) 25 MG tablet Take 0.5 tablets (12.5 mg total) by mouth every 6 (six) hours as needed for nausea. 09/23/11 09/30/11  Ashley Murrain, NP  promethazine (PHENERGAN) 25 MG tablet Take 1 tablet (25 mg total) by mouth every 6 (six) hours as needed for nausea or vomiting. 01/08/18   Seabron Spates, CNM    Family History Family History  Problem Relation Age of Onset  . Hypertension Father   . Anesthesia problems Neg Hx     Social History Social History   Tobacco Use  . Smoking status: Former Smoker    Types: Cigars, Cigarettes    Last attempt to quit: 09/09/2011    Years since quitting: 6.4  . Smokeless tobacco: Never Used  Substance Use Topics  . Alcohol use: No  . Drug use: Yes    Types: Marijuana    Comment: weekends     Allergies   Patient has no known  allergies.   Review of Systems Review of Systems  Reason unable to perform ROS: See HPI as above.     Physical Exam Triage Vital Signs ED Triage Vitals [03/03/18 1117]  Enc Vitals Group     BP 132/90     Pulse Rate 81     Resp 18     Temp 99.6 F (37.6 C)     Temp Source Oral     SpO2 100 %     Weight      Height      Head Circumference      Peak Flow      Pain Score      Pain Loc      Pain Edu?      Excl. in [place]?    No data found.  Updated Vital Signs BP 132/90 (BP Location: Left Arm)   Pulse 81   Temp 99.6 F (37.6 C) (Oral)   Resp 18   LMP  (LMP Unknown)   SpO2 100%   Breastfeeding? Unknown   Physical Exam  Constitutional: She is oriented to person, place, and time. She appears well-developed and well-nourished. No distress.  HENT:  Head: Normocephalic and atraumatic.  Eyes: Pupils are equal, round, and reactive to light. Conjunctivae are normal.  Neck: Normal range of motion. Neck supple.  Cardiovascular: Normal rate, regular rhythm and normal heart sounds. Exam reveals no gallop and no friction rub.  No murmur heard. Pulmonary/Chest: Effort normal and breath sounds normal. She has no wheezes. She has no rales.  Abdominal: Soft. Bowel sounds are normal. She exhibits no mass. There is no rebound, no guarding and no CVA tenderness.  Mild tenderness to palpation of LLQ, suprapubic area  Genitourinary: There is no rash, tenderness or lesion on the right labia. There is no rash, tenderness or lesion on the left labia.  Genitourinary Comments: Creamy discharge around the vagina and cervix. No CMT. No obvious tenderness to palpation of the uterus. No obvious adnexal mass, tenderness.   Neurological: She is alert and oriented to person, place, and time.  Skin: Skin is warm and dry.  Psychiatric: She has a normal mood and affect. Her behavior is normal. Judgment normal.     UC Treatments / Results  Labs (all labs ordered are listed, but only abnormal results  are displayed) Labs Reviewed  POCT URINALYSIS DIP (DEVICE) - Abnormal; Notable for the following components:      Result Value   Bilirubin Urine SMALL (*)    Protein, ur 30 (*)  All other components within normal limits  COMPREHENSIVE METABOLIC PANEL  LIPASE, BLOOD  POCT PREGNANCY, URINE  CERVICOVAGINAL ANCILLARY ONLY    EKG None  Radiology No results found.  Procedures Procedures (including critical care time)  Medications Ordered in UC Medications - No data to display  Initial Impression / Assessment and Plan / UC Course  I have reviewed the triage vital signs and the nursing notes.  Pertinent labs & imaging results that were available during my care of the patient were reviewed by me and considered in my medical decision making (see chart for details).    No alarming signs on exam. Given small bilirubin in urine, with patient commenting on darker urine, will obtain CMP, lipase for further evaluation. Discussed with patient cannot rule out ovarian/uterine causes of abdominal pressure. Will treat for trich and yeast empirically. Cytology sent for further evaluation. Patient also to try miralax for possible constipation causing pressure at LLQ. Patient to follow up with GYN for further evaluation of LLQ abdominal pressure. Otherwise, will discharge patient in stable condition, pending CMP/lipase. Return precautions given. Patient expresses understanding and agrees to plan.  CMP and lipase without alarming values. Will have patient continue plan and follow up with GYN for further evaluation needed.   Final Clinical Impressions(s) / UC Diagnoses   Final diagnoses:  Abdominal pain, left lower quadrant    ED Prescriptions    Medication Sig Dispense Auth. Provider   polyethylene glycol (MIRALAX) packet Take 17 g by mouth daily. 14 each Yu, Amy V, PA-C   fluconazole (DIFLUCAN) 150 MG tablet Take 1 tablet (150 mg total) by mouth daily. Take second dose 72 hours later if  symptoms still persists. 2 tablet Yu, Amy V, PA-C   metroNIDAZOLE (FLAGYL) 500 MG tablet Take 1 tablet (500 mg total) by mouth 2 (two) times daily. 14 tablet Tobin Chad, [place] 03/03/18 1448

## 2018-03-03 NOTE — Discharge Instructions (Signed)
No alarming signs on exam. As discussed, cannot rule out ovary/uterus causes of low abdominal pain. Will have you start miralax to increase bowel movement in case that is the cause of pressure. Blood work drawn to check liver, kidney and pancrease function for dark urine, we will give you a call once results are in. You were treated empirically for yeast and trichomonas. Start flagyl and diflucan as directed. This will also cover for bacterial vaginitis. Cytology sent, you will be contacted with any positive results that requires further treatment. Refrain from sexual activity and alcohol use for the next 7 days. Please follow up with your GYN for further evaluation needed. If experiencing worsening abdominal pain, nausea/vomiting, unable to jump up and down due to pain, go to the emergency department for further evaluation needed.

## 2018-03-04 LAB — CERVICOVAGINAL ANCILLARY ONLY
BACTERIAL VAGINITIS: POSITIVE — AB
CANDIDA VAGINITIS: NEGATIVE
CHLAMYDIA, DNA PROBE: NEGATIVE
NEISSERIA GONORRHEA: NEGATIVE
TRICH (WINDOWPATH): NEGATIVE

## 2018-03-06 ENCOUNTER — Telehealth (HOSPITAL_COMMUNITY): Payer: Self-pay

## 2018-03-06 NOTE — Telephone Encounter (Signed)
Bacterial Vaginosis test is positive.  Prescription for metronidazole was given at the urgent care visit. Pt contacted regarding results. Answered all questions. Verbalized understanding. All labs are within normal range.

## 2018-05-13 ENCOUNTER — Emergency Department (HOSPITAL_COMMUNITY)
Admission: EM | Admit: 2018-05-13 | Discharge: 2018-05-13 | Disposition: A | Discharge status: Home / Self Care | Payer: Medicaid Other | Attending: Emergency Medicine | Admitting: Emergency Medicine

## 2018-05-13 ENCOUNTER — Emergency Department (HOSPITAL_COMMUNITY): Payer: Medicaid Other

## 2018-05-13 ENCOUNTER — Encounter (HOSPITAL_COMMUNITY): Payer: Self-pay | Admitting: Emergency Medicine

## 2018-05-13 ENCOUNTER — Other Ambulatory Visit: Payer: Self-pay

## 2018-05-13 DIAGNOSIS — Z3A22 22 weeks gestation of pregnancy: Secondary | ICD-10-CM | POA: Insufficient documentation

## 2018-05-13 DIAGNOSIS — O23599 Infection of other part of genital tract in pregnancy, unspecified trimester: Secondary | ICD-10-CM | POA: Insufficient documentation

## 2018-05-13 DIAGNOSIS — O219 Vomiting of pregnancy, unspecified: Secondary | ICD-10-CM | POA: Diagnosis not present

## 2018-05-13 DIAGNOSIS — Z87891 Personal history of nicotine dependence: Secondary | ICD-10-CM | POA: Insufficient documentation

## 2018-05-13 DIAGNOSIS — B9689 Other specified bacterial agents as the cause of diseases classified elsewhere: Secondary | ICD-10-CM | POA: Diagnosis not present

## 2018-05-13 DIAGNOSIS — N760 Acute vaginitis: Secondary | ICD-10-CM

## 2018-05-13 LAB — HCG, QUANTITATIVE, PREGNANCY: HCG, BETA CHAIN, QUANT, S: 49784 m[IU]/mL — AB (ref <5)

## 2018-05-13 LAB — URINALYSIS, ROUTINE W REFLEX MICROSCOPIC
Bilirubin Urine: NEGATIVE
GLUCOSE, UA: NEGATIVE mg/dL
KETONES UR: 5 mg/dL — AB
Leukocytes, UA: NEGATIVE
Nitrite: NEGATIVE
PROTEIN: 30 mg/dL — AB
Specific Gravity, Urine: 1.027 (ref 1.005–1.030)
pH: 6 (ref 5.0–8.0)

## 2018-05-13 LAB — CBC
HCT: 42 % (ref 36.0–46.0)
Hemoglobin: 13.2 g/dL (ref 12.0–15.0)
MCH: 22.5 pg — AB (ref 26.0–34.0)
MCHC: 31.4 g/dL (ref 30.0–36.0)
MCV: 71.7 fL — ABNORMAL LOW (ref 78.0–100.0)
PLATELETS: 301 10*3/uL (ref 150–400)
RBC: 5.86 MIL/uL — ABNORMAL HIGH (ref 3.87–5.11)
RDW: 17.3 % — ABNORMAL HIGH (ref 11.5–15.5)
WBC: 6.7 10*3/uL (ref 4.0–10.5)

## 2018-05-13 LAB — COMPREHENSIVE METABOLIC PANEL
ALBUMIN: 3.3 g/dL — AB (ref 3.5–5.0)
ALK PHOS: 66 U/L (ref 38–126)
ALT: 12 U/L (ref 0–44)
AST: 14 U/L — AB (ref 15–41)
Anion gap: 9 (ref 5–15)
BUN: 8 mg/dL (ref 6–20)
CALCIUM: 8.9 mg/dL (ref 8.9–10.3)
CHLORIDE: 105 mmol/L (ref 98–111)
CO2: 25 mmol/L (ref 22–32)
CREATININE: 0.86 mg/dL (ref 0.44–1.00)
GFR calc Af Amer: >60 mL/min (ref >60)
GFR calc non Af Amer: >60 mL/min (ref >60)
GLUCOSE: 89 mg/dL (ref 70–99)
Potassium: 3.6 mmol/L (ref 3.5–5.1)
SODIUM: 139 mmol/L (ref 135–145)
Total Bilirubin: 0.9 mg/dL (ref 0.3–1.2)
Total Protein: 6.8 g/dL (ref 6.5–8.1)

## 2018-05-13 LAB — WET PREP, GENITAL
SPERM: NONE SEEN
TRICH WET PREP: NONE SEEN
Yeast Wet Prep HPF POC: NONE SEEN

## 2018-05-13 LAB — I-STAT BETA HCG BLOOD, ED (MC, WL, AP ONLY): I-stat hCG, quantitative: >2000 m[IU]/mL — ABNORMAL HIGH (ref <5)

## 2018-05-13 LAB — ABO/RH: ABO/RH(D): O POS

## 2018-05-13 LAB — LIPASE, BLOOD: Lipase: 33 U/L (ref 11–51)

## 2018-05-13 MED ORDER — SODIUM CHLORIDE 0.9 % IV BOLUS
1000.0000 mL | Freq: Once | INTRAVENOUS | Status: AC
  Administered 2018-05-13: 1000 mL via INTRAVENOUS

## 2018-05-13 NOTE — ED Notes (Signed)
Pt going to  Ultrasound at this time

## 2018-05-13 NOTE — ED Notes (Signed)
Pt  Was able to keep sandwich and water down without throwing up ; pt denies any n/v at this time

## 2018-05-13 NOTE — Discharge Instructions (Addendum)
For morning sickness and nausea and vomiting in pregnancy you may take Diclegis.  This is an expensive prescription.  The active ingredients are the same as taking unisom or a similar brand (Doxylamine succinate) and vitamin B6 (Pyridoxine hydrochloride).  Please make sure the active ingredient matches the same name as there are many types of unisom and generic medications.  You may take 12.5 of unisom (or generic) every 4-6 hours, if needed you can take up to 25mg .  Please do not take more than 75mg  in 24 hours.   For the vitamin B6 (pyridoxine) you may take 10mg  up to twice a day.   Please make sure you are not drinking alcohol and not using any drugs.  Please take a prenatal vitamin daily.    Your ultrasound today showed that you are approximately 8 weeks and 0 days pregnant.

## 2018-05-13 NOTE — ED Notes (Signed)
Pt back from Ultrasound

## 2018-05-13 NOTE — ED Triage Notes (Signed)
Pt reports n/v for the past few weeks, states LMP was in may and she has had some spotting this month but no period in June. Pt denies any birth control use, states it is possible she is pregnant.

## 2018-05-13 NOTE — ED Notes (Signed)
Patient is now in the hall way bed got patient a warm blanket

## 2018-05-13 NOTE — ED Provider Notes (Signed)
[facility] EMERGENCY DEPARTMENT Provider Note   CSN: [id number] Arrival date & time: 05/13/18  5188     History   Chief Complaint Chief Complaint  Patient presents with  . Emesis    HPI Sara Walsh is a 32 y.o. female G11P4 s/p 6 elective abortions, who presents today for evaluation of nausea and vomiting for the past week.  She reports that her last menstrual cycle was in June.  She reports that her nausea and vomiting is gotten significantly worse over the past week.  She denies any trauma.  She does report that yesterday she started having midline lower abdominal pain and cramping.  She also reports that she is having very light spotting since yesterday.  She reports that her nausea and vomiting have felt like morning sickness, however she has not taken any home pregnancy test yet.  She reports that her last pregnancy was in November 2018 for which she had an elective abortion.  HPI  Past Medical History:  Diagnosis Date  . Chlamydia   . Fibroid   . Obesity   . Scabies   . Trichomonas infection     Patient Active Problem List   Diagnosis Date Noted  . Previous cesarean section 01/08/2018  . Previous abortion 01/08/2018  . Uterine fibroid 01/08/2018  . Abdominal pain during pregnancy in first trimester 01/08/2018  . Nausea & vomiting 01/08/2018  . Bacterial vaginosis 01/08/2018    Past Surgical History:  Procedure Laterality Date  . CESAREAN SECTION     x3     OB History    Gravida  11   Para  4   Term  4   Preterm  0   AB  6   Living  4     SAB  0   TAB  3   Ectopic  0   Multiple  0   Live Births  4            Home Medications    Prior to Admission medications   Medication Sig Start Date End Date Taking? Authorizing Provider  amLODipine (NORVASC) 5 MG tablet Take 1 tablet (5 mg total) by mouth daily. Patient not taking: Reported on 11/21/2017 10/06/16   Lysbeth Penner, FNP  labetalol (NORMODYNE) 200 MG  tablet Take 1 tablet (200 mg total) by mouth 2 (two) times daily. Patient not taking: Reported on 05/13/2018 01/08/18   Seabron Spates, CNM  ondansetron (ZOFRAN) 4 MG tablet Take 1 tablet (4 mg total) by mouth every 8 (eight) hours as needed for nausea or vomiting. Patient not taking: Reported on 05/13/2018 11/22/17   Pisciotta, Elmyra Ricks, PA-C  polyethylene glycol [facility]) packet Take 17 g by mouth daily. Patient not taking: Reported on 05/13/2018 03/03/18   Ok Edwards, PA-C  promethazine (PHENERGAN) 25 MG tablet Take 0.5 tablets (12.5 mg total) by mouth every 6 (six) hours as needed for nausea. Patient not taking: Reported on 05/13/2018 09/23/11 09/30/11  Ashley Murrain, NP  promethazine (PHENERGAN) 25 MG tablet Take 1 tablet (25 mg total) by mouth every 6 (six) hours as needed for nausea or vomiting. Patient not taking: Reported on 05/13/2018 01/08/18   Seabron Spates, CNM    Family History Family History  Problem Relation Age of Onset  . Hypertension Father   . Anesthesia problems Neg Hx     Social History Social History   Tobacco Use  . Smoking status: Former Smoker    Types:  Cigars, Cigarettes    Last attempt to quit: 09/09/2011    Years since quitting: 6.6  . Smokeless tobacco: Never Used  Substance Use Topics  . Alcohol use: No  . Drug use: Yes    Types: Marijuana    Comment: weekends     Allergies   Patient has no known allergies.   Review of Systems Review of Systems  Constitutional: Negative for chills and fever.  Gastrointestinal: Positive for nausea and vomiting. Negative for abdominal pain, constipation and diarrhea.  Genitourinary: Positive for pelvic pain, vaginal bleeding and vaginal pain.  All other systems reviewed and are negative.    Physical Exam Updated Vital Signs BP 120/82   Pulse 66   Temp 99.5 F (37.5 C) (Oral)   Resp 18   LMP 03/09/2018 (Approximate)   SpO2 100%   Physical Exam  Constitutional: She is oriented to person, place, and time.  She appears well-developed and well-nourished. No distress.  HENT:  Head: Normocephalic and atraumatic.  Eyes: Conjunctivae are normal. Right eye exhibits no discharge. Left eye exhibits no discharge. No scleral icterus.  Neck: Normal range of motion. Neck supple.  Cardiovascular: Normal rate and regular rhythm.  Pulmonary/Chest: Effort normal. No stridor. No respiratory distress.  Abdominal: Soft. Bowel sounds are normal. She exhibits no distension. There is no tenderness. There is no guarding.  Genitourinary:  Genitourinary Comments: Sterile exam performed with chaperone in room.  Cervical os is closed, there is scant blood in the vaginal canal.  There is no adnexal fullness or tenderness bilaterally.  There is no cervical motion tenderness.  Musculoskeletal: She exhibits no edema or deformity.  Neurological: She is alert and oriented to person, place, and time. She exhibits normal muscle tone.  Skin: Skin is warm and dry. She is not diaphoretic.  Psychiatric: She has a normal mood and affect. Her behavior is normal.  Nursing note and vitals reviewed.    ED Treatments / Results  Labs (all labs ordered are listed, but only abnormal results are displayed) Labs Reviewed  WET PREP, GENITAL - Abnormal; Notable for the following components:      Result Value   Clue Cells Wet Prep HPF POC PRESENT (*)    WBC, Wet Prep HPF POC MODERATE (*)    All other components within normal limits  COMPREHENSIVE METABOLIC PANEL - Abnormal; Notable for the following components:   Albumin 3.3 (*)    AST 14 (*)    All other components within normal limits  CBC - Abnormal; Notable for the following components:   RBC 5.86 (*)    MCV 71.7 (*)    MCH 22.5 (*)    RDW 17.3 (*)    All other components within normal limits  URINALYSIS, ROUTINE W REFLEX MICROSCOPIC - Abnormal; Notable for the following components:   Color, Urine AMBER (*)    APPearance HAZY (*)    Hgb urine dipstick LARGE (*)    Ketones, ur  5 (*)    Protein, ur 30 (*)    Bacteria, UA FEW (*)    All other components within normal limits  HCG, QUANTITATIVE, PREGNANCY - Abnormal; Notable for the following components:   hCG, Beta Chain, Quant, S [id number] (*)    All other components within normal limits  I-STAT BETA HCG BLOOD, ED (MC, WL, AP ONLY) - Abnormal; Notable for the following components:   I-stat hCG, quantitative >2,000.0 (*)    All other components within normal limits  LIPASE, BLOOD  RPR  HIV ANTIBODY (ROUTINE TESTING)  ABO/RH  GC/CHLAMYDIA PROBE AMP ([place]) NOT AT [facility]    EKG None  Radiology US Ob Comp < 14 Wks  Result Date: 05/13/2018 CLINICAL DATA:  Spotting EXAM: OBSTETRIC <14 WK [place] AND TRANSVAGINAL OB US TECHNIQUE: Both transabdominal and transvaginal ultrasound examinations were performed for complete evaluation of the gestation as well as the maternal uterus, adnexal regions, and pelvic cul-de-sac. Transvaginal technique was performed to assess early pregnancy. COMPARISON:  None. FINDINGS: Intrauterine gestational sac: Single Yolk sac:  Not visualized Embryo:  Visualized Cardiac Activity: Visualized Heart Rate: 178 bpm MSD:   mm    w     d CRL:  15.8 mm   8 w   0 d                  [place] EDC: 12/23/2018 Subchorionic hemorrhage:  None visualized. Maternal uterus/adnexae: No adnexal mass.  8 cm fibroid noted. IMPRESSION: Eight week intrauterine pregnancy. Fetal heart rate 178 beats per minute. No acute maternal findings. 8 cm uterine fibroid. Electronically Signed   By: Rolm Baptise M.D.   On: 05/13/2018 11:33   US Ob Transvaginal  Result Date: 05/13/2018 CLINICAL DATA:  Spotting EXAM: OBSTETRIC <14 WK [place] AND TRANSVAGINAL OB US TECHNIQUE: Both transabdominal and transvaginal ultrasound examinations were performed for complete evaluation of the gestation as well as the maternal uterus, adnexal regions, and pelvic cul-de-sac. Transvaginal technique was performed to assess early pregnancy. COMPARISON:  None.  FINDINGS: Intrauterine gestational sac: Single Yolk sac:  Not visualized Embryo:  Visualized Cardiac Activity: Visualized Heart Rate: 178 bpm MSD:   mm    w     d CRL:  15.8 mm   8 w   0 d                  [place] EDC: 12/23/2018 Subchorionic hemorrhage:  None visualized. Maternal uterus/adnexae: No adnexal mass.  8 cm fibroid noted. IMPRESSION: Eight week intrauterine pregnancy. Fetal heart rate 178 beats per minute. No acute maternal findings. 8 cm uterine fibroid. Electronically Signed   By: Rolm Baptise M.D.   On: 05/13/2018 11:33    Procedures Procedures (including critical care time)  Medications Ordered in ED Medications  sodium chloride 0.9 % bolus 1,000 mL (0 mLs Intravenous Stopped 05/13/18 1310)     Initial Impression / Assessment and Plan / ED Course  I have reviewed the triage vital signs and the nursing notes.  Pertinent labs & imaging results that were available during my care of the patient were reviewed by me and considered in my medical decision making (see chart for details).  Clinical Course as of May 13 1542  Wed May 13, 2018  1543 [facility] on [address]   [EH]    Clinical Course User Index [EH] Ollen Gross   Sara Walsh presents today for evaluation of emesis.  Labs showed she is pregnant explaining the emesis.  She is not currently vomiting on my exam.  Her abdomen is soft, non tender, non distended.  She does have reported spotting, ABO rh showing O+, not Rhogam candidate.  She has not had ultrasounds for this pregnancy, therefore ultrasound ordered.  Ultrasound shows single intrauterine gestation approximately 8 weeks and 0 days.  This is consistent with hCG quant of approximately 50,000.  Exam was performed and swabs for gonorrhea chlamydia were collected along with wet prep.  Wet prep with clue cells consistent with BV.  As  patient is pregnant we will treat this despite her being asymptomatic.  Urine is contaminated, large blood, no  leukocytes or nitrites, she is not having urinary symptoms, therefore not suspicious for cystitis.    Patient given general pregnancy information.  Follow up with women's outpatient clinic.  Patient discharged home.   Final Clinical Impressions(s) / ED Diagnoses   Final diagnoses:  Nausea and vomiting in pregnancy prior to [redacted] weeks gestation    ED Discharge Orders    None    Flagyl called into [facility] on [address].  Patient is aware.    Lorin Glass, PA-C 05/13/18 1559    Nat Christen, MD 05/14/18 [phone]

## 2018-05-14 LAB — HIV ANTIBODY (ROUTINE TESTING W REFLEX): HIV SCREEN 4TH GENERATION: NONREACTIVE

## 2018-05-14 LAB — RPR: RPR: NONREACTIVE

## 2018-05-15 LAB — GC/CHLAMYDIA PROBE AMP (~~LOC~~) NOT AT ARMC
CHLAMYDIA, DNA PROBE: NEGATIVE
NEISSERIA GONORRHEA: NEGATIVE

## 2018-05-18 ENCOUNTER — Encounter (HOSPITAL_COMMUNITY): Payer: Self-pay | Admitting: *Deleted

## 2018-05-18 ENCOUNTER — Inpatient Hospital Stay (HOSPITAL_COMMUNITY)
Admission: AD | Admit: 2018-05-18 | Discharge: 2018-05-19 | Disposition: A | Discharge status: Home / Self Care | Payer: Medicaid Other | Source: Ambulatory Visit | Attending: Obstetrics and Gynecology | Admitting: Obstetrics and Gynecology

## 2018-05-18 ENCOUNTER — Inpatient Hospital Stay (HOSPITAL_COMMUNITY): Payer: Medicaid Other

## 2018-05-18 DIAGNOSIS — D259 Leiomyoma of uterus, unspecified: Secondary | ICD-10-CM | POA: Insufficient documentation

## 2018-05-18 DIAGNOSIS — R112 Nausea with vomiting, unspecified: Secondary | ICD-10-CM

## 2018-05-18 DIAGNOSIS — Z87891 Personal history of nicotine dependence: Secondary | ICD-10-CM | POA: Diagnosis not present

## 2018-05-18 DIAGNOSIS — Z3A10 10 weeks gestation of pregnancy: Secondary | ICD-10-CM | POA: Diagnosis not present

## 2018-05-18 DIAGNOSIS — O208 Other hemorrhage in early pregnancy: Secondary | ICD-10-CM | POA: Insufficient documentation

## 2018-05-18 DIAGNOSIS — Z8249 Family history of ischemic heart disease and other diseases of the circulatory system: Secondary | ICD-10-CM | POA: Diagnosis not present

## 2018-05-18 DIAGNOSIS — O99211 Obesity complicating pregnancy, first trimester: Secondary | ICD-10-CM | POA: Diagnosis not present

## 2018-05-18 DIAGNOSIS — O3411 Maternal care for benign tumor of corpus uteri, first trimester: Secondary | ICD-10-CM | POA: Diagnosis not present

## 2018-05-18 DIAGNOSIS — O210 Mild hyperemesis gravidarum: Secondary | ICD-10-CM | POA: Diagnosis not present

## 2018-05-18 DIAGNOSIS — O209 Hemorrhage in early pregnancy, unspecified: Secondary | ICD-10-CM | POA: Diagnosis present

## 2018-05-18 DIAGNOSIS — E669 Obesity, unspecified: Secondary | ICD-10-CM | POA: Diagnosis not present

## 2018-05-18 DIAGNOSIS — I10 Essential (primary) hypertension: Secondary | ICD-10-CM | POA: Diagnosis present

## 2018-05-18 LAB — URINALYSIS, ROUTINE W REFLEX MICROSCOPIC
BACTERIA UA: NONE SEEN
Bilirubin Urine: NEGATIVE
Glucose, UA: NEGATIVE mg/dL
HGB URINE DIPSTICK: NEGATIVE
Ketones, ur: 80 mg/dL — AB
Leukocytes, UA: NEGATIVE
NITRITE: NEGATIVE
PROTEIN: 30 mg/dL — AB
Specific Gravity, Urine: 1.03 (ref 1.005–1.030)
pH: 5 (ref 5.0–8.0)

## 2018-05-18 LAB — CBC
HCT: 43.1 % (ref 36.0–46.0)
HEMOGLOBIN: 14.4 g/dL (ref 12.0–15.0)
MCH: 23.5 pg — AB (ref 26.0–34.0)
MCHC: 33.4 g/dL (ref 30.0–36.0)
MCV: 70.3 fL — ABNORMAL LOW (ref 78.0–100.0)
Platelets: 308 10*3/uL (ref 150–400)
RBC: 6.13 MIL/uL — ABNORMAL HIGH (ref 3.87–5.11)
RDW: 16.7 % — AB (ref 11.5–15.5)
WBC: 8.2 10*3/uL (ref 4.0–10.5)

## 2018-05-18 LAB — HCG, QUANTITATIVE, PREGNANCY: HCG, BETA CHAIN, QUANT, S: 59870 m[IU]/mL — AB (ref <5)

## 2018-05-18 MED ORDER — PROMETHAZINE HCL 25 MG/ML IJ SOLN
25.0000 mg | Freq: Once | INTRAMUSCULAR | Status: AC
  Administered 2018-05-18: 25 mg via INTRAVENOUS
  Filled 2018-05-18: qty 1

## 2018-05-18 MED ORDER — GLYCOPYRROLATE 1 MG PO TABS
1.0000 mg | ORAL_TABLET | Freq: Once | ORAL | Status: AC
  Administered 2018-05-18: 1 mg via ORAL
  Filled 2018-05-18: qty 1

## 2018-05-18 MED ORDER — M.V.I. ADULT IV INJ
Freq: Once | INTRAVENOUS | Status: AC
  Administered 2018-05-18: 23:00:00 via INTRAVENOUS
  Filled 2018-05-18: qty 1000

## 2018-05-18 NOTE — MAU Note (Signed)
Urine in lab 

## 2018-05-18 NOTE — MAU Note (Signed)
WENT  TO [facility] ON 7-17-   FOR  VOMITING  AND BLEEDING-   DREW LABS  AND U/S.    VAG BLEEDING.  IN  TRIAGE -  PAD ON - LARGE AMT  RED  BLOOD.    NO CRAMPS- BUT  FEELS  HEAVINESS.

## 2018-05-18 NOTE — MAU Provider Note (Signed)
History     CSN: [id number]  Arrival date and time: 05/18/18 8099   First Provider Initiated Contact with Patient 05/18/18 1951      Chief Complaint  Patient presents with  . Vaginal Bleeding   HPI  Ms.  Sara Walsh is a 32 y.o. year old [id number] female at [redacted]w[redacted]d weeks gestation who presents to MAU reporting heavy VB with blood clots. She was seen at [facility] on 05/13/18 for N/V & VB. She had labs drawn and an ultrasound while there. She denies any abdominal pain, but reports "heaviness". She also complains of "spitting all of the time."  Past Medical History:  Diagnosis Date  . Chlamydia   . Fibroid   . Obesity   . Scabies   . Trichomonas infection     Past Surgical History:  Procedure Laterality Date  . CESAREAN SECTION     x3    Family History  Problem Relation Age of Onset  . Hypertension Father   . Anesthesia problems Neg Hx     Social History   Tobacco Use  . Smoking status: Former Smoker    Types: Cigars, Cigarettes    Last attempt to quit: 09/09/2011    Years since quitting: 6.6  . Smokeless tobacco: Never Used  Substance Use Topics  . Alcohol use: No  . Drug use: Yes    Types: Marijuana    Comment: weekends    Allergies: No Known Allergies  Medications Prior to Admission  Medication Sig Dispense Refill Last Dose  . amLODipine (NORVASC) 5 MG tablet Take 1 tablet (5 mg total) by mouth daily. (Patient not taking: Reported on 11/21/2017) 30 tablet 0 Not Taking at Unknown time  . labetalol (NORMODYNE) 200 MG tablet Take 1 tablet (200 mg total) by mouth 2 (two) times daily. (Patient not taking: Reported on 05/13/2018) 60 tablet 3 Not Taking at Unknown time  . ondansetron (ZOFRAN) 4 MG tablet Take 1 tablet (4 mg total) by mouth every 8 (eight) hours as needed for nausea or vomiting. (Patient not taking: Reported on 05/13/2018) 10 tablet 0 Not Taking at Unknown time  . polyethylene glycol (MIRALAX) packet Take 17 g by mouth daily. (Patient not taking:  Reported on 05/13/2018) 14 each 0 Not Taking at Unknown time  . promethazine (PHENERGAN) 25 MG tablet Take 0.5 tablets (12.5 mg total) by mouth every 6 (six) hours as needed for nausea. (Patient not taking: Reported on 05/13/2018) 20 tablet 0 Not Taking at Unknown time  . promethazine (PHENERGAN) 25 MG tablet Take 1 tablet (25 mg total) by mouth every 6 (six) hours as needed for nausea or vomiting. (Patient not taking: Reported on 05/13/2018) 30 tablet 2 Not Taking at Unknown time    Review of Systems  Constitutional: Negative.   HENT: Negative.   Eyes: Negative.   Respiratory: Negative.   Cardiovascular: Negative.   Gastrointestinal: Positive for nausea and vomiting (and spitting).  Endocrine: Negative.   Genitourinary: Positive for vaginal bleeding (heavy bleeding and clots).  Musculoskeletal: Negative.   Skin: Negative.   Allergic/Immunologic: Negative.   Neurological: Negative.   Hematological: Negative.   Psychiatric/Behavioral: Negative.    Physical Exam   Blood pressure (!) 143/86, pulse 77, temperature 98.9 F (37.2 C), temperature source Oral, resp. rate 20, height 5\' 4"  (1.626 m), weight 106.7 kg (235 lb 4 oz), last menstrual period 03/09/2018, currently breastfeeding.  Physical Exam  Nursing note and vitals reviewed. Constitutional: She is oriented to person, place, and time. She  appears well-developed and well-nourished.  HENT:  Head: Normocephalic and atraumatic.  Eyes: Pupils are equal, round, and reactive to light.  Neck: Normal range of motion.  Cardiovascular: Normal rate, regular rhythm and normal heart sounds.  Respiratory: Effort normal.  GI: Bowel sounds are normal.  Genitourinary:  Genitourinary Comments: Uterus: non-tender, SE: cervix is smooth, pink, no lesions, moderate amt of BRB and clots, closed/long/firm, no CMT or friability, no adnexal tenderness   Musculoskeletal: Normal range of motion.  Neurological: She is alert and oriented to person, place, and  time.  Skin: Skin is warm and dry.  Psychiatric: She has a normal mood and affect. Her behavior is normal. Judgment and thought content normal.    MAU Course  Procedures  MDM CCUA IVFs: Phenergan 25 mg in LR 1000 ml @ 999 ml/hr; then MVI in LR 1000 ml @ 500 ml/hr   Results for orders placed or performed during the hospital encounter of 05/18/18 (from the past 24 hour(s))  Urinalysis, Routine w reflex microscopic     Status: Abnormal   Collection Time: 05/18/18  7:55 PM  Result Value Ref Range   Color, Urine AMBER (A) YELLOW   APPearance HAZY (A) CLEAR   Specific Gravity, Urine 1.030 1.005 - 1.030   pH 5.0 5.0 - 8.0   Glucose, UA NEGATIVE NEGATIVE mg/dL   Hgb urine dipstick NEGATIVE NEGATIVE   Bilirubin Urine NEGATIVE NEGATIVE   Ketones, ur 80 (A) NEGATIVE mg/dL   Protein, ur 30 (A) NEGATIVE mg/dL   Nitrite NEGATIVE NEGATIVE   Leukocytes, UA NEGATIVE NEGATIVE   RBC / HPF 0-5 0 - 5 RBC/hpf   WBC, UA 0-5 0 - 5 WBC/hpf   Bacteria, UA NONE SEEN NONE SEEN   Squamous Epithelial / LPF 0-5 0 - 5   Mucus PRESENT   hCG, quantitative, pregnancy     Status: Abnormal   Collection Time: 05/18/18  8:41 PM  Result Value Ref Range   hCG, Beta Chain, Quant, S 59,870 (H) <5 mIU/mL  CBC     Status: Abnormal   Collection Time: 05/18/18  8:41 PM  Result Value Ref Range   WBC 8.2 4.0 - 10.5 K/uL   RBC 6.13 (H) 3.87 - 5.11 MIL/uL   Hemoglobin 14.4 12.0 - 15.0 g/dL   HCT 43.1 36.0 - 46.0 %   MCV 70.3 (L) 78.0 - 100.0 fL   MCH 23.5 (L) 26.0 - 34.0 pg   MCHC 33.4 30.0 - 36.0 g/dL   RDW 16.7 (H) 11.5 - 15.5 %   Platelets 308 150 - 400 K/uL     Assessment and Plan  Bleeding in early pregnancy  - Reviewed [facility] noted on U/S - Advised to return to MAU for bleeding that soaks a pad/hr  Intractable vomiting with nausea, unspecified vomiting type  - Rx for Reglan 10 mg and Robinul  - Discharge patient - Patient verbalized an understanding of the plan of care and agrees.   Laury Deep, MSN,  CNM 05/18/2018, 7:51 PM

## 2018-05-19 DIAGNOSIS — O209 Hemorrhage in early pregnancy, unspecified: Secondary | ICD-10-CM | POA: Diagnosis present

## 2018-05-19 MED ORDER — METOCLOPRAMIDE HCL 10 MG PO TABS: 10.0000 mg | ORAL_TABLET | Freq: Four times a day (QID) | ORAL | 0 refills | Status: DC

## 2018-05-19 MED ORDER — GLYCOPYRROLATE 1 MG PO TABS: 1.0000 mg | ORAL_TABLET | Freq: Three times a day (TID) | ORAL | 0 refills | Status: DC

## 2018-05-19 NOTE — Discharge Instructions (Signed)
Safe Medications in Pregnancy   Acne: Benzoyl Peroxide Salicylic Acid  Backache/Headache: Tylenol: 2 regular strength every 4 hours OR              2 Extra strength every 6 hours  Colds/Coughs/Allergies: Benadryl (alcohol free) 25 mg every 6 hours as needed Breath right strips Claritin Cepacol throat lozenges Chloraseptic throat spray Cold-Eeze- up to three times per day Cough drops, alcohol free Flonase (by prescription only) Guaifenesin Mucinex Robitussin DM (plain only, alcohol free) Saline nasal spray/drops Sudafed (pseudoephedrine) & Actifed ** use only after [redacted] weeks gestation and if you do not have high blood pressure Tylenol Vicks Vaporub Zinc lozenges Zyrtec   Constipation: Colace Ducolax suppositories Fleet enema Glycerin suppositories Metamucil Milk of magnesia Miralax Senokot Smooth move tea  Diarrhea: Kaopectate Imodium A-D  *NO pepto Bismol  Hemorrhoids: Anusol Anusol HC Preparation H Tucks  Indigestion: Tums Maalox Mylanta Zantac  Pepcid  Insomnia: Benadryl (alcohol free) 25mg  every 6 hours as needed Tylenol PM Unisom, no Gelcaps  Leg Cramps: Tums MagGel  Nausea/Vomiting:  Bonine Dramamine Emetrol Ginger extract Sea bands Meclizine  Nausea medication to take during pregnancy:  Unisom (doxylamine succinate 25 mg tablets) Take one tablet daily at bedtime. If symptoms are not adequately controlled, the dose can be increased to a maximum recommended dose of two tablets daily (1/2 tablet in the morning, 1/2 tablet mid-afternoon and one at bedtime). Vitamin B6 100mg  tablets. Take one tablet twice a day (up to 200 mg per day).  Skin Rashes: Aveeno products Benadryl cream or 25mg  every 6 hours as needed Calamine Lotion 1% cortisone cream  Yeast infection: Gyne-lotrimin 7 Monistat 7   **If taking multiple medications, please check labels to avoid duplicating the same active ingredients **take medication as directed on  the label ** Do not exceed 4000 mg of tylenol in 24 hours **Do not take medications that contain aspirin or ibuprofen      [place] for [employer] at [facility]       Phone: [place] for [employer] at [place] Phone: [place] for [employer] at [place]  Phone: [place] for [place] at [employer]  Phone: [place] for [employer] at [place]  Phone: [place] for [place] at [employer]: [facility]       Phone: [phone]  [facility]    Phone: [phone]   [facility] [place])    Phone: [phone]  [facility]    Phone: [phone]  [facility]    Phone: [place]    Phone: [phone]  [place] [facility]       Phone: [phone]   [place] [facility]  Phone: [place]    Phone: [phone]  [facility]   Phone: [phone]  [facility]      Phone: [phone]  [facility]    Phone: [phone]

## 2018-05-21 ENCOUNTER — Encounter (HOSPITAL_COMMUNITY): Payer: Self-pay

## 2018-05-21 ENCOUNTER — Inpatient Hospital Stay (HOSPITAL_COMMUNITY)
Admission: AD | Admit: 2018-05-21 | Discharge: 2018-05-21 | Disposition: A | Discharge status: Home / Self Care | Payer: Medicaid Other | Source: Ambulatory Visit | Attending: Obstetrics and Gynecology | Admitting: Obstetrics and Gynecology

## 2018-05-21 DIAGNOSIS — O26891 Other specified pregnancy related conditions, first trimester: Secondary | ICD-10-CM | POA: Diagnosis not present

## 2018-05-21 DIAGNOSIS — O99281 Endocrine, nutritional and metabolic diseases complicating pregnancy, first trimester: Secondary | ICD-10-CM | POA: Insufficient documentation

## 2018-05-21 DIAGNOSIS — O26899 Other specified pregnancy related conditions, unspecified trimester: Secondary | ICD-10-CM

## 2018-05-21 DIAGNOSIS — O99211 Obesity complicating pregnancy, first trimester: Secondary | ICD-10-CM | POA: Diagnosis not present

## 2018-05-21 DIAGNOSIS — O219 Vomiting of pregnancy, unspecified: Secondary | ICD-10-CM | POA: Diagnosis present

## 2018-05-21 DIAGNOSIS — Z3A10 10 weeks gestation of pregnancy: Secondary | ICD-10-CM | POA: Diagnosis not present

## 2018-05-21 DIAGNOSIS — R1013 Epigastric pain: Secondary | ICD-10-CM | POA: Insufficient documentation

## 2018-05-21 DIAGNOSIS — Z79899 Other long term (current) drug therapy: Secondary | ICD-10-CM | POA: Insufficient documentation

## 2018-05-21 DIAGNOSIS — E860 Dehydration: Secondary | ICD-10-CM | POA: Insufficient documentation

## 2018-05-21 DIAGNOSIS — Z87891 Personal history of nicotine dependence: Secondary | ICD-10-CM | POA: Diagnosis not present

## 2018-05-21 DIAGNOSIS — O99280 Endocrine, nutritional and metabolic diseases complicating pregnancy, unspecified trimester: Secondary | ICD-10-CM

## 2018-05-21 LAB — URINALYSIS, ROUTINE W REFLEX MICROSCOPIC
Bilirubin Urine: NEGATIVE
Glucose, UA: NEGATIVE mg/dL
Ketones, ur: 80 mg/dL — AB
Leukocytes, UA: NEGATIVE
Nitrite: NEGATIVE
PH: 5 (ref 5.0–8.0)
Protein, ur: 100 mg/dL — AB
SPECIFIC GRAVITY, URINE: 1.031 — AB (ref 1.005–1.030)

## 2018-05-21 LAB — BASIC METABOLIC PANEL
ANION GAP: 7 (ref 5–15)
BUN: 9 mg/dL (ref 6–20)
CHLORIDE: 107 mmol/L (ref 98–111)
CO2: 19 mmol/L — ABNORMAL LOW (ref 22–32)
Calcium: 8.6 mg/dL — ABNORMAL LOW (ref 8.9–10.3)
Creatinine, Ser: 0.6 mg/dL (ref 0.44–1.00)
GFR calc non Af Amer: >60 mL/min (ref >60)
Glucose, Bld: 75 mg/dL (ref 70–99)
POTASSIUM: 3.5 mmol/L (ref 3.5–5.1)
SODIUM: 133 mmol/L — AB (ref 135–145)

## 2018-05-21 MED ORDER — M.V.I. ADULT IV INJ
Freq: Once | INTRAVENOUS | Status: AC
  Administered 2018-05-21: 16:00:00 via INTRAVENOUS
  Filled 2018-05-21: qty 10

## 2018-05-21 MED ORDER — RANITIDINE HCL 150 MG PO TABS: 150.0000 mg | ORAL_TABLET | Freq: Two times a day (BID) | ORAL | 0 refills | Status: DC

## 2018-05-21 MED ORDER — PROMETHAZINE HCL 25 MG/ML IJ SOLN
25.0000 mg | INTRAVENOUS | Status: AC
  Administered 2018-05-21: 25 mg via INTRAVENOUS
  Filled 2018-05-21: qty 1

## 2018-05-21 MED ORDER — SCOPOLAMINE 1 MG/3DAYS TD PT72
1.0000 | MEDICATED_PATCH | TRANSDERMAL | Status: DC
  Administered 2018-05-21: 1.5 mg via TRANSDERMAL
  Filled 2018-05-21: qty 1

## 2018-05-21 MED ORDER — FAMOTIDINE IN NACL 20-0.9 MG/50ML-% IV SOLN
20.0000 mg | Freq: Once | INTRAVENOUS | Status: AC
  Administered 2018-05-21: 20 mg via INTRAVENOUS
  Filled 2018-05-21: qty 50

## 2018-05-21 MED ORDER — PROMETHAZINE HCL 25 MG PO TABS: 25.0000 mg | ORAL_TABLET | Freq: Four times a day (QID) | ORAL | 0 refills | Status: DC | PRN

## 2018-05-21 MED ORDER — ONDANSETRON 4 MG PO TBDP: 4.0000 mg | ORAL_TABLET | Freq: Three times a day (TID) | ORAL | 0 refills | Status: DC | PRN

## 2018-05-21 MED ORDER — ONDANSETRON HCL 40 MG/20ML IJ SOLN
8.0000 mg | Freq: Once | INTRAMUSCULAR | Status: AC
  Administered 2018-05-21: 8 mg via INTRAVENOUS
  Filled 2018-05-21: qty 4

## 2018-05-21 NOTE — MAU Provider Note (Signed)
Chief Complaint: Emesis   First Provider Initiated Contact with Patient 05/21/18 1342     SUBJECTIVE HPI: Sara Walsh is a 32 y.o. [id number] at [redacted]w[redacted]d who presents to Maternity Admissions reporting nausea & vomiting. Symptoms started over a week ago. Reports vomiting several times a day, specifically if she tries to eat or drink. Was prescribed robinul & reglan but states she can't keep down the medications. Vomiting bile today. States she hasn't been able to keep down any food for the last 3 days. Some epigastric pain that she describes as sore. Denies lower abdominal pain, fever/chills, vaginal bleeding, or diarrhea. Plans on going to [place] for prenatal care but hasn't made appt yet.   Past Medical History:  Diagnosis Date  . Chlamydia   . Fibroid   . Obesity   . Scabies   . Trichomonas infection    OB History  Gravida Para Term Preterm AB Living  11 4 4  0 6 4  SAB TAB Ectopic Multiple Live Births  0 3 0 0 4    # Outcome Date GA Lbr Len/2nd Weight Sex Delivery Anes PTL Lv  11 Current           10 TAB 03/23/09          9 Term      CS-Unspec     8 AB           7 AB           6 AB           5 TAB              Birth Comments: System Generated. Please review and update pregnancy details.  4 TAB           3 Term      CS-Unspec     2 Term      CS-Unspec     1 Term      CS-Unspec      Past Surgical History:  Procedure Laterality Date  . CESAREAN SECTION     x3   Social History   Socioeconomic History  . Marital status: Single    Spouse name: Not on file  . Number of children: Not on file  . Years of education: Not on file  . Highest education level: Not on file  Occupational History  . Not on file  Social Needs  . Financial resource strain: Not on file  . Food insecurity:    Worry: Not on file    Inability: Not on file  . Transportation needs:    Medical: Not on file    Non-medical: Not on file  Tobacco Use  . Smoking status: Former Smoker    Types:  Cigars, Cigarettes    Last attempt to quit: 09/09/2011    Years since quitting: 6.7  . Smokeless tobacco: Never Used  Substance and Sexual Activity  . Alcohol use: No  . Drug use: Yes    Types: Marijuana    Comment: weekends  . Sexual activity: Yes  Lifestyle  . Physical activity:    Days per week: Not on file    Minutes per session: Not on file  . Stress: Not on file  Relationships  . Social connections:    Talks on phone: Not on file    Gets together: Not on file    Attends religious service: Not on file    Active member of club or organization: Not on file  Attends meetings of clubs or organizations: Not on file    Relationship status: Not on file  . Intimate partner violence:    Fear of current or ex partner: Not on file    Emotionally abused: Not on file    Physically abused: Not on file    Forced sexual activity: Not on file  Other Topics Concern  . Not on file  Social History Narrative  . Not on file   Family History  Problem Relation Age of Onset  . Hypertension Father   . Anesthesia problems Neg Hx    No current facility-administered medications on file prior to encounter.    Current Outpatient Medications on File Prior to Encounter  Medication Sig Dispense Refill  . amLODipine (NORVASC) 5 MG tablet Take 1 tablet (5 mg total) by mouth daily. (Patient not taking: Reported on 11/21/2017) 30 tablet 0  . glycopyrrolate (ROBINUL) 1 MG tablet Take 1 tablet (1 mg total) by mouth 3 (three) times daily. 90 tablet 0  . labetalol (NORMODYNE) 200 MG tablet Take 1 tablet (200 mg total) by mouth 2 (two) times daily. (Patient not taking: Reported on 05/13/2018) 60 tablet 3  . metoCLOPramide (REGLAN) 10 MG tablet Take 1 tablet (10 mg total) by mouth every 6 (six) hours. 30 tablet 0  . ondansetron (ZOFRAN) 4 MG tablet Take 1 tablet (4 mg total) by mouth every 8 (eight) hours as needed for nausea or vomiting. (Patient not taking: Reported on 05/13/2018) 10 tablet 0  .  polyethylene glycol (MIRALAX) packet Take 17 g by mouth daily. (Patient not taking: Reported on 05/13/2018) 14 each 0  . promethazine (PHENERGAN) 25 MG tablet Take 1 tablet (25 mg total) by mouth every 6 (six) hours as needed for nausea or vomiting. (Patient not taking: Reported on 05/13/2018) 30 tablet 2   No Known Allergies  I have reviewed patient's Past Medical Hx, Surgical Hx, Family Hx, Social Hx, medications and allergies.   Review of Systems  OBJECTIVE Patient Vitals for the past 24 hrs:  BP Temp Temp src Pulse Resp Weight  05/21/18 1208 (!) 138/96 - - 92 - -  05/21/18 1149 (!) 147/97 98.8 F (37.1 C) Oral (!) 105 18 232 lb (105.2 kg)   Constitutional: Well-developed, well-nourished female in no acute distress.  Cardiovascular: normal rate & rhythm, no murmur Respiratory: normal rate and effort. Lung sounds clear throughout GI: Abd soft, non-tender, Pos BS x 4. No guarding or rebound tenderness MS: Extremities nontender, no edema, normal ROM Neurologic: Alert and oriented x 4.    LAB RESULTS Results for orders placed or performed during the hospital encounter of 05/21/18 (from the past 24 hour(s))  Urinalysis, Routine w reflex microscopic     Status: Abnormal   Collection Time: 05/21/18 11:58 AM  Result Value Ref Range   Color, Urine AMBER (A) YELLOW   APPearance HAZY (A) CLEAR   Specific Gravity, Urine 1.031 (H) 1.005 - 1.030   pH 5.0 5.0 - 8.0   Glucose, UA NEGATIVE NEGATIVE mg/dL   Hgb urine dipstick MODERATE (A) NEGATIVE   Bilirubin Urine NEGATIVE NEGATIVE   Ketones, ur 80 (A) NEGATIVE mg/dL   Protein, ur 100 (A) NEGATIVE mg/dL   Nitrite NEGATIVE NEGATIVE   Leukocytes, UA NEGATIVE NEGATIVE   RBC / HPF 0-5 0 - 5 RBC/hpf   WBC, UA 0-5 0 - 5 WBC/hpf   Bacteria, UA RARE (A) NONE SEEN   Squamous Epithelial / LPF 6-10 0 - 5  Mucus PRESENT   Basic metabolic panel     Status: Abnormal   Collection Time: 05/21/18  2:12 PM  Result Value Ref Range   Sodium 133 (L) 135  - 145 mmol/L   Potassium 3.5 3.5 - 5.1 mmol/L   Chloride 107 98 - 111 mmol/L   CO2 19 (L) 22 - 32 mmol/L   Glucose, Bld 75 70 - 99 mg/dL   BUN 9 6 - 20 mg/dL   Creatinine, Ser 0.60 0.44 - 1.00 mg/dL   Calcium 8.6 (L) 8.9 - 10.3 mg/dL   GFR calc non Af Amer >60 >60 mL/min   GFR calc Af Amer >60 >60 mL/min   Anion gap 7 5 - 15    IMAGING No results found.  MAU COURSE Orders Placed This Encounter  Procedures  . Urinalysis, Routine w reflex microscopic  . Basic metabolic panel  . Discharge patient   Meds ordered this encounter  Medications  . promethazine (PHENERGAN) 25 mg in lactated ringers 1,000 mL infusion  . famotidine (PEPCID) IVPB 20 mg premix  . multivitamins adult (MVI -12) 10 mL in dextrose 5% lactated ringers 1,000 mL infusion  . ondansetron (ZOFRAN) 8 mg in sodium chloride 0.9 % 50 mL IVPB  . scopolamine (TRANSDERM-SCOP) 1 MG/3DAYS 1.5 mg  . promethazine (PHENERGAN) 25 MG tablet    Sig: Take 1 tablet (25 mg total) by mouth every 6 (six) hours as needed for nausea or vomiting.    Dispense:  30 tablet    Refill:  0    Order Specific Question:   Supervising Provider    Answer:   Aletha Halim [id number]  . ondansetron (ZOFRAN ODT) 4 MG disintegrating tablet    Sig: Take 1 tablet (4 mg total) by mouth every 8 (eight) hours as needed for nausea or vomiting.    Dispense:  15 tablet    Refill:  0    Order Specific Question:   Supervising Provider    Answer:   Aletha Halim [id number]  . ranitidine (ZANTAC) 150 MG tablet    Sig: Take 1 tablet (150 mg total) by mouth 2 (two) times daily.    Dispense:  60 tablet    Refill:  0    Order Specific Question:   Supervising Provider    Answer:   Aletha Halim [[id number]]    MDM U/a with 80+ ketones & pt actively vomiting. IV fluids ordered. Phenergan 25 mg in 1 liter bag of LR followed by MVI in D5LR. IV pepcid 20 mg. Pt reports continued nausea after bag with phenergan. IV zofran ordered & scop patch placed.  Pt able  to tolerate ice chips and crackers.   ASSESSMENT 1. Nausea and vomiting during pregnancy prior to [redacted] weeks gestation   2. Dehydration during pregnancy     PLAN Discharge home in stable condition. Rx phenergan, zantac, & zofran Continue to wear scop patch x 72 hrs  Follow-up Information    [place]. Schedule an appointment as soon as possible for a visit.   Specialty:  Obstetrics and Gynecology Contact information: [place] [id number] [phone]         Allergies as of 05/21/2018   No Known Allergies     Medication List    STOP taking these medications   amLODipine 5 MG tablet Commonly known as:  NORVASC   labetalol 200 MG tablet Commonly known as:  NORMODYNE   ondansetron 4 MG tablet Commonly  known as:  ZOFRAN   polyethylene glycol packet Commonly known as:  MIRALAX     TAKE these medications   glycopyrrolate 1 MG tablet Commonly known as:  ROBINUL Take 1 tablet (1 mg total) by mouth 3 (three) times daily.   metoCLOPramide 10 MG tablet Commonly known as:  REGLAN Take 1 tablet (10 mg total) by mouth every 6 (six) hours.   ondansetron 4 MG disintegrating tablet Commonly known as:  ZOFRAN ODT Take 1 tablet (4 mg total) by mouth every 8 (eight) hours as needed for nausea or vomiting.   promethazine 25 MG tablet Commonly known as:  PHENERGAN Take 1 tablet (25 mg total) by mouth every 6 (six) hours as needed for nausea or vomiting.   ranitidine 150 MG tablet Commonly known as:  ZANTAC Take 1 tablet (150 mg total) by mouth 2 (two) times daily.        Jorje Guild, NP 05/21/2018  5:36 PM

## 2018-05-21 NOTE — Discharge Instructions (Signed)
Eating Plan for Hyperemesis Gravidarum °Hyperemesis gravidarum is a severe form of morning sickness. Because this condition causes severe nausea and vomiting, it can lead to dehydration, malnutrition, and weight loss. One way to lessen the symptoms of nausea and vomiting is to follow the eating plan for hyperemesis gravidarum. It is often used along with prescribed medicines to control your symptoms. °What can I do to relieve my symptoms? °Listen to your body. Everyone is different and has different preferences. Find what works best for you. Take any of the following actions that are helpful to you: °· Eat and drink slowly. °· Eat 5-6 small meals daily instead of 3 large meals. °· Eat crackers before you get out of bed in the morning. °· Try having a snack in the middle of the night. °· Starchy foods are usually tolerated well. Examples include cereal, toast, bread, potatoes, pasta, rice, and pretzels. °· Ginger may help with nausea. Add ¼ tsp ground ginger to hot tea or choose ginger tea. °· Try drinking 100% fruit juice or an electrolyte drink. An electrolyte drink contains sodium, potassium, and chloride. °· Continue to take your prenatal vitamins as told by your health care provider. If you are having trouble taking your prenatal vitamins, talk with your health care provider about different options. °· Include at least 1 serving of protein with your meals and snacks. Protein options include meats or poultry, beans, nuts, eggs, and yogurt. Try eating a protein-rich snack before bed. Examples of these snacks include cheese and crackers or half of a peanut butter or turkey sandwich. °· Consider eliminating foods that trigger your symptoms. These may include spicy foods, coffee, high-fat foods, very sweet foods, and acidic foods. °· Try meals that have more protein combined with bland, salty, lower-fat, and dry foods, such as nuts, seeds, pretzels, crackers, and cereal. °· Talk with your healthcare provider about  starting a supplement of vitamin B6. °· Have fluids that are cold, clear, and carbonated or sour. Examples include lemonade, ginger ale, lemon-lime soda, ice water, and sparkling water. °· Try lemon or mint tea. °· Try brushing your teeth or using a mouth rinse after meals. ° °What should I avoid to reduce my symptoms? °Avoiding some of the following things may help reduce your symptoms. °· Foods with strong smells. Try eating meals in well-ventilated areas that are free of odors. °· Drinking water or other beverages with meals. Try not to drink anything during the 30 minutes before and after your meals. °· Drinking more than 1 cup of fluid at a time. Sometimes using a straw helps. °· Fried or high-fat foods, such as butter and cream sauces. °· Spicy foods. °· Skipping meals as best as you can. Nausea can be more intense on an empty stomach. If you cannot tolerate food at that time, do not force it. Try sucking on ice chips or other frozen items, and make up for missed calories later. °· Lying down within 2 hours after eating. °· Environmental triggers. These may include smoky rooms, closed spaces, rooms with strong smells, warm or humid places, overly loud and noisy rooms, and rooms with motion or flickering lights. °· Quick and sudden changes in your movement. ° °This information is not intended to replace advice given to you by your health care provider. Make sure you discuss any questions you have with your health care provider. °Document Released: 08/11/2007 Document Revised: 06/12/2016 Document Reviewed: 05/14/2016 °Elsevier Interactive Patient Education © 2018 [employer] ° °

## 2018-05-21 NOTE — MAU Note (Signed)
Pt C/O ongoing vomiting, unable to hold down liquids.  Has mild upper abdominal pain, denies bleeding.

## 2018-05-25 ENCOUNTER — Encounter (HOSPITAL_COMMUNITY): Payer: Self-pay | Admitting: *Deleted

## 2018-05-25 ENCOUNTER — Inpatient Hospital Stay (HOSPITAL_COMMUNITY)
Admission: AD | Admit: 2018-05-25 | Discharge: 2018-05-26 | Disposition: A | Discharge status: Home / Self Care | Payer: Medicaid Other | Source: Ambulatory Visit | Attending: Obstetrics and Gynecology | Admitting: Obstetrics and Gynecology

## 2018-05-25 DIAGNOSIS — O210 Mild hyperemesis gravidarum: Secondary | ICD-10-CM | POA: Insufficient documentation

## 2018-05-25 DIAGNOSIS — Z87891 Personal history of nicotine dependence: Secondary | ICD-10-CM | POA: Diagnosis not present

## 2018-05-25 DIAGNOSIS — O219 Vomiting of pregnancy, unspecified: Secondary | ICD-10-CM

## 2018-05-25 DIAGNOSIS — Z3A11 11 weeks gestation of pregnancy: Secondary | ICD-10-CM | POA: Insufficient documentation

## 2018-05-25 LAB — COMPREHENSIVE METABOLIC PANEL
ALT: 12 U/L (ref 0–44)
AST: 32 U/L (ref 15–41)
Albumin: 3.7 g/dL (ref 3.5–5.0)
Alkaline Phosphatase: 66 U/L (ref 38–126)
Anion gap: 14 (ref 5–15)
BUN: 9 mg/dL (ref 6–20)
CO2: 18 mmol/L — ABNORMAL LOW (ref 22–32)
Calcium: 9 mg/dL (ref 8.9–10.3)
Chloride: 100 mmol/L (ref 98–111)
Creatinine, Ser: 0.68 mg/dL (ref 0.44–1.00)
GFR calc Af Amer: >60 mL/min (ref >60)
GFR calc non Af Amer: >60 mL/min (ref >60)
Glucose, Bld: 77 mg/dL (ref 70–99)
Potassium: 4.2 mmol/L (ref 3.5–5.1)
Sodium: 132 mmol/L — ABNORMAL LOW (ref 135–145)
Total Bilirubin: 2.7 mg/dL — ABNORMAL HIGH (ref 0.3–1.2)
Total Protein: 7.2 g/dL (ref 6.5–8.1)

## 2018-05-25 LAB — URINALYSIS, ROUTINE W REFLEX MICROSCOPIC
Glucose, UA: NEGATIVE mg/dL
Ketones, ur: 80 mg/dL — AB
Leukocytes, UA: NEGATIVE
Nitrite: NEGATIVE
Protein, ur: 100 mg/dL — AB
Specific Gravity, Urine: 1.03 (ref 1.005–1.030)
pH: 6 (ref 5.0–8.0)

## 2018-05-25 LAB — CBC
HCT: 39 % (ref 36.0–46.0)
Hemoglobin: 13.2 g/dL (ref 12.0–15.0)
MCH: 23.4 pg — ABNORMAL LOW (ref 26.0–34.0)
MCHC: 33.8 g/dL (ref 30.0–36.0)
MCV: 69 fL — ABNORMAL LOW (ref 78.0–100.0)
Platelets: 297 10*3/uL (ref 150–400)
RBC: 5.65 MIL/uL — ABNORMAL HIGH (ref 3.87–5.11)
RDW: 16.3 % — ABNORMAL HIGH (ref 11.5–15.5)
WBC: 7.2 10*3/uL (ref 4.0–10.5)

## 2018-05-25 MED ORDER — SODIUM CHLORIDE 0.9 % IV SOLN
8.0000 mg | Freq: Once | INTRAVENOUS | Status: AC
  Administered 2018-05-25: 8 mg via INTRAVENOUS
  Filled 2018-05-25: qty 4

## 2018-05-25 MED ORDER — LACTATED RINGERS IV BOLUS
1000.0000 mL | Freq: Once | INTRAVENOUS | Status: AC
  Administered 2018-05-25: 1000 mL via INTRAVENOUS

## 2018-05-25 MED ORDER — FAMOTIDINE IN NACL 20-0.9 MG/50ML-% IV SOLN
20.0000 mg | Freq: Once | INTRAVENOUS | Status: AC
  Administered 2018-05-26: 20 mg via INTRAVENOUS
  Filled 2018-05-25: qty 50

## 2018-05-25 MED ORDER — M.V.I. ADULT IV INJ
INJECTION | Freq: Once | INTRAVENOUS | Status: AC
  Administered 2018-05-25: via INTRAVENOUS
  Filled 2018-05-25: qty 10

## 2018-05-26 DIAGNOSIS — Z3A11 11 weeks gestation of pregnancy: Secondary | ICD-10-CM

## 2018-05-26 DIAGNOSIS — O219 Vomiting of pregnancy, unspecified: Secondary | ICD-10-CM

## 2018-05-26 MED ORDER — GLYCOPYRROLATE 1 MG PO TABS: 2.0000 mg | ORAL_TABLET | Freq: Three times a day (TID) | ORAL | 1 refills | Status: DC

## 2018-05-26 MED ORDER — PROMETHAZINE HCL 25 MG RE SUPP: 25.0000 mg | Freq: Four times a day (QID) | RECTAL | 2 refills | Status: DC | PRN

## 2018-05-26 MED ORDER — SCOPOLAMINE 1 MG/3DAYS TD PT72
MEDICATED_PATCH | TRANSDERMAL | Status: AC
  Filled 2018-05-26: qty 1

## 2018-05-26 MED ORDER — SCOPOLAMINE 1 MG/3DAYS TD PT72: 1.0000 | MEDICATED_PATCH | TRANSDERMAL | 0 refills | Status: DC

## 2018-05-26 MED ORDER — SCOPOLAMINE 1 MG/3DAYS TD PT72
1.0000 | MEDICATED_PATCH | TRANSDERMAL | Status: DC
  Administered 2018-05-26: 1.5 mg via TRANSDERMAL

## 2018-05-26 NOTE — Discharge Instructions (Signed)

## 2018-05-26 NOTE — MAU Note (Signed)
Pt presents to MAU via EMS for nausea and vomiting. Pt states her medications are not working and she is unable to keep food or drink down. Pt reports vaginal spotting which is not a new problem in this pregnancy pt has been evaluated for the bleeding. +FHT at 175. No LOF.

## 2018-05-26 NOTE — MAU Provider Note (Signed)
Chief Complaint: Emesis   First Provider Initiated Contact with Patient 05/25/18 2237      SUBJECTIVE HPI: Sara Walsh is a 32 y.o. [id number] at [redacted]w[redacted]d by LMP who presents to maternity admissions via EMS reporting nausea and vomiting.  She reports nausea and vomiting has been occurring for the past month.  She reports it has worsened over the past 2 weeks and medication that was prescribed is not working.  She states she is unable to keep anything down including water.  She reports vomiting 3-4 times over the past 24 hours.  She reports that vomiting is worse than nausea.  She is prescribed Robinul, Zantac, Reglan, Zofran, and Phenergan for nausea vomiting.  She reports continued vaginal spotting when she wipes that she has been seen in MAU for.  She denies having to wear a pad or panty liner for vaginal bleeding.  This pregnancy is complicated by subchorionic hematoma. She denies abdominal pain or cramping, vaginal itching/burning, urinary symptoms, h/a, dizziness, or fever/chills.     Past Medical History:  Diagnosis Date  . Chlamydia   . Fibroid   . Obesity   . Scabies   . Trichomonas infection    Past Surgical History:  Procedure Laterality Date  . CESAREAN SECTION     x3   Social History   Socioeconomic History  . Marital status: Single    Spouse name: Not on file  . Number of children: Not on file  . Years of education: Not on file  . Highest education level: Not on file  Occupational History  . Not on file  Social Needs  . Financial resource strain: Not on file  . Food insecurity:    Worry: Not on file    Inability: Not on file  . Transportation needs:    Medical: Not on file    Non-medical: Not on file  Tobacco Use  . Smoking status: Former Smoker    Types: Cigars, Cigarettes    Last attempt to quit: 09/09/2011    Years since quitting: 6.7  . Smokeless tobacco: Never Used  Substance and Sexual Activity  . Alcohol use: No  . Drug use: Not Currently   Types: Marijuana    Comment: weekends  . Sexual activity: Yes  Lifestyle  . Physical activity:    Days per week: Not on file    Minutes per session: Not on file  . Stress: Not on file  Relationships  . Social connections:    Talks on phone: Not on file    Gets together: Not on file    Attends religious service: Not on file    Active member of club or organization: Not on file    Attends meetings of clubs or organizations: Not on file    Relationship status: Not on file  . Intimate partner violence:    Fear of current or ex partner: Not on file    Emotionally abused: Not on file    Physically abused: Not on file    Forced sexual activity: Not on file  Other Topics Concern  . Not on file  Social History Narrative  . Not on file   No current facility-administered medications on file prior to encounter.    Current Outpatient Medications on File Prior to Encounter  Medication Sig Dispense Refill  . metoCLOPramide (REGLAN) 10 MG tablet Take 1 tablet (10 mg total) by mouth every 6 (six) hours. 30 tablet 0  . ondansetron (ZOFRAN ODT) 4 MG disintegrating tablet  Take 1 tablet (4 mg total) by mouth every 8 (eight) hours as needed for nausea or vomiting. 15 tablet 0  . promethazine (PHENERGAN) 25 MG tablet Take 1 tablet (25 mg total) by mouth every 6 (six) hours as needed for nausea or vomiting. 30 tablet 0  . ranitidine (ZANTAC) 150 MG tablet Take 1 tablet (150 mg total) by mouth 2 (two) times daily. 60 tablet 0   No Known Allergies  ROS:  Review of Systems  Constitutional: Negative.   Respiratory: Negative.   Cardiovascular: Negative.   Gastrointestinal: Positive for nausea and vomiting. Negative for abdominal pain, constipation and diarrhea.  Genitourinary: Positive for vaginal bleeding. Negative for difficulty urinating, dysuria, frequency, pelvic pain, urgency and vaginal discharge.   I have reviewed patient's Past Medical Hx, Surgical Hx, Family Hx, Social Hx, medications and  allergies.   Physical Exam   Vitals:   05/25/18 2100 05/26/18 0205  BP: 123/74 108/66  Pulse: 65 71  Resp: (!) 22 16  Temp: 99.1 F (37.3 C) 98.3 F (36.8 C)  TempSrc: Oral Oral  SpO2: 97%   Height: 5\' 4"  (1.626 m)    Constitutional: Well-developed, obese female in no acute distress.  Cardiovascular: normal rate Respiratory: normal effort GI: Abd soft, non-tender. Pos BS x 4 MS: Extremities nontender, no edema, normal ROM Neurologic: Alert and oriented x 4.  GU: Neg CVAT.  FHT 175 by doppler  LAB RESULTS Results for orders placed or performed during the hospital encounter of 05/25/18 (from the past 24 hour(s))  Urinalysis, Routine w reflex microscopic     Status: Abnormal   Collection Time: 05/25/18 10:28 PM  Result Value Ref Range   Color, Urine AMBER (A) YELLOW   APPearance HAZY (A) CLEAR   Specific Gravity, Urine 1.030 1.005 - 1.030   pH 6.0 5.0 - 8.0   Glucose, UA NEGATIVE NEGATIVE mg/dL   Hgb urine dipstick LARGE (A) NEGATIVE   Bilirubin Urine SMALL (A) NEGATIVE   Ketones, ur 80 (A) NEGATIVE mg/dL   Protein, ur 100 (A) NEGATIVE mg/dL   Nitrite NEGATIVE NEGATIVE   Leukocytes, UA NEGATIVE NEGATIVE   RBC / HPF 0-5 0 - 5 RBC/hpf   WBC, UA 0-5 0 - 5 WBC/hpf   Bacteria, UA RARE (A) NONE SEEN   Squamous Epithelial / LPF 11-20 0 - 5   Mucus PRESENT    Hyaline Casts, UA PRESENT   CBC     Status: Abnormal   Collection Time: 05/25/18 10:46 PM  Result Value Ref Range   WBC 7.2 4.0 - 10.5 K/uL   RBC 5.65 (H) 3.87 - 5.11 MIL/uL   Hemoglobin 13.2 12.0 - 15.0 g/dL   HCT 39.0 36.0 - 46.0 %   MCV 69.0 (L) 78.0 - 100.0 fL   MCH 23.4 (L) 26.0 - 34.0 pg   MCHC 33.8 30.0 - 36.0 g/dL   RDW 16.3 (H) 11.5 - 15.5 %   Platelets 297 150 - 400 K/uL  Comprehensive metabolic panel     Status: Abnormal   Collection Time: 05/25/18 10:46 PM  Result Value Ref Range   Sodium 132 (L) 135 - 145 mmol/L   Potassium 4.2 3.5 - 5.1 mmol/L   Chloride 100 98 - 111 mmol/L   CO2 18 (L) 22 -  32 mmol/L   Glucose, Bld 77 70 - 99 mg/dL   BUN 9 6 - 20 mg/dL   Creatinine, Ser 0.68 0.44 - 1.00 mg/dL   Calcium 9.0 8.9 - 10.3  mg/dL   Total Protein 7.2 6.5 - 8.1 g/dL   Albumin 3.7 3.5 - 5.0 g/dL   AST 32 15 - 41 U/L   ALT 12 0 - 44 U/L   Alkaline Phosphatase 66 38 - 126 U/L   Total Bilirubin 2.7 (H) 0.3 - 1.2 mg/dL   GFR calc non Af Amer >60 >60 mL/min   GFR calc Af Amer >60 >60 mL/min   Anion gap 14 5 - 15    --/--/O POS (07/17 0960)  MAU Management/MDM: Orders Placed This Encounter  Procedures  . Culture, OB Urine  . Urinalysis, Routine w reflex microscopic  . CBC  . Comprehensive metabolic panel  . Insert peripheral IV  . Discharge patient Discharge disposition: 01-Home or Self Care; Discharge patient date: 05/26/2018  Culture pending CBC and CMP within normal limits UA showed 80 ketones and 100 protein  Meds ordered this encounter  Medications  . lactated ringers bolus 1,000 mL  . famotidine (PEPCID) IVPB 20 mg premix  . ondansetron (ZOFRAN) 8 mg in sodium chloride 0.9 % 50 mL IVPB  . multivitamins adult (MVI -12) 10 mL in dextrose 5% lactated ringers 1,000 mL infusion  . scopolamine (TRANSDERM-SCOP) 1 MG/3DAYS 1.5 mg  . glycopyrrolate (ROBINUL) 1 MG tablet    Sig: Take 2 tablets (2 mg total) by mouth 3 (three) times daily.    Dispense:  90 tablet    Refill:  1    Order Specific Question:   Supervising Provider    Answer:   Aletha Halim [id number]  . scopolamine (TRANSDERM-SCOP) 1 MG/3DAYS    Sig: Place 1 patch (1.5 mg total) onto the skin every 3 (three) days.    Dispense:  10 patch    Refill:  0    Order Specific Question:   Supervising Provider    Answer:   Aletha Halim [id number]  . promethazine (PHENERGAN) 25 MG suppository    Sig: Place 1 suppository (25 mg total) rectally every 6 (six) hours as needed for nausea or vomiting.    Dispense:  12 each    Refill:  2    Order Specific Question:   Supervising Provider    Answer:   Aletha Halim  [[id number]]   Treatments in MAU included LR IV bolus, 20 mg Pepcid IV, 8 mg Zofran in NS, banana bag, and scope patch.  Patient able to tolerate liquids after treatment and MAU, patient refused to try crackers prior to discharge home.  Educated been discussed changes to medications with increasing Robinul to 2 mg 3 times daily, adding scope patch and Phenergan suppository. Discussed with patient use of suppository prior to taking other oral medications for nausea vomiting.  Patient agrees to plan of care.  Patient discharged.  Patient stable prior to discharge  ASSESSMENT 1. Nausea and vomiting in pregnancy   2. [redacted] weeks gestation of pregnancy     PLAN Discharge home Follow-up as scheduled for prenatal appointments Return to MAU as needed RX for Phenergan suppository, scope patch, and Robinul  Follow-up Information    [place]. Go on 05/28/2018.   Specialty:  Obstetrics and Gynecology Why:  Follow up as scheduled for prenatal appointment Thursday  Contact information: [place] [phone]          Allergies as of 05/26/2018   No Known Allergies     Medication List    STOP taking these medications   metoCLOPramide 10 MG tablet  Commonly known as:  REGLAN   ondansetron 4 MG disintegrating tablet Commonly known as:  ZOFRAN ODT   promethazine 25 MG tablet Commonly known as:  PHENERGAN Replaced by:  promethazine 25 MG suppository     TAKE these medications   glycopyrrolate 1 MG tablet Commonly known as:  ROBINUL Take 2 tablets (2 mg total) by mouth 3 (three) times daily. What changed:  how much to take   promethazine 25 MG suppository Commonly known as:  PHENERGAN Place 1 suppository (25 mg total) rectally every 6 (six) hours as needed for nausea or vomiting. Replaces:  promethazine 25 MG tablet   ranitidine 150 MG tablet Commonly known as:  ZANTAC Take 1 tablet (150 mg total) by mouth 2 (two)  times daily.   scopolamine 1 MG/3DAYS Commonly known as:  TRANSDERM-SCOP Place 1 patch (1.5 mg total) onto the skin every 3 (three) days.       Darrol Poke  Certified Nurse-Midwife 05/26/2018  1:56 AM

## 2018-05-27 LAB — CULTURE, OB URINE

## 2018-05-28 ENCOUNTER — Ambulatory Visit (INDEPENDENT_AMBULATORY_CARE_PROVIDER_SITE_OTHER): Payer: Medicaid Other | Admitting: Advanced Practice Midwife

## 2018-05-28 ENCOUNTER — Encounter: Payer: Self-pay | Admitting: Advanced Practice Midwife

## 2018-05-28 ENCOUNTER — Encounter: Payer: Self-pay | Admitting: General Practice

## 2018-05-28 ENCOUNTER — Other Ambulatory Visit (HOSPITAL_COMMUNITY)
Admission: RE | Admit: 2018-05-28 | Discharge: 2018-05-28 | Disposition: A | Discharge status: Home / Self Care | Payer: Medicaid Other | Source: Ambulatory Visit | Attending: Advanced Practice Midwife | Admitting: Advanced Practice Midwife

## 2018-05-28 VITALS — BP 151/110 | HR 103 | Wt 220.0 lb

## 2018-05-28 DIAGNOSIS — O10919 Unspecified pre-existing hypertension complicating pregnancy, unspecified trimester: Secondary | ICD-10-CM

## 2018-05-28 DIAGNOSIS — O0990 Supervision of high risk pregnancy, unspecified, unspecified trimester: Secondary | ICD-10-CM | POA: Insufficient documentation

## 2018-05-28 DIAGNOSIS — O0991 Supervision of high risk pregnancy, unspecified, first trimester: Secondary | ICD-10-CM

## 2018-05-28 DIAGNOSIS — Z98891 History of uterine scar from previous surgery: Secondary | ICD-10-CM | POA: Diagnosis not present

## 2018-05-28 DIAGNOSIS — O219 Vomiting of pregnancy, unspecified: Secondary | ICD-10-CM

## 2018-05-28 DIAGNOSIS — O10911 Unspecified pre-existing hypertension complicating pregnancy, first trimester: Secondary | ICD-10-CM | POA: Diagnosis not present

## 2018-05-28 DIAGNOSIS — Z3480 Encounter for supervision of other normal pregnancy, unspecified trimester: Secondary | ICD-10-CM

## 2018-05-28 MED ORDER — ONDANSETRON 8 MG PO TBDP: 8.0000 mg | ORAL_TABLET | Freq: Three times a day (TID) | ORAL | 6 refills | Status: DC | PRN

## 2018-05-28 MED ORDER — LABETALOL HCL 200 MG PO TABS: 200.0000 mg | ORAL_TABLET | Freq: Two times a day (BID) | ORAL | 3 refills | Status: DC

## 2018-05-28 MED ORDER — ASPIRIN EC 81 MG PO TBEC: 81.0000 mg | DELAYED_RELEASE_TABLET | Freq: Every day | ORAL | 11 refills | Status: DC

## 2018-05-28 MED ORDER — FAMOTIDINE 20 MG PO CHEW: 20.0000 mg | CHEWABLE_TABLET | Freq: Every day | ORAL | 11 refills | Status: DC

## 2018-05-28 NOTE — Patient Instructions (Addendum)
Plan for nausea and vomiting  Zofran (Ondansetron) 8mg  dissolve under tongue every 8 hours Promethazine (Phenergan) 25mg  suppository or tables, use rectally every 6 hours Famotidine (Pepcid) 20mg  chew once per day Glycopyrrolate (Robinul) 1mg  three times per day  Scopolamine patch change every 3 days   Very important to start: Labetalol 200mg  2 times per day This is for your blood pressure  Also starting on 06/07/18 start Aspirin 81 mg every day, this is to prevent complications from your blood pressure.

## 2018-05-28 NOTE — Progress Notes (Signed)
Subjective:   Sara Walsh is a 32 y.o. [id number] at 65w4dby LMP, early ultrasound being seen today for her first obstetrical visit.  Her obstetrical history is significant for obesity and CHTN, Previous c-section x 4 . Patient does intend to breast feed. Pregnancy history fully reviewed.  Patient states that aprrox 2 years ago she started a new job, and was told that she had hypertension. She was given medication, but never took it. She also was given medication during one of her MAU visits, but she states that she has not picked up that medication or started it at this time.   She reports "vomiting all day". She isn't taking anything for it at this time. She was seen in MAU and reports that they "changed all my meds and I didn't know what to do".   Patient reports heartburn, nausea and vomiting.  HISTORY: OB History  Gravida Para Term Preterm AB Living  '11 4 4 ' 0 6 4  SAB TAB Ectopic Multiple Live Births  0 3 0 0 4    # Outcome Date GA Lbr Len/2nd Weight Sex Delivery Anes PTL Lv  11 Current           10 Term 05/11/12 374w0d M CS-Unspec   LIV  9 TAB 03/23/09          8 Term 01/07/07 [id number] CS-Unspec   LIV  7 Term 07/24/05 37w3w0d CS-Unspec   LIV  6 Term 05/05/02 [id number]-Unspec   LIV  5 AB           4 AB           3 AB           2 TAB              Birth Comments: System Generated. Please review and update pregnancy details.  1 TAB             Last pap smear was done one or more years ago, unsure and was normal  Past Medical History:  Diagnosis Date  . Chlamydia   . Fibroid   . Obesity   . Scabies   . Thyroid disease   . Trichomonas infection    Past Surgical History:  Procedure Laterality Date  . CESAREAN SECTION     x3   Family History  Problem Relation Age of Onset  . Hypertension Father   . Anesthesia problems Neg Hx    Social History   Tobacco Use  . Smoking status: Former Smoker    Types: Cigars, Cigarettes    Last attempt to quit:  09/09/2011    Years since quitting: 6.7  . Smokeless tobacco: Never Used  Substance Use Topics  . Alcohol use: No  . Drug use: Not Currently    Types: Marijuana    Comment: weekends   No Known Allergies Current Outpatient Medications on File Prior to Visit  Medication Sig Dispense Refill  . glycopyrrolate (ROBINUL) 1 MG tablet Take 2 tablets (2 mg total) by mouth 3 (three) times daily. 90 tablet 1  . promethazine (PHENERGAN) 25 MG suppository Place 1 suppository (25 mg total) rectally every 6 (six) hours as needed for nausea or vomiting. 12 each 2  . ranitidine (ZANTAC) 150 MG tablet Take 1 tablet (150 mg total) by mouth 2 (two) times daily. 60 tablet 0  . scopolamine (TRANSDERM-SCOP) 1 MG/3DAYS Place 1 patch (1.5 mg total)  onto the skin every 3 (three) days. 10 patch 0   No current facility-administered medications on file prior to visit.     Review of Systems Pertinent items noted in HPI and remainder of comprehensive ROS otherwise negative.  Exam   Vitals:   05/28/18 1505  BP: (!) 151/110  Pulse: (!) 103  Weight: 220 lb (99.8 kg)      Uterus:     Pelvic Exam: Perineum: no hemorrhoids, normal perineum   Vulva: normal external genitalia, no lesions   Vagina:  normal mucosa, normal discharge   Cervix: no lesions and normal, pap smear done.    Adnexa: normal adnexa and no mass, fullness, tenderness   Bony Pelvis: average  System: General: well-developed, well-nourished female in no acute distress   Breast:  normal appearance, no masses or tenderness   Skin: normal coloration and turgor, no rashes   Neurologic: oriented, normal, negative, normal mood   Extremities: normal strength, tone, and muscle mass, ROM of all joints is normal   HEENT PERRLA, extraocular movement intact and sclera clear, anicteric   Mouth/Teeth mucous membranes moist, pharynx normal without lesions and dental hygiene good   Neck supple and no masses   Cardiovascular: regular rate and rhythm    Respiratory:  no respiratory distress, normal breath sounds   Abdomen: soft, non-tender; bowel sounds normal; no masses,  no organomegaly    Assessment:   Pregnancy: [id number] Patient Active Problem List   Diagnosis Date Noted  . Supervision of high risk pregnancy, antepartum 05/28/2018  . Chronic hypertension affecting pregnancy 05/18/2018  . Previous cesarean section 01/08/2018  . Previous abortion 01/08/2018  . Uterine fibroid 01/08/2018  . Nausea & vomiting 01/08/2018     Plan:  1. Supervision of high risk pregnancy, antepartum - Cystic Fibrosis Mutation 97 - Hemoglobinopathy evaluation - Obstetric Panel, Including HIV - SMN1 Copy Number Analysis - Cytology - PAP - HgB A1c - Culture, OB Urine - [place] MFM OB DETAIL +14 WK; Future  2. Chronic hypertension in pregnancy - baby ASA starting at 12 weeks  - Comp Met (CMET) - Protein / creatinine ratio, urine - [place] MFM OB DETAIL +14 WK; Future - Labetalol 200 mg BID   3. Previous cesarean section - X 4, plan repeat  4. Nausea/vomiting in pregnancy - Thyroid Panel With TSH - Increase Zofran ODT to 7m Q8 hours  - Pick up scopolamine patch rx and use - Pick up phenergan suppository and use on a scheduled basis, q 6 hours - Pepcid AC chew 265mQD, start this - Continue to use Robinul TID for spitting    Initial labs drawn. Continue prenatal vitamins. Genetic Screening discussed, NIPS: requested. Plan for panorama and AFP at next visit.  Ultrasound discussed; fetal anatomic survey: ordered. Problem list reviewed and updated. The nature of [facility] MDs and other Advanced Practice Providers was explained to patient; also emphasized that residents, students are part of our team. Routine obstetric precautions reviewed. 50% of 45 min visit spent in counseling and coordination of care. Return in about 1 month (around 06/25/2018) for High risk .

## 2018-05-30 LAB — URINE CULTURE, OB REFLEX

## 2018-05-30 LAB — CULTURE, OB URINE

## 2018-06-01 LAB — CYTOLOGY - PAP
Chlamydia: NEGATIVE
DIAGNOSIS: NEGATIVE
HPV (WINDOPATH): NOT DETECTED
Neisseria Gonorrhea: NEGATIVE

## 2018-06-08 ENCOUNTER — Inpatient Hospital Stay (HOSPITAL_COMMUNITY)
Admission: AD | Admit: 2018-06-08 | Discharge: 2018-06-08 | Disposition: A | Discharge status: Home / Self Care | Payer: Medicaid Other | Source: Ambulatory Visit | Attending: Obstetrics and Gynecology | Admitting: Obstetrics and Gynecology

## 2018-06-08 ENCOUNTER — Encounter (HOSPITAL_COMMUNITY): Payer: Self-pay | Admitting: *Deleted

## 2018-06-08 ENCOUNTER — Ambulatory Visit (INDEPENDENT_AMBULATORY_CARE_PROVIDER_SITE_OTHER): Payer: Medicaid Other | Admitting: Family Medicine

## 2018-06-08 VITALS — BP 125/87 | HR 82 | Wt 228.5 lb

## 2018-06-08 DIAGNOSIS — Z87891 Personal history of nicotine dependence: Secondary | ICD-10-CM | POA: Insufficient documentation

## 2018-06-08 DIAGNOSIS — Z3A12 12 weeks gestation of pregnancy: Secondary | ICD-10-CM | POA: Insufficient documentation

## 2018-06-08 DIAGNOSIS — O34219 Maternal care for unspecified type scar from previous cesarean delivery: Secondary | ICD-10-CM | POA: Diagnosis not present

## 2018-06-08 DIAGNOSIS — Z8249 Family history of ischemic heart disease and other diseases of the circulatory system: Secondary | ICD-10-CM | POA: Diagnosis not present

## 2018-06-08 DIAGNOSIS — O99211 Obesity complicating pregnancy, first trimester: Secondary | ICD-10-CM | POA: Diagnosis not present

## 2018-06-08 DIAGNOSIS — K5900 Constipation, unspecified: Secondary | ICD-10-CM | POA: Diagnosis present

## 2018-06-08 DIAGNOSIS — Z98891 History of uterine scar from previous surgery: Secondary | ICD-10-CM

## 2018-06-08 DIAGNOSIS — O99611 Diseases of the digestive system complicating pregnancy, first trimester: Secondary | ICD-10-CM | POA: Diagnosis not present

## 2018-06-08 DIAGNOSIS — O0990 Supervision of high risk pregnancy, unspecified, unspecified trimester: Secondary | ICD-10-CM

## 2018-06-08 DIAGNOSIS — O10919 Unspecified pre-existing hypertension complicating pregnancy, unspecified trimester: Secondary | ICD-10-CM

## 2018-06-08 DIAGNOSIS — E669 Obesity, unspecified: Secondary | ICD-10-CM | POA: Insufficient documentation

## 2018-06-08 DIAGNOSIS — R112 Nausea with vomiting, unspecified: Secondary | ICD-10-CM

## 2018-06-08 MED ORDER — FLEET ENEMA 7-19 GM/118ML RE ENEM: 1.0000 | ENEMA | Freq: Once | RECTAL | Status: DC

## 2018-06-08 MED ORDER — ASPIRIN EC 81 MG PO TBEC: 81.0000 mg | DELAYED_RELEASE_TABLET | Freq: Every day | ORAL | 11 refills | Status: DC

## 2018-06-08 MED ORDER — PREPLUS 27-1 MG PO TABS: 1.0000 | ORAL_TABLET | Freq: Every day | ORAL | 13 refills | Status: DC

## 2018-06-08 MED ORDER — POLYETHYLENE GLYCOL 3350 17 G PO PACK: 17.0000 g | PACK | Freq: Every day | ORAL | 0 refills | Status: DC

## 2018-06-08 NOTE — Discharge Instructions (Signed)

## 2018-06-08 NOTE — MAU Note (Addendum)
Pt C/O constipation for the past 2 weeks, taking stool softener, thinks it may be due to medications she is on.  Has not taken a laxative or enema, doesn't have the funds to buy this.  Pt states she was bleeding Saturday, no bleeding today.

## 2018-06-08 NOTE — Progress Notes (Signed)
Pt states has had some bleeding on & off since she found out she found out she was pregnant. Pt states stopped taking meds due to severe constipation.

## 2018-06-08 NOTE — MAU Provider Note (Signed)
History     CSN: [id number]  Arrival date and time: 06/08/18 1025   First Provider Initiated Contact with Patient 06/08/18 1006      Chief Complaint  Patient presents with  . Constipation   HPI  Ms.  Sara Walsh is a 32 y.o. year old [phone] female at [redacted]w[redacted]d weeks gestation who presents to MAU reporting she hasn't had a BM in 2 wks; only "little hard pebble like stool." She reports "lots" of pain in her rectum, "feels like it is sitting right there". She states she was unable to sleep d/t the rectal pain/pressure. She was taking Zofran, but stopped because of the constipation. She is taking Colace, but "it's not working." She called the after-hours RN and was told to get Miralax OTC. She states, "I don't have the money to get all of these medications."  Past Medical History:  Diagnosis Date  . Chlamydia   . Fibroid   . Obesity   . Scabies   . Thyroid disease   . Trichomonas infection     Past Surgical History:  Procedure Laterality Date  . CESAREAN SECTION     C/S x 4    Family History  Problem Relation Age of Onset  . Hypertension Father   . Anesthesia problems Neg Hx     Social History   Tobacco Use  . Smoking status: Former Smoker    Types: Cigars, Cigarettes    Last attempt to quit: 09/09/2011    Years since quitting: 6.7  . Smokeless tobacco: Never Used  Substance Use Topics  . Alcohol use: No  . Drug use: Not Currently    Types: Marijuana    Comment: weekends    Allergies: No Known Allergies  No medications prior to admission.    Review of Systems  Constitutional: Negative.   HENT: Negative.   Eyes: Negative.   Respiratory: Negative.   Cardiovascular: Negative.   Gastrointestinal: Positive for blood in stool and constipation.  Endocrine: Negative.   Genitourinary: Positive for vaginal bleeding (yesterday, but none today).  Musculoskeletal: Negative.   Skin: Negative.   Allergic/Immunologic: Negative.   Neurological: Negative.    Hematological: Negative.   Psychiatric/Behavioral: Negative.    Physical Exam   Blood pressure (!) 136/98, pulse (!) 117, temperature 98.2 F (36.8 C), temperature source Oral, resp. rate 18, weight 104.8 kg, last menstrual period 03/09/2018.  Physical Exam  Nursing note and vitals reviewed. Constitutional: She is oriented to person, place, and time. She appears well-developed and well-nourished.  HENT:  Head: Normocephalic and atraumatic.  Eyes: Pupils are equal, round, and reactive to light.  Neck: Normal range of motion.  Cardiovascular: Normal rate.  Respiratory: Effort normal and breath sounds normal.  GI: Soft. Bowel sounds are decreased.  Musculoskeletal: Normal range of motion.  Neurological: She is alert and oriented to person, place, and time.  Skin: Skin is warm and dry.  Psychiatric: She has a normal mood and affect. Her behavior is normal. Judgment and thought content normal.    MAU Course  Procedures  MDM Fleet Enema -- unable to hold long enough to pass signinficant amount of stool Soap Suds Enema -- successful passage of stool  Assessment and Plan  Constipation during pregnancy in first trimester - Plan: Discharge patient - Advised to stop taking Zofran   - Rx for Miralax sent - Advised to eat high fiber diet & drink lots of water - Keep scheduled HROB appt today - Discharge home - Patient verbalized  an understanding of the plan of care and agrees.   Laury Deep, MSN, CNM 06/08/2018, 10:07 AM

## 2018-06-08 NOTE — Progress Notes (Signed)
   PRENATAL VISIT NOTE  Subjective:  Sara Walsh is a 32 y.o. [id number] at [redacted]w[redacted]d being seen today for ongoing prenatal care.  She is currently monitored for the following issues for this high-risk pregnancy and has Previous cesarean section; Uterine fibroid; Nausea & vomiting; Chronic hypertension affecting pregnancy; Supervision of high risk pregnancy, antepartum; and Constipation during pregnancy on their problem list.  Patient reports no complaints.  Contractions: Not present. Vag. Bleeding: Scant.  Movement: Present. Denies leaking of fluid.   The following portions of the patient's history were reviewed and updated as appropriate: allergies, current medications, past family history, past medical history, past social history, past surgical history and problem list. Problem list updated.  Objective:   Vitals:   06/08/18 1357  BP: 125/87  Pulse: 82  Weight: 228 lb 8 oz (103.6 kg)    Fetal Status: Fetal Heart Rate (bpm): 166   Movement: Present     General:  Alert, oriented and cooperative. Patient is in no acute distress.  Skin: Skin is warm and dry. No rash noted.   Cardiovascular: Normal heart rate noted  Respiratory: Normal respiratory effort, no problems with respiration noted  Abdomen: Soft, gravid, appropriate for gestational age.  Pain/Pressure: Absent     Pelvic: Cervical exam deferred        Extremities: Normal range of motion.  Edema: None  Mental Status: Normal mood and affect. Normal behavior. Normal judgment and thought content.   Assessment and Plan:  Pregnancy: [id number] at [redacted]w[redacted]d  1. Supervision of high risk pregnancy, antepartum FHT normal - Genetic Screening  2. Chronic hypertension in pregnancy BP controlled. Not taking labetalol x 1 week. Will not continue with it for now Start ASA 81mg   3. Previous cesarean section x4 Will need repeat  4. Intractable vomiting with nausea, unspecified vomiting type controlled  Preterm labor symptoms and  general obstetric precautions including but not limited to vaginal bleeding, contractions, leaking of fluid and fetal movement were reviewed in detail with the patient. Please refer to After Visit Summary for other counseling recommendations.  Return in about 4 weeks (around 07/06/2018) for HR OB f/u.  Future Appointments  Date Time Provider Media  07/27/2018  1:00 PM WH-MFC [place] 3 WH-MFCUS MFC-US    Amilio Zehnder J Anil Havard, DO

## 2018-06-09 LAB — CYSTIC FIBROSIS MUTATION 97: Interpretation: NOT DETECTED

## 2018-06-09 LAB — OBSTETRIC PANEL, INCLUDING HIV
ANTIBODY SCREEN: NEGATIVE
BASOS ABS: 0 10*3/uL (ref 0.0–0.2)
BASOS: 1 %
EOS (ABSOLUTE): 0.1 10*3/uL (ref 0.0–0.4)
Eos: 1 %
HEMATOCRIT: 44.1 % (ref 34.0–46.6)
HEP B S AG: NEGATIVE
HIV Screen 4th Generation wRfx: NONREACTIVE
Hemoglobin: 14.1 g/dL (ref 11.1–15.9)
Immature Grans (Abs): 0 10*3/uL (ref 0.0–0.1)
Immature Granulocytes: 0 %
Lymphocytes Absolute: 1.7 10*3/uL (ref 0.7–3.1)
Lymphs: 24 %
MCH: 22.2 pg — ABNORMAL LOW (ref 26.6–33.0)
MCHC: 32 g/dL (ref 31.5–35.7)
MCV: 69 fL — AB (ref 79–97)
MONOCYTES: 10 %
Monocytes Absolute: 0.7 10*3/uL (ref 0.1–0.9)
NEUTROS ABS: 4.7 10*3/uL (ref 1.4–7.0)
Neutrophils: 64 %
Platelets: 355 10*3/uL (ref 150–450)
RBC: 6.36 x10E6/uL — ABNORMAL HIGH (ref 3.77–5.28)
RDW: 18.7 % — AB (ref 12.3–15.4)
RPR: NONREACTIVE
Rh Factor: POSITIVE
Rubella Antibodies, IGG: 2.68 index (ref >0.99)
WBC: 7.2 10*3/uL (ref 3.4–10.8)

## 2018-06-09 LAB — COMPREHENSIVE METABOLIC PANEL
A/G RATIO: 1.5 (ref 1.2–2.2)
ALK PHOS: 92 IU/L (ref 39–117)
ALT: 43 IU/L — ABNORMAL HIGH (ref 0–32)
AST: 27 IU/L (ref 0–40)
Albumin: 4.5 g/dL (ref 3.5–5.5)
BILIRUBIN TOTAL: 1.5 mg/dL — AB (ref 0.0–1.2)
BUN / CREAT RATIO: 8 — AB (ref 9–23)
BUN: 6 mg/dL (ref 6–20)
CO2: 18 mmol/L — ABNORMAL LOW (ref 20–29)
CREATININE: 0.73 mg/dL (ref 0.57–1.00)
Calcium: 9.7 mg/dL (ref 8.7–10.2)
Chloride: 96 mmol/L (ref 96–106)
GFR calc Af Amer: 124 mL/min/{1.73_m2} (ref >59)
GFR calc non Af Amer: 108 mL/min/{1.73_m2} (ref >59)
GLOBULIN, TOTAL: 3 g/dL (ref 1.5–4.5)
Glucose: 78 mg/dL (ref 65–99)
POTASSIUM: 3 mmol/L — AB (ref 3.5–5.2)
SODIUM: 137 mmol/L (ref 134–144)
Total Protein: 7.5 g/dL (ref 6.0–8.5)

## 2018-06-09 LAB — HEMOGLOBIN A1C
ESTIMATED AVERAGE GLUCOSE: 88 mg/dL
HEMOGLOBIN A1C: 4.7 % — AB (ref 4.8–5.6)

## 2018-06-09 LAB — THYROID PANEL WITH TSH
Free Thyroxine Index: 2.9 (ref 1.2–4.9)
T3 Uptake Ratio: 24 % (ref 24–39)
T4, Total: 12.2 ug/dL — ABNORMAL HIGH (ref 4.5–12.0)
TSH: 0.261 u[IU]/mL — ABNORMAL LOW (ref 0.450–4.500)

## 2018-06-09 LAB — HEMOGLOBINOPATHY EVALUATION
HGB C: 0 %
HGB S: 0 %
HGB VARIANT: 0 %
Hemoglobin A2 Quantitation: 5.9 % — ABNORMAL HIGH (ref 1.8–3.2)
Hemoglobin F Quantitation: 4.3 % — ABNORMAL HIGH (ref 0.0–2.0)
Hgb A: 89.8 % — ABNORMAL LOW (ref 96.4–98.8)

## 2018-06-09 LAB — SMN1 COPY NUMBER ANALYSIS (SMA CARRIER SCREENING)

## 2018-06-09 LAB — PROTEIN / CREATININE RATIO, URINE
CREATININE, UR: 281.2 mg/dL
Protein, Ur: 67.8 mg/dL
Protein/Creat Ratio: 241 mg/g creat — ABNORMAL HIGH (ref 0–200)

## 2018-06-14 ENCOUNTER — Inpatient Hospital Stay (HOSPITAL_COMMUNITY)
Admission: AD | Admit: 2018-06-14 | Discharge: 2018-06-14 | Disposition: A | Discharge status: Home / Self Care | Payer: Medicaid Other | Source: Ambulatory Visit | Attending: Obstetrics and Gynecology | Admitting: Obstetrics and Gynecology

## 2018-06-14 ENCOUNTER — Inpatient Hospital Stay (HOSPITAL_COMMUNITY): Payer: Medicaid Other

## 2018-06-14 ENCOUNTER — Encounter (HOSPITAL_COMMUNITY): Payer: Self-pay

## 2018-06-14 DIAGNOSIS — Z3A13 13 weeks gestation of pregnancy: Secondary | ICD-10-CM | POA: Diagnosis not present

## 2018-06-14 DIAGNOSIS — O418X20 Other specified disorders of amniotic fluid and membranes, second trimester, not applicable or unspecified: Secondary | ICD-10-CM | POA: Diagnosis not present

## 2018-06-14 DIAGNOSIS — O4692 Antepartum hemorrhage, unspecified, second trimester: Secondary | ICD-10-CM

## 2018-06-14 DIAGNOSIS — Z87891 Personal history of nicotine dependence: Secondary | ICD-10-CM | POA: Insufficient documentation

## 2018-06-14 DIAGNOSIS — O468X2 Other antepartum hemorrhage, second trimester: Secondary | ICD-10-CM

## 2018-06-14 DIAGNOSIS — O209 Hemorrhage in early pregnancy, unspecified: Secondary | ICD-10-CM | POA: Diagnosis present

## 2018-06-14 DIAGNOSIS — O208 Other hemorrhage in early pregnancy: Secondary | ICD-10-CM | POA: Insufficient documentation

## 2018-06-14 MED ORDER — ACETAMINOPHEN 500 MG PO TABS
1000.0000 mg | ORAL_TABLET | Freq: Once | ORAL | Status: AC
  Administered 2018-06-14: 1000 mg via ORAL
  Filled 2018-06-14: qty 2

## 2018-06-14 NOTE — MAU Provider Note (Signed)
History     CSN: [id number]  Arrival date and time: 06/14/18 5366   First Provider Initiated Contact with Patient 06/14/18 2040      Chief Complaint  Patient presents with  . Vaginal Bleeding   HPI  Ms.  Sara Walsh is a 32 y.o. year old [phone] female at [redacted]w[redacted]d weeks gestation who presents to MAU reporting VB that filled 2 pads in 1 hour at around 1130. She reports the bleeding was dark red, but has "lightened up to spotting." She reports she has had bleeding in the pregnancy before and was dx'd with a [facility]. She also reports some lower abdominal pressure, but "not abdominal pain." She denies any blood clots. She reports that every time she takes her ASA it "messes her stomach up" then gives her a H/A; which she is having now. She has not taken any Tylenol for her H/A today.  Past Medical History:  Diagnosis Date  . Chlamydia   . Fibroid   . Obesity   . Scabies   . Thyroid disease   . Trichomonas infection     Past Surgical History:  Procedure Laterality Date  . CESAREAN SECTION     C/S x 4    Family History  Problem Relation Age of Onset  . Hypertension Father   . Anesthesia problems Neg Hx     Social History   Tobacco Use  . Smoking status: Former Smoker    Types: Cigars, Cigarettes    Last attempt to quit: 09/09/2011    Years since quitting: 6.7  . Smokeless tobacco: Never Used  Substance Use Topics  . Alcohol use: No  . Drug use: Not Currently    Types: Marijuana    Comment: weekends    Allergies: No Known Allergies  Medications Prior to Admission  Medication Sig Dispense Refill Last Dose  . aspirin EC 81 MG tablet Take 1 tablet (81 mg total) by mouth daily. Start taking at [redacted] weeks gestation 30 tablet 11   . polyethylene glycol (MIRALAX / GLYCOLAX) packet Take 17 g by mouth daily. 14 each 0 Taking  . Prenatal Vit-Fe Fumarate-FA (PREPLUS) 27-1 MG TABS Take 1 tablet by mouth daily. 30 tablet 13   . promethazine (PHENERGAN) 25 MG suppository Place  1 suppository (25 mg total) rectally every 6 (six) hours as needed for nausea or vomiting. 12 each 2 Taking    Review of Systems  Constitutional: Negative.   HENT: Negative.   Eyes: Negative.   Respiratory: Negative.   Cardiovascular: Negative.   Gastrointestinal: Positive for abdominal pain ("upset stomach after taking ASA everyday").  Endocrine: Negative.   Genitourinary: Positive for vaginal bleeding ("lots of bleeding earlier; starting to slow down some now").  Musculoskeletal: Negative.   Skin: Negative.   Allergic/Immunologic: Negative.   Neurological: Positive for headaches.  Hematological: Negative.   Psychiatric/Behavioral: Negative.    Physical Exam   Blood pressure 137/63, pulse 99, temperature 98.5 F (36.9 C), resp. rate 16, height 5\' 4"  (1.626 m), weight 105.6 kg, last menstrual period 03/09/2018, SpO2 98 %.  Physical Exam  Nursing note and vitals reviewed. Constitutional: She is oriented to person, place, and time. She appears well-developed and well-nourished.  HENT:  Head: Normocephalic and atraumatic.  Eyes: Pupils are equal, round, and reactive to light.  Neck: Normal range of motion.  Cardiovascular: Normal rate.  Respiratory: Effort normal.  GI: Soft.  Genitourinary:  Genitourinary Comments: Uterus: gravid, S=D, SE: cervix is smooth, pink, no lesions, moderate  amt of dark, red blood clot in vaginal vault, closed/long/firm, no CMT or friability, no adnexal tenderness   Musculoskeletal: Normal range of motion.  Neurological: She is alert and oriented to person, place, and time. She has normal reflexes.  Skin: Skin is warm and dry.  Psychiatric: She has a normal mood and affect. Her behavior is normal. Judgment and thought content normal.    MAU Course  Procedures  MDM Pelvic Exam F/U U/S <14 wks  Tylenol 1000 mg -- resolved  US Ob Comp Less 14 Wks  Result Date: 06/14/2018 CLINICAL DATA:  Bleeding, fibroids EXAM: OBSTETRIC <14 WK [place] AND  TRANSVAGINAL OB US TECHNIQUE: Both transabdominal and transvaginal ultrasound examinations were performed for complete evaluation of the gestation as well as the maternal uterus, adnexal regions, and pelvic cul-de-sac. Transvaginal technique was performed to assess early pregnancy. COMPARISON:  05/18/2018, 05/13/2018 FINDINGS: Intrauterine gestational sac: Single intrauterine gestation Yolk sac:  Not seen Embryo:  Visible Cardiac Activity: Visible Heart Rate: 158 bpm CRL: 65.9 mm   12 w   6 d                  [place] EDC: 12/21/2018 Subchorionic hemorrhage: Small subchorionic hemorrhage inferiorly measuring 2.5 x 1.1 x 1.1 cm, previously 0.9 cm. The second small hemorrhage noted on 05/18/2018 is not seen today. Maternal uterus/adnexae: Ovaries are within normal limits. The right ovary measures 2 by 4.7 by 2.3 cm. The left ovary measures 2.3 by 3.4 x 1.7 cm. Fibroids in the uterus. Posterior fibroid measures 8.7 x 6.3 x 7.5 cm, previously 7.1 x 5.5 x 6.8 cm. No significant free fluid IMPRESSION: 1. Single viable intrauterine gestation as above. 2. Resolution of 1 of the previously noted small subchorionic hemorrhages. Slight increased size of small inferior subchorionic hemorrhage as compared with 05/18/2018. 3. Large posterior uterine fibroid measuring up to 8.7 cm. Electronically Signed   By: Donavan Foil M.D.   On: 06/14/2018 21:34      Assessment and Plan  Subchorionic hematoma in second trimester, single or unspecified fetus  - Bleeding precautions reviewed  - Information provided on [facility]  - Discharge patient - Keep OB appt on 07/06/18 at [facility] - Patient verbalized an understanding of the plan of care and agrees.     Laury Deep, MSN, CNM 06/14/2018, 10:14 PM

## 2018-06-14 NOTE — MAU Note (Signed)
Started having vaginal bleeding around 1130-filled >2 pads within an hour.  Dark red at first but has since lightened up.  Now just having spotting.  No clots.  Has had bleeding in the pregnancy already-diagnosed with [facility].  Also having some pressure but no "abnormal pain."

## 2018-06-16 ENCOUNTER — Encounter: Payer: Self-pay | Admitting: *Deleted

## 2018-06-21 ENCOUNTER — Encounter (HOSPITAL_COMMUNITY): Payer: Self-pay | Admitting: *Deleted

## 2018-06-21 ENCOUNTER — Inpatient Hospital Stay (HOSPITAL_COMMUNITY)
Admission: AD | Admit: 2018-06-21 | Discharge: 2018-06-21 | Disposition: A | Discharge status: Home / Self Care | Payer: Medicaid Other | Source: Ambulatory Visit | Attending: Obstetrics & Gynecology | Admitting: Obstetrics & Gynecology

## 2018-06-21 DIAGNOSIS — R102 Pelvic and perineal pain: Secondary | ICD-10-CM | POA: Diagnosis not present

## 2018-06-21 DIAGNOSIS — E860 Dehydration: Secondary | ICD-10-CM | POA: Diagnosis present

## 2018-06-21 DIAGNOSIS — Z3A14 14 weeks gestation of pregnancy: Secondary | ICD-10-CM | POA: Insufficient documentation

## 2018-06-21 DIAGNOSIS — O99282 Endocrine, nutritional and metabolic diseases complicating pregnancy, second trimester: Secondary | ICD-10-CM | POA: Insufficient documentation

## 2018-06-21 DIAGNOSIS — Z79899 Other long term (current) drug therapy: Secondary | ICD-10-CM | POA: Diagnosis not present

## 2018-06-21 DIAGNOSIS — O26892 Other specified pregnancy related conditions, second trimester: Secondary | ICD-10-CM | POA: Diagnosis not present

## 2018-06-21 DIAGNOSIS — Z7982 Long term (current) use of aspirin: Secondary | ICD-10-CM | POA: Insufficient documentation

## 2018-06-21 DIAGNOSIS — Z6790 Unspecified blood type, Rh positive: Secondary | ICD-10-CM

## 2018-06-21 DIAGNOSIS — O26899 Other specified pregnancy related conditions, unspecified trimester: Secondary | ICD-10-CM

## 2018-06-21 HISTORY — DX: Essential (primary) hypertension: I10

## 2018-06-21 LAB — CBC
HCT: 37.8 % (ref 36.0–46.0)
Hemoglobin: 12.4 g/dL (ref 12.0–15.0)
MCH: 23.4 pg — ABNORMAL LOW (ref 26.0–34.0)
MCHC: 32.8 g/dL (ref 30.0–36.0)
MCV: 71.2 fL — ABNORMAL LOW (ref 78.0–100.0)
PLATELETS: 269 10*3/uL (ref 150–400)
RBC: 5.31 MIL/uL — ABNORMAL HIGH (ref 3.87–5.11)
RDW: 17.1 % — AB (ref 11.5–15.5)
WBC: 10.7 10*3/uL — AB (ref 4.0–10.5)

## 2018-06-21 LAB — URINALYSIS, ROUTINE W REFLEX MICROSCOPIC
BILIRUBIN URINE: NEGATIVE
Glucose, UA: NEGATIVE mg/dL
KETONES UR: 20 mg/dL — AB
LEUKOCYTES UA: NEGATIVE
NITRITE: NEGATIVE
PROTEIN: 30 mg/dL — AB
Specific Gravity, Urine: 1.023 (ref 1.005–1.030)
pH: 5 (ref 5.0–8.0)

## 2018-06-21 LAB — WET PREP, GENITAL
Sperm: NONE SEEN
TRICH WET PREP: NONE SEEN
YEAST WET PREP: NONE SEEN

## 2018-06-21 MED ORDER — ACETAMINOPHEN 500 MG PO TABS
1000.0000 mg | ORAL_TABLET | Freq: Four times a day (QID) | ORAL | Status: DC | PRN
  Administered 2018-06-21: 1000 mg via ORAL
  Filled 2018-06-21: qty 2

## 2018-06-21 NOTE — MAU Note (Signed)
Cramping since yesterday, 9/10, lower abdomen, intermittent, radiates to back   Some spotting  No vaginal discharge

## 2018-06-21 NOTE — Discharge Instructions (Signed)

## 2018-06-21 NOTE — MAU Provider Note (Addendum)
History     CSN: [id number]  Arrival date and time: 06/21/18 1539   First Provider Initiated Contact with Patient 06/21/18 1617      Chief Complaint  Patient presents with  . Abdominal Pain  . Back Pain  . Vaginal Bleeding   [id number] @14 .0 wks here with abd cramping and spotting. Sx started yesterday. Cramping fells like menstrual cramps but worse. Had some dysuria yesterday but not today. No other urinary sx. She is seeing pink-red blood when she wipes. She has not taken anything for it. No fevers. No   OB History    Gravida  11   Para  4   Term  4   Preterm  0   AB  6   Living  4     SAB  1   TAB  5   Ectopic  0   Multiple  0   Live Births  4           Past Medical History:  Diagnosis Date  . Chlamydia   . Fibroid   . Hypertension   . Obesity   . Scabies   . Thyroid disease   . Trichomonas infection     Past Surgical History:  Procedure Laterality Date  . CESAREAN SECTION     C/S x 4  . WISDOM TOOTH EXTRACTION      Family History  Problem Relation Age of Onset  . Hypertension Father   . Anesthesia problems Neg Hx     Social History   Tobacco Use  . Smoking status: Former Smoker    Types: Cigars, Cigarettes    Last attempt to quit: 09/09/2011    Years since quitting: 6.7  . Smokeless tobacco: Never Used  Substance Use Topics  . Alcohol use: No  . Drug use: Not Currently    Types: Marijuana    Comment: weekends    Allergies: No Known Allergies  Medications Prior to Admission  Medication Sig Dispense Refill Last Dose  . aspirin EC 81 MG tablet Take 1 tablet (81 mg total) by mouth daily. Start taking at [redacted] weeks gestation 30 tablet 11   . polyethylene glycol (MIRALAX / GLYCOLAX) packet Take 17 g by mouth daily. 14 each 0 Taking  . Prenatal Vit-Fe Fumarate-FA (PREPLUS) 27-1 MG TABS Take 1 tablet by mouth daily. 30 tablet 13   . promethazine (PHENERGAN) 25 MG suppository Place 1 suppository (25 mg total) rectally every 6 (six)  hours as needed for nausea or vomiting. 12 each 2 Taking    Review of Systems  Constitutional: Negative for fever.  Gastrointestinal: Positive for abdominal pain. Negative for constipation, diarrhea, nausea and vomiting.  Genitourinary: Positive for dysuria and vaginal bleeding. Negative for hematuria, urgency and vaginal discharge.  Musculoskeletal: Negative for back pain.   Physical Exam   Blood pressure (!) 147/94, pulse (!) 102, temperature 98.9 F (37.2 C), temperature source Oral, resp. rate 18, weight 104.8 kg, last menstrual period 03/09/2018, unknown if currently breastfeeding.  Physical Exam  Constitutional: She is oriented to person, place, and time. She appears well-developed and well-nourished. No distress.  HENT:  Head: Normocephalic and atraumatic.  Neck: Normal range of motion.  Respiratory: Effort normal. No respiratory distress.  GI: Soft. She exhibits no distension and no mass. There is no tenderness. There is no rebound and no guarding.  Genitourinary:  Genitourinary Comments: External: no lesions or erythema Vagina: rugated, pink, moist, small drk red bloody discharge, cleared with 1 fox swab,  no active bleeding Cervix closed/long   Musculoskeletal: Normal range of motion.  Neurological: She is alert and oriented to person, place, and time.  Skin: Skin is warm and dry.  Psychiatric: She has a normal mood and affect.   Results for orders placed or performed during the hospital encounter of 06/21/18 (from the past 24 hour(s))  Urinalysis, Routine w reflex microscopic     Status: Abnormal   Collection Time: 06/21/18  3:44 PM  Result Value Ref Range   Color, Urine YELLOW YELLOW   APPearance HAZY (A) CLEAR   Specific Gravity, Urine 1.023 1.005 - 1.030   pH 5.0 5.0 - 8.0   Glucose, UA NEGATIVE NEGATIVE mg/dL   Hgb urine dipstick LARGE (A) NEGATIVE   Bilirubin Urine NEGATIVE NEGATIVE   Ketones, ur 20 (A) NEGATIVE mg/dL   Protein, ur 30 (A) NEGATIVE mg/dL    Nitrite NEGATIVE NEGATIVE   Leukocytes, UA NEGATIVE NEGATIVE   RBC / HPF 0-5 0 - 5 RBC/hpf   WBC, UA 0-5 0 - 5 WBC/hpf   Bacteria, UA RARE (A) NONE SEEN   Squamous Epithelial / LPF 6-10 0 - 5   Mucus PRESENT   Wet prep, genital     Status: Abnormal   Collection Time: 06/21/18  4:28 PM  Result Value Ref Range   Yeast Wet Prep HPF POC NONE SEEN NONE SEEN   Trich, Wet Prep NONE SEEN NONE SEEN   Clue Cells Wet Prep HPF POC PRESENT (A) NONE SEEN   WBC, Wet Prep HPF POC FEW (A) NONE SEEN   Sperm NONE SEEN   CBC     Status: Abnormal   Collection Time: 06/21/18  5:08 PM  Result Value Ref Range   WBC 10.7 (H) 4.0 - 10.5 K/uL   RBC 5.31 (H) 3.87 - 5.11 MIL/uL   Hemoglobin 12.4 12.0 - 15.0 g/dL   HCT 37.8 36.0 - 46.0 %   MCV 71.2 (L) 78.0 - 100.0 fL   MCH 23.4 (L) 26.0 - 34.0 pg   MCHC 32.8 30.0 - 36.0 g/dL   RDW 17.1 (H) 11.5 - 15.5 %   Platelets 269 150 - 400 K/uL   Limited bedside US: viable, active fetus, +cardiac activity, subj. nml AFV  MAU Course  Procedures Tylenol  MDM Labs ordered and reviewed. Pain improved after Tylenol. Likely caused by dehydration and/or RL. Discussed comfort measures. Spotting may be caused by [facility] (seen on last [place]). BP elevated today, she's been off Labetalol for about 3 weeks because her BP was good and she was instructed not to take it. HTN today may be d/t pain, recommend BP check in 2 days and if still elevated would need to resume Labetalol. Stable for discharge home.   Assessment and Plan   1. [redacted] weeks gestation of pregnancy   2. Dehydration   3. Pain of round ligament during pregnancy   4. Blood type, Rh positive    Discharge home Follow up in OB office this week SAB/return precautions Tylenol/heat prn Increase hydration  Allergies as of 06/21/2018   No Known Allergies     Medication List    TAKE these medications   aspirin EC 81 MG tablet Take 1 tablet (81 mg total) by mouth daily. Start taking at [redacted] weeks gestation    polyethylene glycol packet Commonly known as:  MIRALAX / GLYCOLAX Take 17 g by mouth daily.   PREPLUS 27-1 MG Tabs Take 1 tablet by mouth daily.   promethazine 25  MG suppository Commonly known as:  PHENERGAN Place 1 suppository (25 mg total) rectally every 6 (six) hours as needed for nausea or vomiting.       Julianne Handler, CNM 06/21/2018, 5:43 PM

## 2018-06-23 ENCOUNTER — Ambulatory Visit: Payer: Medicaid Other

## 2018-07-03 ENCOUNTER — Encounter: Payer: Self-pay | Admitting: *Deleted

## 2018-07-06 ENCOUNTER — Encounter: Payer: Self-pay | Admitting: *Deleted

## 2018-07-06 ENCOUNTER — Ambulatory Visit (INDEPENDENT_AMBULATORY_CARE_PROVIDER_SITE_OTHER): Payer: Medicaid Other | Admitting: Family Medicine

## 2018-07-06 VITALS — BP 127/79 | HR 73 | Wt 233.8 lb

## 2018-07-06 DIAGNOSIS — Z98891 History of uterine scar from previous surgery: Secondary | ICD-10-CM

## 2018-07-06 DIAGNOSIS — O10912 Unspecified pre-existing hypertension complicating pregnancy, second trimester: Secondary | ICD-10-CM

## 2018-07-06 DIAGNOSIS — O10919 Unspecified pre-existing hypertension complicating pregnancy, unspecified trimester: Secondary | ICD-10-CM

## 2018-07-06 DIAGNOSIS — O0992 Supervision of high risk pregnancy, unspecified, second trimester: Secondary | ICD-10-CM

## 2018-07-06 DIAGNOSIS — O0990 Supervision of high risk pregnancy, unspecified, unspecified trimester: Secondary | ICD-10-CM

## 2018-07-06 NOTE — Progress Notes (Signed)
   PRENATAL VISIT NOTE  Subjective:  Sara Walsh is a 32 y.o. [id number] at [redacted]w[redacted]d being seen today for ongoing prenatal care.  She is currently monitored for the following issues for this high-risk pregnancy and has Previous cesarean section; Uterine fibroid; Nausea & vomiting; Chronic hypertension affecting pregnancy; Supervision of high risk pregnancy, antepartum; Constipation during pregnancy; and Subchorionic hematoma in second trimester on their problem list.  Patient reports had an episode of lightheadedness, feeling like she was going to pass out..  Contractions: Not present. Vag. Bleeding: None.  Movement: Present. Denies leaking of fluid.   The following portions of the patient's history were reviewed and updated as appropriate: allergies, current medications, past family history, past medical history, past social history, past surgical history and problem list. Problem list updated.  Objective:   Vitals:   07/06/18 1546  BP: 127/79  Pulse: 73  Weight: 233 lb 12.8 oz (106.1 kg)    Fetal Status: Fetal Heart Rate (bpm): 147   Movement: Present     General:  Alert, oriented and cooperative. Patient is in no acute distress.  Skin: Skin is warm and dry. No rash noted.   Cardiovascular: Normal heart rate noted  Respiratory: Normal respiratory effort, no problems with respiration noted  Abdomen: Soft, gravid, appropriate for gestational age.  Pain/Pressure: Present     Pelvic: Cervical exam deferred        Extremities: Normal range of motion.  Edema: None  Mental Status: Normal mood and affect. Normal behavior. Normal judgment and thought content.   Assessment and Plan:  Pregnancy: [id number] at [redacted]w[redacted]d  1. Supervision of high risk pregnancy, antepartum FHT and FH normal. Pt to notify if she has further episodes. - AFP, Serum, Open Spina Bifida  2. Chronic hypertension in pregnancy BP controlled  3. Previous cesarean section   Preterm labor symptoms and general obstetric  precautions including but not limited to vaginal bleeding, contractions, leaking of fluid and fetal movement were reviewed in detail with the patient. Please refer to After Visit Summary for other counseling recommendations.  Return in about 4 weeks (around 08/03/2018).  Future Appointments  Date Time Provider [place]  07/27/2018  1:00 PM WH-MFC [place] 3 WH-MFCUS MFC-US  08/05/2018 10:55 AM Aletha Halim, MD [place], DO

## 2018-07-06 NOTE — Addendum Note (Signed)
Addended by: Dolores Hoose on: 07/06/2018 05:03 PM   Modules accepted: Miquel Dunn

## 2018-07-08 LAB — AFP, SERUM, OPEN SPINA BIFIDA
AFP MoM: 3.02
AFP VALUE AFPOSL: 85.5 ng/mL
Gest. Age on Collection Date: 16.1 weeks
Maternal Age At EDD: 34.8 yr
OSBR Risk 1 IN: 227
Test Results:: POSITIVE — AB
Weight: 224 [lb_av]

## 2018-07-09 ENCOUNTER — Telehealth: Payer: Self-pay | Admitting: General Practice

## 2018-07-09 NOTE — Telephone Encounter (Signed)
Patient called and left message on nurse voicemail line stating she is returning Dr Glenna Durand call.

## 2018-07-10 ENCOUNTER — Telehealth: Payer: Self-pay | Admitting: Family Medicine

## 2018-07-10 NOTE — Telephone Encounter (Signed)
Patient stated she was called by Dr Nehemiah Settle. She needs to speak with him about a test she took. Requesting a call back from his nurse.

## 2018-07-13 NOTE — Telephone Encounter (Signed)
Called pt and pt informed me that her concerns are addressed.  Pt did not have any other questions.

## 2018-07-20 ENCOUNTER — Encounter: Payer: Self-pay | Admitting: *Deleted

## 2018-07-20 ENCOUNTER — Encounter (HOSPITAL_COMMUNITY): Payer: Self-pay

## 2018-07-27 ENCOUNTER — Ambulatory Visit (HOSPITAL_COMMUNITY)
Admission: RE | Admit: 2018-07-27 | Discharge: 2018-07-27 | Disposition: A | Discharge status: Home / Self Care | Payer: Medicaid Other | Source: Ambulatory Visit | Attending: Advanced Practice Midwife | Admitting: Advanced Practice Midwife

## 2018-07-27 ENCOUNTER — Encounter (HOSPITAL_COMMUNITY): Payer: Self-pay

## 2018-07-27 ENCOUNTER — Ambulatory Visit (INDEPENDENT_AMBULATORY_CARE_PROVIDER_SITE_OTHER): Payer: Medicaid Other | Admitting: Family Medicine

## 2018-07-27 ENCOUNTER — Other Ambulatory Visit (HOSPITAL_COMMUNITY): Payer: Self-pay | Admitting: *Deleted

## 2018-07-27 ENCOUNTER — Encounter: Payer: Self-pay | Admitting: Family Medicine

## 2018-07-27 VITALS — BP 131/83 | HR 82 | Wt 241.9 lb

## 2018-07-27 DIAGNOSIS — Z361 Encounter for antenatal screening for raised alphafetoprotein level: Secondary | ICD-10-CM | POA: Insufficient documentation

## 2018-07-27 DIAGNOSIS — D259 Leiomyoma of uterus, unspecified: Secondary | ICD-10-CM | POA: Diagnosis not present

## 2018-07-27 DIAGNOSIS — O3412 Maternal care for benign tumor of corpus uteri, second trimester: Secondary | ICD-10-CM | POA: Diagnosis not present

## 2018-07-27 DIAGNOSIS — Z98891 History of uterine scar from previous surgery: Secondary | ICD-10-CM

## 2018-07-27 DIAGNOSIS — O10012 Pre-existing essential hypertension complicating pregnancy, second trimester: Secondary | ICD-10-CM | POA: Insufficient documentation

## 2018-07-27 DIAGNOSIS — Z3A18 18 weeks gestation of pregnancy: Secondary | ICD-10-CM | POA: Diagnosis not present

## 2018-07-27 DIAGNOSIS — O99212 Obesity complicating pregnancy, second trimester: Secondary | ICD-10-CM | POA: Diagnosis not present

## 2018-07-27 DIAGNOSIS — O0990 Supervision of high risk pregnancy, unspecified, unspecified trimester: Secondary | ICD-10-CM

## 2018-07-27 DIAGNOSIS — Z362 Encounter for other antenatal screening follow-up: Secondary | ICD-10-CM

## 2018-07-27 DIAGNOSIS — O34219 Maternal care for unspecified type scar from previous cesarean delivery: Secondary | ICD-10-CM | POA: Diagnosis not present

## 2018-07-27 DIAGNOSIS — O418X20 Other specified disorders of amniotic fluid and membranes, second trimester, not applicable or unspecified: Secondary | ICD-10-CM

## 2018-07-27 DIAGNOSIS — O10919 Unspecified pre-existing hypertension complicating pregnancy, unspecified trimester: Secondary | ICD-10-CM

## 2018-07-27 DIAGNOSIS — K5900 Constipation, unspecified: Secondary | ICD-10-CM

## 2018-07-27 DIAGNOSIS — Z363 Encounter for antenatal screening for malformations: Secondary | ICD-10-CM

## 2018-07-27 DIAGNOSIS — O99612 Diseases of the digestive system complicating pregnancy, second trimester: Secondary | ICD-10-CM

## 2018-07-27 DIAGNOSIS — O468X2 Other antepartum hemorrhage, second trimester: Secondary | ICD-10-CM

## 2018-07-27 NOTE — Progress Notes (Signed)
   PRENATAL VISIT NOTE  Subjective:  Sara Walsh is a 32 y.o. [id number] at [redacted]w[redacted]d being seen today for ongoing prenatal care.  She is currently monitored for the following issues for this high-risk pregnancy and has Previous cesarean section; Uterine fibroid; Nausea & vomiting; Chronic hypertension affecting pregnancy; Supervision of high risk pregnancy, antepartum; and Constipation during pregnancy on their problem list.  Patient reports no complaints.  Contractions: Not present. Vag. Bleeding: None.  Movement: Present. Denies leaking of fluid.   The following portions of the patient's history were reviewed and updated as appropriate: allergies, current medications, past family history, past medical history, past social history, past surgical history and problem list. Problem list updated.  Objective:   Vitals:   07/27/18 1614  BP: 131/83  Pulse: 82  Weight: 241 lb 14.4 oz (109.7 kg)    Fetal Status: Fetal Heart Rate (bpm): 143   Movement: Present     General:  Alert, oriented and cooperative. Patient is in no acute distress.  Skin: Skin is warm and dry. No rash noted.   Cardiovascular: Normal heart rate noted  Respiratory: Normal respiratory effort, no problems with respiration noted  Abdomen: Soft, gravid, appropriate for gestational age.  Pain/Pressure: Absent     Pelvic: Cervical exam deferred        Extremities: Normal range of motion.  Edema: None  Mental Status: Normal mood and affect. Normal behavior. Normal judgment and thought content.   Assessment and Plan:  Pregnancy: [id number] at [redacted]w[redacted]d  1. Supervision of high risk pregnancy, antepartum Fht and FH normal  2. Chronic hypertension in pregnancy BP controlled  3. Previous cesarean section   Preterm labor symptoms and general obstetric precautions including but not limited to vaginal bleeding, contractions, leaking of fluid and fetal movement were reviewed in detail with the patient. Please refer to After Visit  Summary for other counseling recommendations.  Return in about 4 weeks (around 08/24/2018) for HR OB f/u.  Future Appointments  Date Time Provider [place]  08/24/2018  2:00 PM WH-MFC [place] 3 WH-MFCUS MFC-US    Jacob J Stinson, DO

## 2018-08-05 ENCOUNTER — Encounter: Payer: Medicaid Other | Admitting: Obstetrics and Gynecology

## 2018-08-20 ENCOUNTER — Other Ambulatory Visit (HOSPITAL_COMMUNITY): Payer: Self-pay | Admitting: *Deleted

## 2018-08-24 ENCOUNTER — Ambulatory Visit (HOSPITAL_COMMUNITY)
Admission: RE | Admit: 2018-08-24 | Discharge: 2018-08-24 | Disposition: A | Discharge status: Home / Self Care | Payer: Medicaid Other | Source: Ambulatory Visit | Attending: Advanced Practice Midwife | Admitting: Advanced Practice Midwife

## 2018-08-24 ENCOUNTER — Encounter (HOSPITAL_COMMUNITY): Payer: Self-pay

## 2018-08-24 ENCOUNTER — Ambulatory Visit (INDEPENDENT_AMBULATORY_CARE_PROVIDER_SITE_OTHER): Payer: Medicaid Other | Admitting: Family Medicine

## 2018-08-24 DIAGNOSIS — O0990 Supervision of high risk pregnancy, unspecified, unspecified trimester: Secondary | ICD-10-CM

## 2018-08-24 DIAGNOSIS — O99212 Obesity complicating pregnancy, second trimester: Secondary | ICD-10-CM

## 2018-08-24 DIAGNOSIS — Z362 Encounter for other antenatal screening follow-up: Secondary | ICD-10-CM | POA: Insufficient documentation

## 2018-08-24 DIAGNOSIS — O34219 Maternal care for unspecified type scar from previous cesarean delivery: Secondary | ICD-10-CM

## 2018-08-24 DIAGNOSIS — O3412 Maternal care for benign tumor of corpus uteri, second trimester: Secondary | ICD-10-CM

## 2018-08-24 DIAGNOSIS — O10013 Pre-existing essential hypertension complicating pregnancy, third trimester: Secondary | ICD-10-CM

## 2018-08-24 DIAGNOSIS — K5900 Constipation, unspecified: Secondary | ICD-10-CM

## 2018-08-24 DIAGNOSIS — D259 Leiomyoma of uterus, unspecified: Secondary | ICD-10-CM | POA: Diagnosis not present

## 2018-08-24 DIAGNOSIS — O10919 Unspecified pre-existing hypertension complicating pregnancy, unspecified trimester: Secondary | ICD-10-CM

## 2018-08-24 DIAGNOSIS — Z3A22 22 weeks gestation of pregnancy: Secondary | ICD-10-CM | POA: Diagnosis not present

## 2018-08-24 DIAGNOSIS — O99612 Diseases of the digestive system complicating pregnancy, second trimester: Secondary | ICD-10-CM

## 2018-08-24 LAB — POCT URINALYSIS DIP (DEVICE)
GLUCOSE, UA: NEGATIVE mg/dL
Hgb urine dipstick: NEGATIVE
Ketones, ur: NEGATIVE mg/dL
LEUKOCYTES UA: NEGATIVE
NITRITE: NEGATIVE
Protein, ur: 30 mg/dL — AB
Specific Gravity, Urine: >=1.03 (ref 1.005–1.030)
Urobilinogen, UA: 0.2 mg/dL (ref 0.0–1.0)
pH: 6.5 (ref 5.0–8.0)

## 2018-08-24 MED ORDER — ADULT BLOOD PRESSURE CUFF LG KIT: 1.0000 | PACK | Freq: Every day | 0 refills | Status: DC

## 2018-08-24 NOTE — Progress Notes (Signed)
   PRENATAL VISIT NOTE  Subjective:  Sara Walsh is a 32 y.o. [id number] at [redacted]w[redacted]d being seen today for ongoing prenatal care.  She is currently monitored for the following issues for this high-risk pregnancy and has Previous cesarean section; Uterine fibroid; Nausea & vomiting; Chronic hypertension affecting pregnancy; Supervision of high risk pregnancy, antepartum; and Constipation during pregnancy on their problem list.  Patient reports no complaints.  Contractions: Not present. Vag. Bleeding: None.  Movement: Present. Denies leaking of fluid.   The following portions of the patient's history were reviewed and updated as appropriate: allergies, current medications, past family history, past medical history, past social history, past surgical history and problem list. Problem list updated.  Objective:   Vitals:   08/24/18 1318  BP: 139/80  Pulse: (!) 114  Weight: 237 lb 4.8 oz (107.6 kg)    Fetal Status: Fetal Heart Rate (bpm): 156   Movement: Present     General:  Alert, oriented and cooperative. Patient is in no acute distress.  Skin: Skin is warm and dry. No rash noted.   Cardiovascular: Normal heart rate noted  Respiratory: Normal respiratory effort, no problems with respiration noted  Abdomen: Soft, gravid, appropriate for gestational age.  Pain/Pressure: Absent     Pelvic: Cervical exam deferred        Extremities: Normal range of motion.  Edema: None  Mental Status: Normal mood and affect. Normal behavior. Normal judgment and thought content.   Assessment and Plan:  Pregnancy: [id number] at [redacted]w[redacted]d  1. Supervision of high risk pregnancy, antepartum - doing well today - reviewed PreE precautions - prescribed BP cuff given hx of preE and cHTN   Preterm labor symptoms and general obstetric precautions including but not limited to vaginal bleeding, contractions, leaking of fluid and fetal movement were reviewed in detail with the patient. Please refer to After Visit  Summary for other counseling recommendations.  Return in about 4 weeks (around 09/21/2018) for HROB.  Future Appointments  Date Time Provider [place]  08/24/2018  2:00 PM WH-MFC [place] 3 WH-MFCUS MFC-US    Aura Camps, MD

## 2018-08-25 ENCOUNTER — Other Ambulatory Visit (HOSPITAL_COMMUNITY): Payer: Self-pay | Admitting: *Deleted

## 2018-08-25 DIAGNOSIS — O280 Abnormal hematological finding on antenatal screening of mother: Secondary | ICD-10-CM

## 2018-09-21 ENCOUNTER — Encounter: Payer: Medicaid Other | Admitting: Obstetrics & Gynecology

## 2018-09-21 ENCOUNTER — Ambulatory Visit (INDEPENDENT_AMBULATORY_CARE_PROVIDER_SITE_OTHER): Payer: Medicaid Other | Admitting: Family Medicine

## 2018-09-21 ENCOUNTER — Encounter (HOSPITAL_COMMUNITY): Payer: Self-pay

## 2018-09-21 ENCOUNTER — Ambulatory Visit (HOSPITAL_COMMUNITY)
Admission: RE | Admit: 2018-09-21 | Discharge: 2018-09-21 | Disposition: A | Discharge status: Home / Self Care | Payer: Medicaid Other | Source: Ambulatory Visit | Attending: Advanced Practice Midwife | Admitting: Advanced Practice Midwife

## 2018-09-21 ENCOUNTER — Other Ambulatory Visit (HOSPITAL_COMMUNITY): Payer: Self-pay | Admitting: *Deleted

## 2018-09-21 VITALS — BP 119/74 | HR 98 | Wt 239.7 lb

## 2018-09-21 DIAGNOSIS — Z362 Encounter for other antenatal screening follow-up: Secondary | ICD-10-CM

## 2018-09-21 DIAGNOSIS — Z3A26 26 weeks gestation of pregnancy: Secondary | ICD-10-CM | POA: Insufficient documentation

## 2018-09-21 DIAGNOSIS — D259 Leiomyoma of uterus, unspecified: Secondary | ICD-10-CM

## 2018-09-21 DIAGNOSIS — O10919 Unspecified pre-existing hypertension complicating pregnancy, unspecified trimester: Secondary | ICD-10-CM

## 2018-09-21 DIAGNOSIS — Z98891 History of uterine scar from previous surgery: Secondary | ICD-10-CM

## 2018-09-21 DIAGNOSIS — O0990 Supervision of high risk pregnancy, unspecified, unspecified trimester: Secondary | ICD-10-CM | POA: Diagnosis not present

## 2018-09-21 DIAGNOSIS — Z23 Encounter for immunization: Secondary | ICD-10-CM | POA: Diagnosis not present

## 2018-09-21 DIAGNOSIS — O280 Abnormal hematological finding on antenatal screening of mother: Secondary | ICD-10-CM | POA: Diagnosis not present

## 2018-09-21 DIAGNOSIS — O3412 Maternal care for benign tumor of corpus uteri, second trimester: Secondary | ICD-10-CM

## 2018-09-21 DIAGNOSIS — O0992 Supervision of high risk pregnancy, unspecified, second trimester: Secondary | ICD-10-CM

## 2018-09-21 DIAGNOSIS — O10912 Unspecified pre-existing hypertension complicating pregnancy, second trimester: Secondary | ICD-10-CM

## 2018-09-21 DIAGNOSIS — O10012 Pre-existing essential hypertension complicating pregnancy, second trimester: Secondary | ICD-10-CM | POA: Diagnosis not present

## 2018-09-21 DIAGNOSIS — O34219 Maternal care for unspecified type scar from previous cesarean delivery: Secondary | ICD-10-CM

## 2018-09-21 NOTE — Progress Notes (Signed)
   PRENATAL VISIT NOTE  Subjective:  Sara Walsh is a 32 y.o. [id number] at [redacted]w[redacted]d being seen today for ongoing prenatal care.  She is currently monitored for the following issues for this high-risk pregnancy and has Previous cesarean section; Uterine fibroid; Nausea & vomiting; Chronic hypertension affecting pregnancy; Supervision of high risk pregnancy, antepartum; and Constipation during pregnancy on their problem list.  Patient reports occasional vomiting.  Contractions: Not present. Vag. Bleeding: None.  Movement: Present. Denies leaking of fluid.   The following portions of the patient's history were reviewed and updated as appropriate: allergies, current medications, past family history, past medical history, past social history, past surgical history and problem list. Problem list updated.  Objective:   Vitals:   09/21/18 1425  BP: 119/74  Pulse: 98  Weight: 239 lb 11.2 oz (108.7 kg)    Fetal Status: Fetal Heart Rate (bpm): 153 Fundal Height: 28 cm Movement: Present     General:  Alert, oriented and cooperative. Patient is in no acute distress.  Skin: Skin is warm and dry. No rash noted.   Cardiovascular: Normal heart rate noted  Respiratory: Normal respiratory effort, no problems with respiration noted  Abdomen: Soft, gravid, appropriate for gestational age.  Pain/Pressure: Absent     Pelvic: Cervical exam deferred        Extremities: Normal range of motion.  Edema: None  Mental Status: Normal mood and affect. Normal behavior. Normal judgment and thought content.   Assessment and Plan:  Pregnancy: [id number] at [redacted]w[redacted]d  1. Supervision of high risk pregnancy, antepartum FHT and FH normal. Return in 2 weeks for 28 week labs. - Tdap vaccine greater than or equal to 7yo IM  2. Previous cesarean section Rpt at 39 weeks.  3. Chronic hypertension affecting pregnancy Growth today  4. Uterine leiomyoma, unspecified location   Preterm labor symptoms and general obstetric  precautions including but not limited to vaginal bleeding, contractions, leaking of fluid and fetal movement were reviewed in detail with the patient. Please refer to After Visit Summary for other counseling recommendations.  Return in about 4 weeks (around 10/19/2018) for HR OB f/u.  No future appointments.  Truett Mainland, DO

## 2018-09-28 ENCOUNTER — Encounter (HOSPITAL_COMMUNITY): Payer: Self-pay

## 2018-10-19 ENCOUNTER — Encounter (HOSPITAL_COMMUNITY): Payer: Self-pay | Admitting: *Deleted

## 2018-10-19 ENCOUNTER — Other Ambulatory Visit: Payer: Self-pay

## 2018-10-19 ENCOUNTER — Inpatient Hospital Stay (HOSPITAL_COMMUNITY)
Admission: AD | Admit: 2018-10-19 | Discharge: 2018-10-19 | Disposition: A | Discharge status: Home / Self Care | Payer: Medicaid Other | Source: Ambulatory Visit | Attending: Obstetrics and Gynecology | Admitting: Obstetrics and Gynecology

## 2018-10-19 ENCOUNTER — Other Ambulatory Visit: Payer: Self-pay | Admitting: *Deleted

## 2018-10-19 DIAGNOSIS — Z79899 Other long term (current) drug therapy: Secondary | ICD-10-CM | POA: Insufficient documentation

## 2018-10-19 DIAGNOSIS — Z8249 Family history of ischemic heart disease and other diseases of the circulatory system: Secondary | ICD-10-CM | POA: Diagnosis not present

## 2018-10-19 DIAGNOSIS — O163 Unspecified maternal hypertension, third trimester: Secondary | ICD-10-CM | POA: Diagnosis not present

## 2018-10-19 DIAGNOSIS — O10919 Unspecified pre-existing hypertension complicating pregnancy, unspecified trimester: Secondary | ICD-10-CM

## 2018-10-19 DIAGNOSIS — O99323 Drug use complicating pregnancy, third trimester: Secondary | ICD-10-CM | POA: Insufficient documentation

## 2018-10-19 DIAGNOSIS — O99613 Diseases of the digestive system complicating pregnancy, third trimester: Secondary | ICD-10-CM | POA: Diagnosis not present

## 2018-10-19 DIAGNOSIS — O479 False labor, unspecified: Secondary | ICD-10-CM

## 2018-10-19 DIAGNOSIS — Z3689 Encounter for other specified antenatal screening: Secondary | ICD-10-CM

## 2018-10-19 DIAGNOSIS — Z3A30 30 weeks gestation of pregnancy: Secondary | ICD-10-CM | POA: Diagnosis not present

## 2018-10-19 DIAGNOSIS — F17290 Nicotine dependence, other tobacco product, uncomplicated: Secondary | ICD-10-CM | POA: Diagnosis not present

## 2018-10-19 DIAGNOSIS — Z7982 Long term (current) use of aspirin: Secondary | ICD-10-CM | POA: Insufficient documentation

## 2018-10-19 DIAGNOSIS — O4700 False labor before 37 completed weeks of gestation, unspecified trimester: Secondary | ICD-10-CM

## 2018-10-19 DIAGNOSIS — K5900 Constipation, unspecified: Secondary | ICD-10-CM

## 2018-10-19 DIAGNOSIS — F17210 Nicotine dependence, cigarettes, uncomplicated: Secondary | ICD-10-CM | POA: Diagnosis not present

## 2018-10-19 DIAGNOSIS — O0990 Supervision of high risk pregnancy, unspecified, unspecified trimester: Secondary | ICD-10-CM

## 2018-10-19 DIAGNOSIS — O99283 Endocrine, nutritional and metabolic diseases complicating pregnancy, third trimester: Secondary | ICD-10-CM | POA: Diagnosis not present

## 2018-10-19 DIAGNOSIS — O4703 False labor before 37 completed weeks of gestation, third trimester: Secondary | ICD-10-CM

## 2018-10-19 DIAGNOSIS — F1290 Cannabis use, unspecified, uncomplicated: Secondary | ICD-10-CM | POA: Insufficient documentation

## 2018-10-19 DIAGNOSIS — O99213 Obesity complicating pregnancy, third trimester: Secondary | ICD-10-CM | POA: Diagnosis not present

## 2018-10-19 DIAGNOSIS — O0993 Supervision of high risk pregnancy, unspecified, third trimester: Secondary | ICD-10-CM | POA: Diagnosis not present

## 2018-10-19 DIAGNOSIS — E079 Disorder of thyroid, unspecified: Secondary | ICD-10-CM | POA: Diagnosis not present

## 2018-10-19 DIAGNOSIS — O99333 Smoking (tobacco) complicating pregnancy, third trimester: Secondary | ICD-10-CM | POA: Insufficient documentation

## 2018-10-19 DIAGNOSIS — O10913 Unspecified pre-existing hypertension complicating pregnancy, third trimester: Secondary | ICD-10-CM | POA: Diagnosis not present

## 2018-10-19 LAB — COMPREHENSIVE METABOLIC PANEL
ALBUMIN: 2.8 g/dL — AB (ref 3.5–5.0)
ALK PHOS: 90 U/L (ref 38–126)
ALT: 13 U/L (ref 0–44)
AST: 12 U/L — ABNORMAL LOW (ref 15–41)
Anion gap: 6 (ref 5–15)
BUN: 9 mg/dL (ref 6–20)
CALCIUM: 8.9 mg/dL (ref 8.9–10.3)
CO2: 21 mmol/L — AB (ref 22–32)
CREATININE: 0.66 mg/dL (ref 0.44–1.00)
Chloride: 106 mmol/L (ref 98–111)
GFR calc Af Amer: >60 mL/min (ref >60)
GFR calc non Af Amer: >60 mL/min (ref >60)
GLUCOSE: 81 mg/dL (ref 70–99)
Potassium: 4.3 mmol/L (ref 3.5–5.1)
SODIUM: 133 mmol/L — AB (ref 135–145)
Total Bilirubin: 0.4 mg/dL (ref 0.3–1.2)
Total Protein: 6.5 g/dL (ref 6.5–8.1)

## 2018-10-19 LAB — PROTEIN / CREATININE RATIO, URINE
CREATININE, URINE: 136 mg/dL
Protein Creatinine Ratio: 0.13 mg/mg{Cre} (ref 0.00–0.15)
TOTAL PROTEIN, URINE: 18 mg/dL

## 2018-10-19 LAB — URINALYSIS, ROUTINE W REFLEX MICROSCOPIC
BILIRUBIN URINE: NEGATIVE
Glucose, UA: NEGATIVE mg/dL
Hgb urine dipstick: NEGATIVE
Ketones, ur: NEGATIVE mg/dL
Leukocytes, UA: NEGATIVE
Nitrite: NEGATIVE
PROTEIN: NEGATIVE mg/dL
SPECIFIC GRAVITY, URINE: 1.017 (ref 1.005–1.030)
pH: 6 (ref 5.0–8.0)

## 2018-10-19 LAB — WET PREP, GENITAL
Clue Cells Wet Prep HPF POC: NONE SEEN
SPERM: NONE SEEN
Trich, Wet Prep: NONE SEEN
Yeast Wet Prep HPF POC: NONE SEEN

## 2018-10-19 LAB — CBC WITH DIFFERENTIAL/PLATELET
BASOS ABS: 0 10*3/uL (ref 0.0–0.1)
BASOS PCT: 0 %
EOS ABS: 0.1 10*3/uL (ref 0.0–0.5)
Eosinophils Relative: 1 %
HCT: 38.7 % (ref 36.0–46.0)
HEMOGLOBIN: 12.4 g/dL (ref 12.0–15.0)
Lymphocytes Relative: 30 %
Lymphs Abs: 2.9 10*3/uL (ref 0.7–4.0)
MCH: 23 pg — ABNORMAL LOW (ref 26.0–34.0)
MCHC: 32 g/dL (ref 30.0–36.0)
MCV: 71.9 fL — ABNORMAL LOW (ref 80.0–100.0)
Monocytes Absolute: 0.6 10*3/uL (ref 0.1–1.0)
Monocytes Relative: 6 %
NRBC: 0 % (ref 0.0–0.2)
Neutro Abs: 6.1 10*3/uL (ref 1.7–7.7)
Neutrophils Relative %: 63 %
Platelets: 273 10*3/uL (ref 150–400)
RBC: 5.38 MIL/uL — ABNORMAL HIGH (ref 3.87–5.11)
RDW: 16 % — ABNORMAL HIGH (ref 11.5–15.5)
WBC: 9.7 10*3/uL (ref 4.0–10.5)

## 2018-10-19 MED ORDER — NIFEDIPINE 10 MG PO CAPS
10.0000 mg | ORAL_CAPSULE | ORAL | Status: DC | PRN
  Administered 2018-10-19 (×2): 10 mg via ORAL
  Filled 2018-10-19 (×2): qty 1

## 2018-10-19 MED ORDER — LABETALOL HCL 5 MG/ML IV SOLN: 20.0000 mg | INTRAVENOUS | Status: DC | PRN

## 2018-10-19 MED ORDER — OXYCODONE-ACETAMINOPHEN 5-325 MG PO TABS
2.0000 | ORAL_TABLET | Freq: Once | ORAL | Status: AC
  Administered 2018-10-19: 2 via ORAL
  Filled 2018-10-19: qty 2

## 2018-10-19 MED ORDER — LACTATED RINGERS IV SOLN
Freq: Once | INTRAVENOUS | Status: AC
  Administered 2018-10-19: 18:00:00 via INTRAVENOUS

## 2018-10-19 MED ORDER — LABETALOL HCL 5 MG/ML IV SOLN: 40.0000 mg | INTRAVENOUS | Status: DC | PRN

## 2018-10-19 MED ORDER — HYDROXYZINE HCL 10 MG PO TABS
10.0000 mg | ORAL_TABLET | Freq: Once | ORAL | Status: DC
  Filled 2018-10-19: qty 1

## 2018-10-19 MED ORDER — HYDRALAZINE HCL 20 MG/ML IJ SOLN: 10.0000 mg | INTRAMUSCULAR | Status: DC | PRN

## 2018-10-19 MED ORDER — LABETALOL HCL 5 MG/ML IV SOLN: 80.0000 mg | INTRAVENOUS | Status: DC | PRN

## 2018-10-19 MED ORDER — NIFEDIPINE 10 MG PO CAPS
10.0000 mg | ORAL_CAPSULE | Freq: Once | ORAL | Status: AC
  Administered 2018-10-19: 10 mg via ORAL
  Filled 2018-10-19: qty 1

## 2018-10-19 NOTE — MAU Provider Note (Signed)
History     CSN: [id number]  Arrival date and time: 10/19/18 1701   First Provider Initiated Contact with Patient 10/19/18 1752      No chief complaint on file.  HPI Sara Walsh is a 32 y.o. [id number] at 30w5dwho presents to MAU with chief complaint of preterm contractions. This is a new problem, onset this morning. Pain is 6/10, bilateral low abdomen, does not radiate. Patient denies aggravating or alleviating factors. She has not taken medication or tried other treatments for this problem. She denies vaginal bleeding, leaking of fluid, decreased fetal movement, fever, falls, or recent illness.    Patient endorses smoking THC today but states her use has been significantly reduced during pregnancy  OB History    Gravida  11   Para  4   Term  4   Preterm  0   AB  6   Living  4     SAB  1   TAB  5   Ectopic  0   Multiple  0   Live Births  4           Past Medical History:  Diagnosis Date  . Chlamydia   . Fibroid   . Hypertension   . Obesity   . Scabies   . Thyroid disease   . Trichomonas infection     Past Surgical History:  Procedure Laterality Date  . CESAREAN SECTION     C/S x 4  . WISDOM TOOTH EXTRACTION      Family History  Problem Relation Age of Onset  . Hypertension Father   . Anesthesia problems Neg Hx     Social History   Tobacco Use  . Smoking status: Former Smoker    Types: Cigars, Cigarettes    Last attempt to quit: 09/09/2011    Years since quitting: 7.1  . Smokeless tobacco: Never Used  Substance Use Topics  . Alcohol use: No  . Drug use: Not Currently    Types: Marijuana    Comment: weekends    Allergies: No Known Allergies  Medications Prior to Admission  Medication Sig Dispense Refill Last Dose  . aspirin EC 81 MG tablet Take 1 tablet (81 mg total) by mouth daily. Start taking at [redacted] weeks gestation 30 tablet 11 Taking  . Blood Pressure Monitoring (ADULT BLOOD PRESSURE CUFF LG) KIT 1 kit by Does not  apply route daily. (Patient not taking: Reported on 09/21/2018) 1 each 0 Not Taking  . Prenatal Vit-Fe Fumarate-FA (PREPLUS) 27-1 MG TABS Take 1 tablet by mouth daily. 30 tablet 13 Taking    Review of Systems  Constitutional: Negative for chills, fatigue and fever.  Respiratory: Negative for shortness of breath.   Gastrointestinal: Positive for abdominal pain.  Genitourinary: Negative for difficulty urinating, dyspareunia, dysuria, flank pain, vaginal discharge and vaginal pain.  Musculoskeletal: Negative for back pain.  Neurological: Negative for headaches.  All other systems reviewed and are negative.  Physical Exam   Blood pressure 123/67, pulse 83, temperature 98.8 F (37.1 C), temperature source Oral, resp. rate 20, height _0  (1.626 m), weight 111.7 kg, last menstrual period 03/09/2018, SpO2 100 %, unknown if currently breastfeeding.  Physical Exam  Nursing note and vitals reviewed. Constitutional: She is oriented to person, place, and time. She appears well-developed and well-nourished.  Cardiovascular: Normal rate and intact distal pulses.  Respiratory: Effort normal.  GI: She exhibits no distension. There is no abdominal tenderness. There is no rebound, no guarding and  no CVA tenderness.  Gravid  Musculoskeletal: Normal range of motion.  Neurological: She is alert and oriented to person, place, and time.  Skin: Skin is warm and dry.  Psychiatric: She has a normal mood and affect. Her behavior is normal. Judgment and thought content normal.    MAU Course/MDM   --Closed cervix --Reactive fetal tracing: baseline 150, moderate variability, positive accelerations, no decelerations --[place]oco: irregular contractions q  2-5 min, palpate mild  Patient Vitals for the past 24 hrs:  BP Temp Temp src Pulse Resp SpO2 Height Weight  10/19/18 1916 123/67 - - 83 - - - -  10/19/18 1909 (!) 108/59 - - 81 - - - -  10/19/18 1846 (!) 150/97 - - 77 - - - -  10/19/18 1831 (!) 147/99 - - 75  - - - -  10/19/18 1824 (!) 151/102 - - 71 - - - -  10/19/18 1801 (!) 147/97 - - 86 - - - -  10/19/18 1745 (!) 142/110 - - 84 - - - -  10/19/18 1726 (!) 152/96 98.8 F (37.1 C) Oral 85 20 100 % _0  (1.626 m) 111.7 kg    Results for orders placed or performed during the hospital encounter of 10/19/18 (from the past 24 hour(s))  Urinalysis, Routine w reflex microscopic     Status: Abnormal   Collection Time: 10/19/18  5:39 PM  Result Value Ref Range   Color, Urine YELLOW YELLOW   APPearance CLOUDY (A) CLEAR   Specific Gravity, Urine 1.017 1.005 - 1.030   pH 6.0 5.0 - 8.0   Glucose, UA NEGATIVE NEGATIVE mg/dL   Hgb urine dipstick NEGATIVE NEGATIVE   Bilirubin Urine NEGATIVE NEGATIVE   Ketones, ur NEGATIVE NEGATIVE mg/dL   Protein, ur NEGATIVE NEGATIVE mg/dL   Nitrite NEGATIVE NEGATIVE   Leukocytes, UA NEGATIVE NEGATIVE   RBC / HPF 0-5 0 - 5 RBC/hpf   WBC, UA 0-5 0 - 5 WBC/hpf   Bacteria, UA MANY (A) NONE SEEN   Squamous Epithelial / LPF >50 (H) 0 - 5   Mucus PRESENT   Protein / creatinine ratio, urine     Status: None   Collection Time: 10/19/18  5:39 PM  Result Value Ref Range   Creatinine, Urine 136.00 mg/dL   Total Protein, Urine 18 mg/dL   Protein Creatinine Ratio 0.13 0.00 - 0.15 mg/mg[Cre]  Wet prep, genital     Status: Abnormal   Collection Time: 10/19/18  6:05 PM  Result Value Ref Range   Yeast Wet Prep HPF POC NONE SEEN NONE SEEN   Trich, Wet Prep NONE SEEN NONE SEEN   Clue Cells Wet Prep HPF POC NONE SEEN NONE SEEN   WBC, Wet Prep HPF POC FEW (A) NONE SEEN   Sperm NONE SEEN   CBC with Differential/Platelet     Status: Abnormal   Collection Time: 10/19/18  6:16 PM  Result Value Ref Range   WBC 9.7 4.0 - 10.5 K/uL   RBC 5.38 (H) 3.87 - 5.11 MIL/uL   Hemoglobin 12.4 12.0 - 15.0 g/dL   HCT 38.7 36.0 - 46.0 %   MCV 71.9 (L) 80.0 - 100.0 fL   MCH 23.0 (L) 26.0 - 34.0 pg   MCHC 32.0 30.0 - 36.0 g/dL   RDW 16.0 (H) 11.5 - 15.5 %   Platelets 273 150 - 400 K/uL    nRBC 0.0 0.0 - 0.2 %   Neutrophils Relative % 63 %   Neutro Abs 6.1  1.7 - 7.7 K/uL   Lymphocytes Relative 30 %   Lymphs Abs 2.9 0.7 - 4.0 K/uL   Monocytes Relative 6 %   Monocytes Absolute 0.6 0.1 - 1.0 K/uL   Eosinophils Relative 1 %   Eosinophils Absolute 0.1 0.0 - 0.5 K/uL   Basophils Relative 0 %   Basophils Absolute 0.0 0.0 - 0.1 K/uL  Comprehensive metabolic panel     Status: Abnormal   Collection Time: 10/19/18  6:16 PM  Result Value Ref Range   Sodium 133 (L) 135 - 145 mmol/L   Potassium 4.3 3.5 - 5.1 mmol/L   Chloride 106 98 - 111 mmol/L   CO2 21 (L) 22 - 32 mmol/L   Glucose, Bld 81 70 - 99 mg/dL   BUN 9 6 - 20 mg/dL   Creatinine, Ser 0.66 0.44 - 1.00 mg/dL   Calcium 8.9 8.9 - 10.3 mg/dL   Total Protein 6.5 6.5 - 8.1 g/dL   Albumin 2.8 (L) 3.5 - 5.0 g/dL   AST 12 (L) 15 - 41 U/L   ALT 13 0 - 44 U/L   Alkaline Phosphatase 90 38 - 126 U/L   Total Bilirubin 0.4 0.3 - 1.2 mg/dL   GFR calc non Af Amer >60 >60 mL/min   GFR calc Af Amer >60 >60 mL/min   Anion gap 6 5 - 15     Assessment and Plan  --32 y.o. [id number] at [redacted]w[redacted]d --Reactive fetal tracing --Closed cervix --Negative PEC labs --S/p IV fluid bolus, PO analgesia and Procardia x 3 --Patient denies pain at time of discharge --Discharge home in stable condition  F/U: Next OB appt 10/22/18  SDarlina Rumpf CNM 10/19/2018, 8:01 PM

## 2018-10-19 NOTE — MAU Note (Signed)
Urine sent to lab 

## 2018-10-19 NOTE — MAU Note (Signed)
Having pains like bad period cramps every 2-3 min.  Started this morning. No bleeding or leaking. Denies hx of PTL.

## 2018-10-19 NOTE — Discharge Instructions (Signed)

## 2018-10-20 LAB — GC/CHLAMYDIA PROBE AMP (~~LOC~~) NOT AT ARMC
CHLAMYDIA, DNA PROBE: NEGATIVE
Neisseria Gonorrhea: NEGATIVE

## 2018-10-22 ENCOUNTER — Encounter (HOSPITAL_COMMUNITY): Payer: Self-pay

## 2018-10-22 ENCOUNTER — Ambulatory Visit (HOSPITAL_COMMUNITY)
Admission: RE | Admit: 2018-10-22 | Discharge: 2018-10-22 | Disposition: A | Discharge status: Home / Self Care | Payer: Medicaid Other | Source: Ambulatory Visit | Attending: Advanced Practice Midwife | Admitting: Advanced Practice Midwife

## 2018-10-22 ENCOUNTER — Ambulatory Visit (INDEPENDENT_AMBULATORY_CARE_PROVIDER_SITE_OTHER): Payer: Medicaid Other | Admitting: Obstetrics and Gynecology

## 2018-10-22 ENCOUNTER — Other Ambulatory Visit: Payer: Medicaid Other

## 2018-10-22 ENCOUNTER — Encounter: Payer: Self-pay | Admitting: Obstetrics and Gynecology

## 2018-10-22 VITALS — BP 122/67 | HR 87 | Wt 246.1 lb

## 2018-10-22 DIAGNOSIS — Z3A31 31 weeks gestation of pregnancy: Secondary | ICD-10-CM | POA: Diagnosis not present

## 2018-10-22 DIAGNOSIS — O3510X0 Maternal care for (suspected) chromosomal abnormality in fetus, unspecified, not applicable or unspecified: Secondary | ICD-10-CM | POA: Insufficient documentation

## 2018-10-22 DIAGNOSIS — O3413 Maternal care for benign tumor of corpus uteri, third trimester: Secondary | ICD-10-CM | POA: Insufficient documentation

## 2018-10-22 DIAGNOSIS — O0990 Supervision of high risk pregnancy, unspecified, unspecified trimester: Secondary | ICD-10-CM

## 2018-10-22 DIAGNOSIS — O351XX0 Maternal care for (suspected) chromosomal abnormality in fetus, not applicable or unspecified: Secondary | ICD-10-CM | POA: Insufficient documentation

## 2018-10-22 DIAGNOSIS — D259 Leiomyoma of uterus, unspecified: Secondary | ICD-10-CM | POA: Diagnosis not present

## 2018-10-22 DIAGNOSIS — O10913 Unspecified pre-existing hypertension complicating pregnancy, third trimester: Secondary | ICD-10-CM

## 2018-10-22 DIAGNOSIS — O10919 Unspecified pre-existing hypertension complicating pregnancy, unspecified trimester: Secondary | ICD-10-CM

## 2018-10-22 DIAGNOSIS — Z361 Encounter for antenatal screening for raised alphafetoprotein level: Secondary | ICD-10-CM | POA: Diagnosis present

## 2018-10-22 DIAGNOSIS — O34219 Maternal care for unspecified type scar from previous cesarean delivery: Secondary | ICD-10-CM | POA: Insufficient documentation

## 2018-10-22 DIAGNOSIS — O280 Abnormal hematological finding on antenatal screening of mother: Secondary | ICD-10-CM

## 2018-10-22 DIAGNOSIS — O99213 Obesity complicating pregnancy, third trimester: Secondary | ICD-10-CM | POA: Insufficient documentation

## 2018-10-22 DIAGNOSIS — O10013 Pre-existing essential hypertension complicating pregnancy, third trimester: Secondary | ICD-10-CM | POA: Diagnosis not present

## 2018-10-22 DIAGNOSIS — O0993 Supervision of high risk pregnancy, unspecified, third trimester: Secondary | ICD-10-CM

## 2018-10-22 DIAGNOSIS — Z98891 History of uterine scar from previous surgery: Secondary | ICD-10-CM

## 2018-10-22 NOTE — Progress Notes (Addendum)
Prenatal Visit Note Date: 10/22/2018 Clinic: [facility]  Subjective:  Sara Walsh is a 32 y.o. [id number] at [redacted]w[redacted]d being seen today for ongoing prenatal care.  She is currently monitored for the following issues for this high-risk pregnancy and has Previous cesarean section; Uterine fibroid; Nausea & vomiting; Chronic hypertension affecting pregnancy; Supervision of high risk pregnancy, antepartum; Constipation during pregnancy; Elevated AFP; and Low fetal fraction on their problem list.  Patient reports no complaints.   Contractions: Not present. Vag. Bleeding: None.  Movement: Present. Denies leaking of fluid.   The following portions of the patient's history were reviewed and updated as appropriate: allergies, current medications, past family history, past medical history, past social history, past surgical history and problem list. Problem list updated.  Objective:   Vitals:   10/22/18 0951  BP: 122/67  Pulse: 87  Weight: 246 lb 1.6 oz (111.6 kg)    Fetal Status: Fetal Heart Rate (bpm): 145   Movement: Present     General:  Alert, oriented and cooperative. Patient is in no acute distress.  Skin: Skin is warm and dry. No rash noted.   Cardiovascular: Normal heart rate noted  Respiratory: Normal respiratory effort, no problems with respiration noted  Abdomen: Soft, gravid, appropriate for gestational age. Pain/Pressure: Absent     Pelvic:  Cervical exam deferred        Extremities: Normal range of motion.  Edema: Trace  Mental Status: Normal mood and affect. Normal behavior. Normal judgment and thought content.   Urinalysis:      Assessment and Plan:  Pregnancy: [id number] at [redacted]w[redacted]d  1. Chronic hypertension affecting pregnancy Doing well on no meds. Has growth today. D/w her re: qwk testing starting next week. Confirms low dose asa  2. Previous cesarean section Scheduled for 39wks. Would like to do it with someone she can see regularly here and  unfortunately can't do it with dr. Nehemiah Settle due to scheduling conflict and dr Rosana Hoes not available here for awhile. Will see about doing it at 39/1 with dr constant who here is soon.   3. Supervision of high risk pregnancy, antepartum Routine care. Desires depo now. 28wk labs today  4. Elevated AFP Getting cHTN ap testing already  5. Low fetal fraction D/w her re: low FF and declines to see genetics  Preterm labor symptoms and general obstetric precautions including but not limited to vaginal bleeding, contractions, leaking of fluid and fetal movement were reviewed in detail with the patient. Please refer to After Visit Summary for other counseling recommendations.  Return in about 1 week (around 10/29/2018) for nst/bpp. 2wk nst/bpp/hrob.   Aletha Halim, MD

## 2018-10-22 NOTE — Progress Notes (Signed)
Pt states no longer wants a Tubal.Is undecided of other options.[facility] handout

## 2018-10-23 ENCOUNTER — Other Ambulatory Visit (HOSPITAL_COMMUNITY): Payer: Self-pay | Admitting: *Deleted

## 2018-10-23 DIAGNOSIS — O10919 Unspecified pre-existing hypertension complicating pregnancy, unspecified trimester: Secondary | ICD-10-CM

## 2018-10-23 LAB — CBC
Hematocrit: 37 % (ref 34.0–46.6)
Hemoglobin: 12.4 g/dL (ref 11.1–15.9)
MCH: 23.3 pg — AB (ref 26.6–33.0)
MCHC: 33.5 g/dL (ref 31.5–35.7)
MCV: 69 fL — ABNORMAL LOW (ref 79–97)
Platelets: 282 10*3/uL (ref 150–450)
RBC: 5.33 x10E6/uL — AB (ref 3.77–5.28)
RDW: 16.3 % — ABNORMAL HIGH (ref 12.3–15.4)
WBC: 8.8 10*3/uL (ref 3.4–10.8)

## 2018-10-23 LAB — GLUCOSE TOLERANCE, 2 HOURS W/ 1HR
Glucose, 1 hour: 184 mg/dL — ABNORMAL HIGH (ref 65–179)
Glucose, 2 hour: 77 mg/dL (ref 65–152)
Glucose, Fasting: 78 mg/dL (ref 65–91)

## 2018-10-23 LAB — HIV ANTIBODY (ROUTINE TESTING W REFLEX): HIV Screen 4th Generation wRfx: NONREACTIVE

## 2018-10-23 LAB — RPR: RPR Ser Ql: NONREACTIVE

## 2018-10-26 ENCOUNTER — Encounter: Payer: Self-pay | Admitting: Obstetrics and Gynecology

## 2018-10-26 DIAGNOSIS — O24419 Gestational diabetes mellitus in pregnancy, unspecified control: Secondary | ICD-10-CM | POA: Insufficient documentation

## 2018-10-29 ENCOUNTER — Ambulatory Visit (INDEPENDENT_AMBULATORY_CARE_PROVIDER_SITE_OTHER): Payer: Medicaid Other | Admitting: *Deleted

## 2018-10-29 ENCOUNTER — Ambulatory Visit: Payer: Self-pay

## 2018-10-29 ENCOUNTER — Telehealth: Payer: Self-pay | Admitting: Family Medicine

## 2018-10-29 VITALS — BP 156/103 | HR 97 | Wt 246.6 lb

## 2018-10-29 DIAGNOSIS — O10919 Unspecified pre-existing hypertension complicating pregnancy, unspecified trimester: Secondary | ICD-10-CM

## 2018-10-29 DIAGNOSIS — O10913 Unspecified pre-existing hypertension complicating pregnancy, third trimester: Secondary | ICD-10-CM

## 2018-10-29 NOTE — Progress Notes (Signed)
Pt has elevated BP today. She denies H/A, visual disturbances or RUQ pain. Pre-e labs done following consult w/Dr. Kennon Rounds. Pt advised to return to hospital immediately if she develops sign and sx of pre-e. She voiced understanding.   Pt informed that the ultrasound is considered a limited OB ultrasound and is not intended to be a complete ultrasound exam.  Patient also informed that the ultrasound is not being completed with the intent of assessing for fetal or placental anomalies or any pelvic abnormalities.  Explained that the purpose of today's ultrasound is to assess for presentation, BPP and amniotic fluid volume.  Patient acknowledges the purpose of the exam and the limitations of the study.

## 2018-10-29 NOTE — Telephone Encounter (Signed)
Called patient about coming in to see diabetes education. Patient stated she would be here for the appointment.

## 2018-10-29 NOTE — Progress Notes (Signed)
NST:  Baseline: 135 bpm, Variability: Good {> 6 bpm), Accelerations: Reactive and Decelerations: Variable: ? in earlier part of strip

## 2018-10-30 LAB — PROTEIN / CREATININE RATIO, URINE
Creatinine, Urine: 144.2 mg/dL
Protein, Ur: 31.2 mg/dL
Protein/Creat Ratio: 216 mg/g creat — ABNORMAL HIGH (ref 0–200)

## 2018-10-30 LAB — CBC
Hematocrit: 38.4 % (ref 34.0–46.6)
Hemoglobin: 12.5 g/dL (ref 11.1–15.9)
MCH: 23 pg — ABNORMAL LOW (ref 26.6–33.0)
MCHC: 32.6 g/dL (ref 31.5–35.7)
MCV: 71 fL — ABNORMAL LOW (ref 79–97)
Platelets: 302 10*3/uL (ref 150–450)
RBC: 5.43 x10E6/uL — ABNORMAL HIGH (ref 3.77–5.28)
RDW: 16.3 % — ABNORMAL HIGH (ref 12.3–15.4)
WBC: 9.1 10*3/uL (ref 3.4–10.8)

## 2018-10-30 LAB — COMPREHENSIVE METABOLIC PANEL
ALT: 11 IU/L (ref 0–32)
AST: 10 IU/L (ref 0–40)
Albumin/Globulin Ratio: 1.1 — ABNORMAL LOW (ref 1.2–2.2)
Albumin: 3.4 g/dL — ABNORMAL LOW (ref 3.5–5.5)
Alkaline Phosphatase: 118 IU/L — ABNORMAL HIGH (ref 39–117)
BUN/Creatinine Ratio: 10 (ref 9–23)
BUN: 7 mg/dL (ref 6–20)
Bilirubin Total: 0.5 mg/dL (ref 0.0–1.2)
CO2: 20 mmol/L (ref 20–29)
Calcium: 9.1 mg/dL (ref 8.7–10.2)
Chloride: 100 mmol/L (ref 96–106)
Creatinine, Ser: 0.67 mg/dL (ref 0.57–1.00)
GFR calc Af Amer: 133 mL/min/{1.73_m2} (ref >59)
GFR calc non Af Amer: 115 mL/min/{1.73_m2} (ref >59)
Globulin, Total: 3.2 g/dL (ref 1.5–4.5)
Glucose: 64 mg/dL — ABNORMAL LOW (ref 65–99)
Potassium: 3.9 mmol/L (ref 3.5–5.2)
Sodium: 135 mmol/L (ref 134–144)
Total Protein: 6.6 g/dL (ref 6.0–8.5)

## 2018-10-31 IMAGING — US US OB COMP LESS 14 WK
1 series · 15 of 28 positions shown · non-contrast
Comparison: 05/18/2018, 05/13/2018

CLINICAL DATA: Bleeding, fibroids

EXAM:
OBSTETRIC <14 WK US AND TRANSVAGINAL OB US
TECHNIQUE: Both transabdominal and transvaginal ultrasound examinations were
performed for complete evaluation of the gestation as well as the
maternal uterus, adnexal regions, and pelvic cul-de-sac.
Transvaginal technique was performed to assess early pregnancy.

[Series 1: us ob comp less 14 wk · 31 acquisitions, 15 frames shown]
[im 1/31]
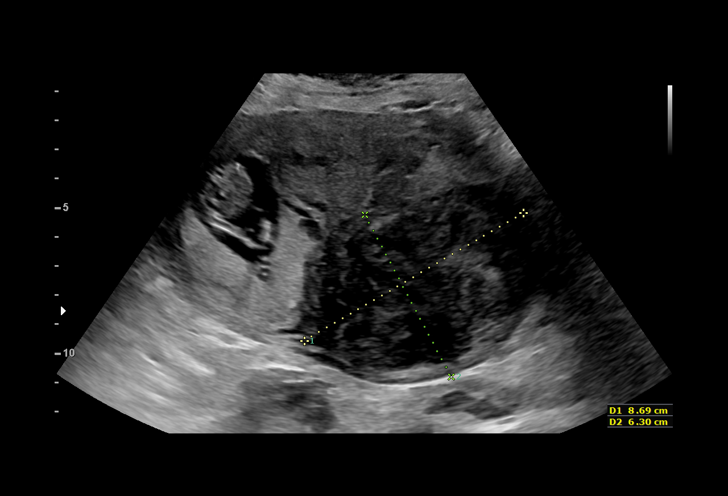
[im 3/31]
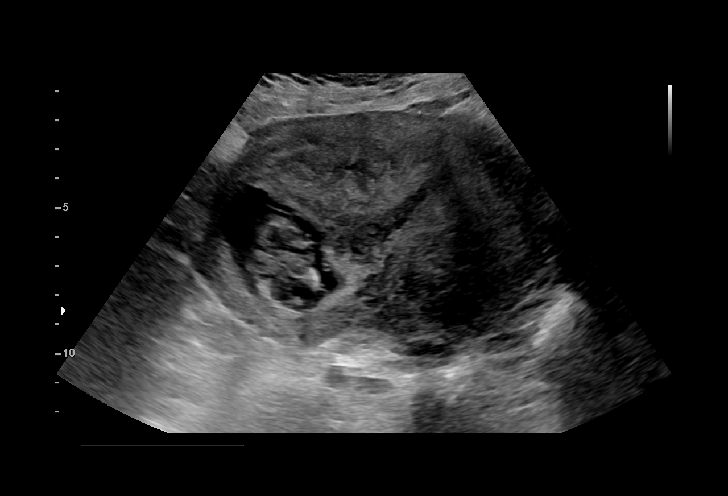
[im 5/31]
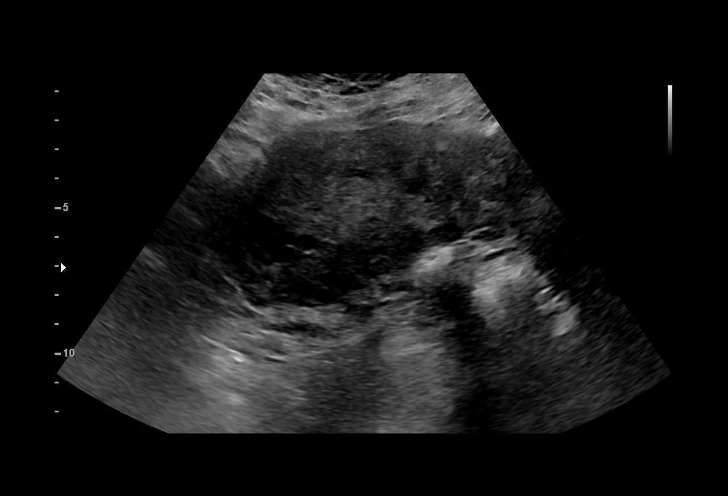
[im 7/31]
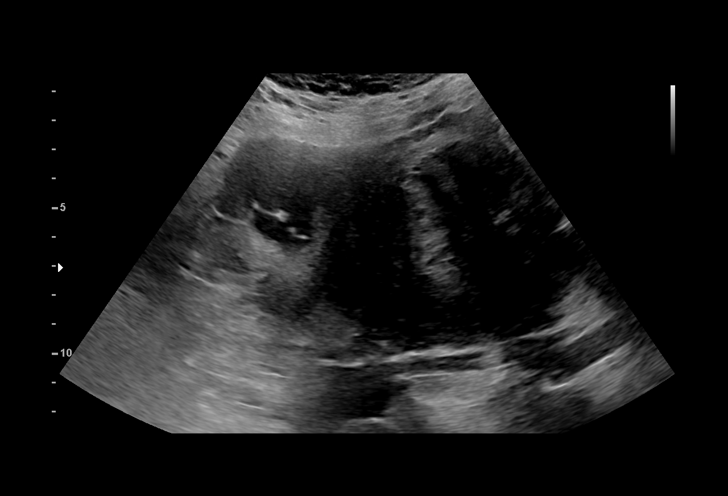
[im 9/31]
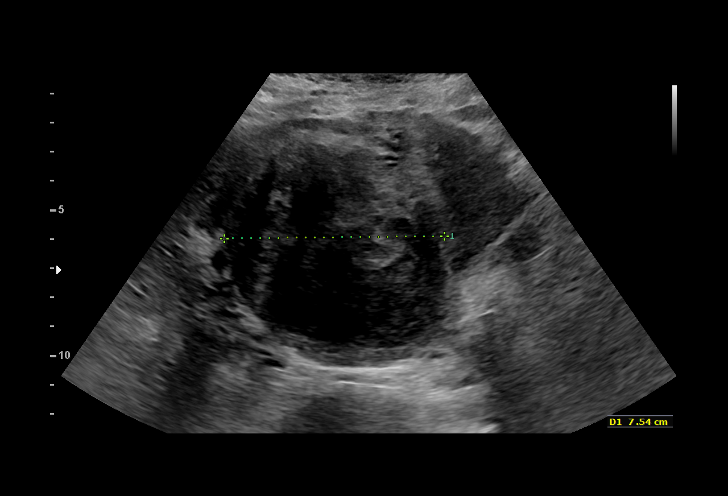
[im 12/31]
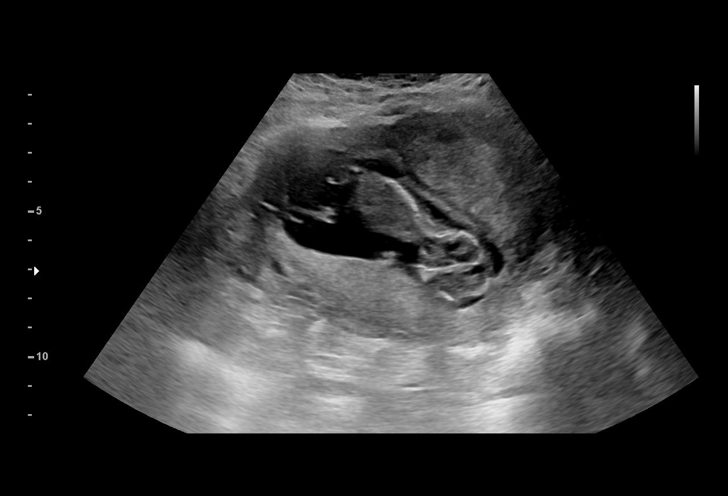
[im 14/31]
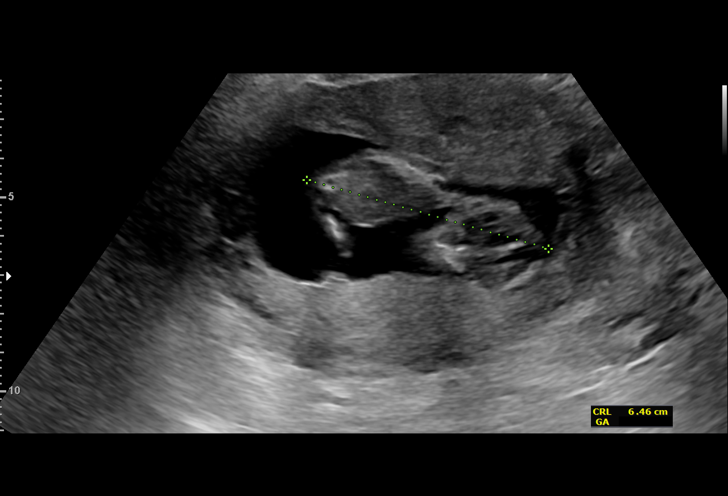
[im 16/31]
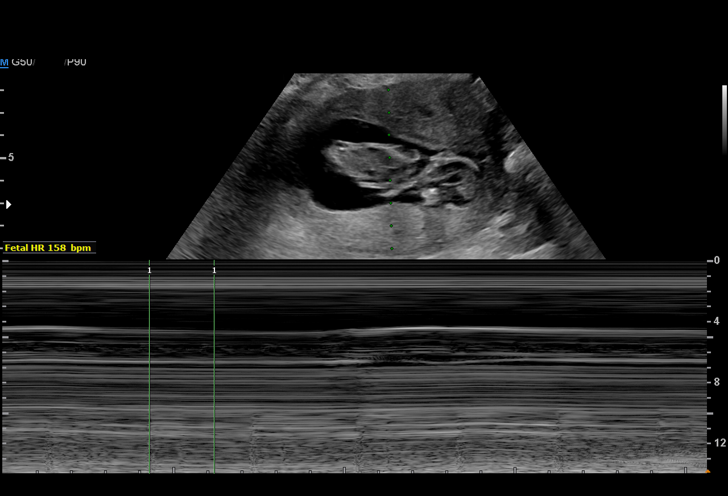
[im 17/31]
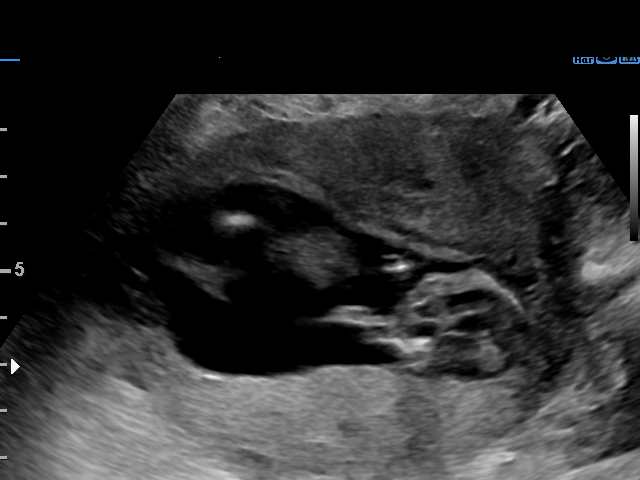
[im 19/31]
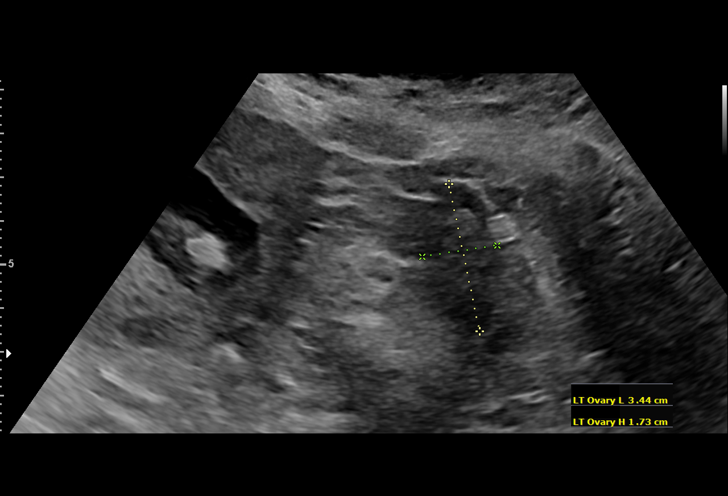
[im 22/31]
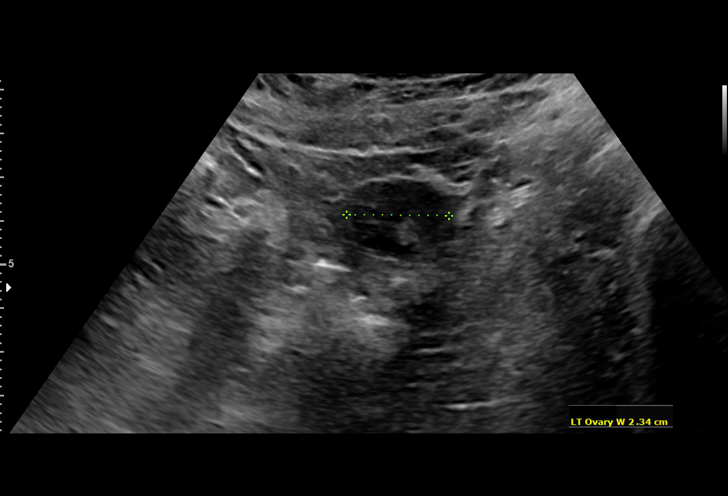
[im 24/31]
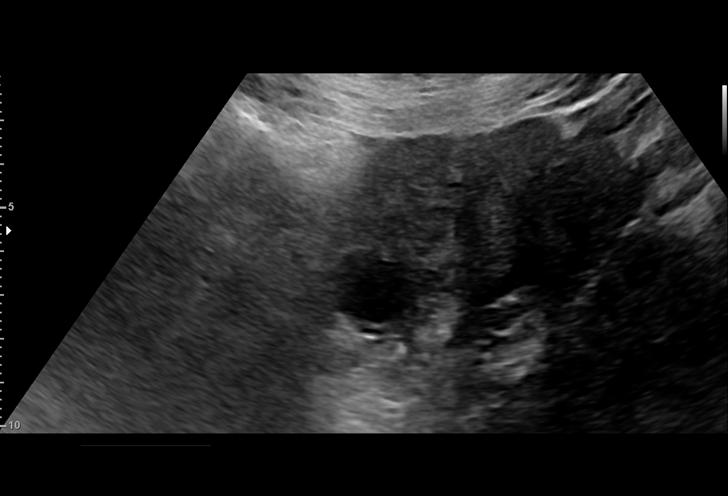
[im 26/31]
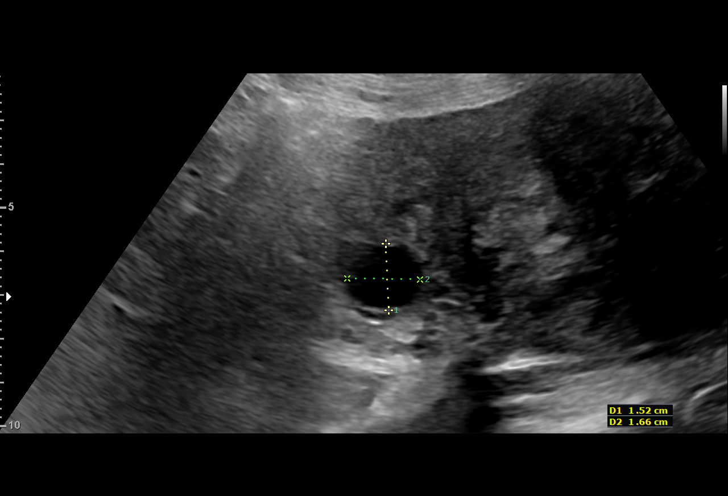
[im 28/31]
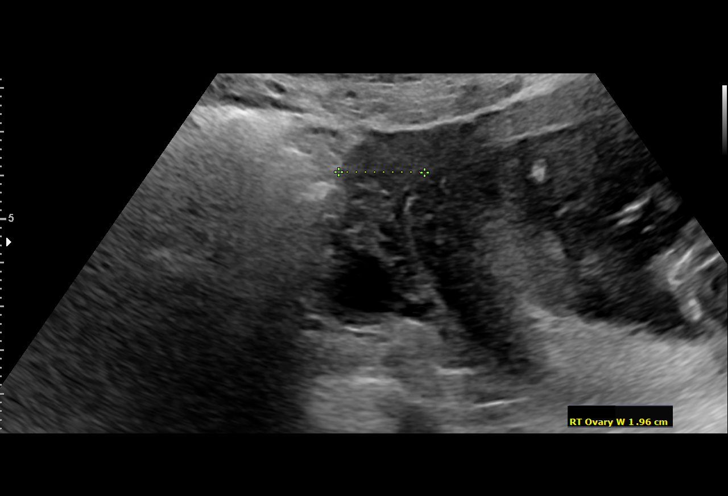
[im 31/31]
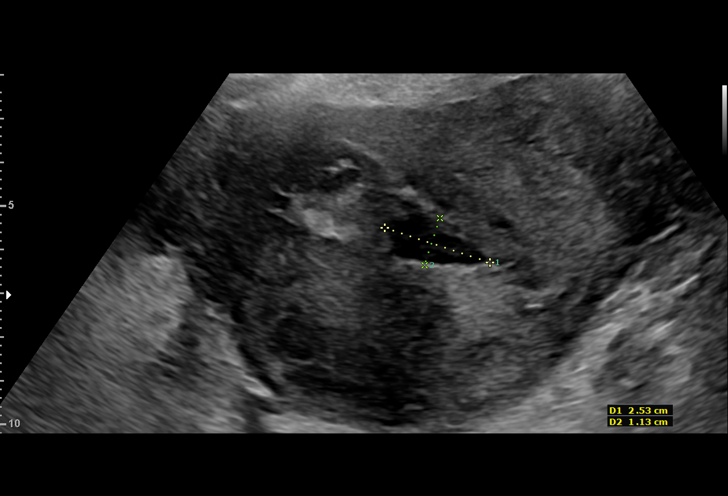

[15 of 28 positions shown; findings below may reference images not displayed]

FINDINGS: Intrauterine gestational sac: Single intrauterine gestation

Yolk sac:  Not seen

Embryo:  Visible

Cardiac Activity: Visible

Heart Rate: 158 bpm

CRL: 65.9 mm   12 w   6 d                  US EDC: 12/21/2018

Subchorionic hemorrhage: Small subchorionic hemorrhage inferiorly
measuring 2.5 x 1.1 x 1.1 cm, previously 0.9 cm. The second small
hemorrhage noted on 05/18/2018 is not seen today.

Maternal uterus/adnexae: Ovaries are within normal limits. The right
ovary measures 2 by 4.7 by 2.3 cm. The left ovary measures 2.3 by
3.4 x 1.7 cm. Fibroids in the uterus. Posterior fibroid measures
x 6.3 x 7.5 cm, previously 7.1 x 5.5 x 6.8 cm. No significant free
fluid
IMPRESSION: 1. Single viable intrauterine gestation as above.
2. Resolution of 1 of the previously noted small subchorionic
hemorrhages. Slight increased size of small inferior subchorionic
hemorrhage as compared with 05/18/2018.
3. Large posterior uterine fibroid measuring up to 8.7 cm.

## 2018-11-03 ENCOUNTER — Other Ambulatory Visit: Payer: Self-pay

## 2018-11-05 ENCOUNTER — Ambulatory Visit (INDEPENDENT_AMBULATORY_CARE_PROVIDER_SITE_OTHER): Payer: Medicaid Other | Admitting: *Deleted

## 2018-11-05 ENCOUNTER — Encounter: Payer: Self-pay | Admitting: Obstetrics and Gynecology

## 2018-11-05 ENCOUNTER — Ambulatory Visit (INDEPENDENT_AMBULATORY_CARE_PROVIDER_SITE_OTHER): Payer: Medicaid Other | Admitting: Obstetrics and Gynecology

## 2018-11-05 ENCOUNTER — Ambulatory Visit: Payer: Self-pay

## 2018-11-05 VITALS — BP 144/84 | HR 97 | Wt 250.4 lb

## 2018-11-05 DIAGNOSIS — O351XX0 Maternal care for (suspected) chromosomal abnormality in fetus, not applicable or unspecified: Secondary | ICD-10-CM

## 2018-11-05 DIAGNOSIS — O10919 Unspecified pre-existing hypertension complicating pregnancy, unspecified trimester: Secondary | ICD-10-CM

## 2018-11-05 DIAGNOSIS — O0990 Supervision of high risk pregnancy, unspecified, unspecified trimester: Secondary | ICD-10-CM

## 2018-11-05 DIAGNOSIS — O10913 Unspecified pre-existing hypertension complicating pregnancy, third trimester: Secondary | ICD-10-CM | POA: Diagnosis not present

## 2018-11-05 DIAGNOSIS — O0993 Supervision of high risk pregnancy, unspecified, third trimester: Secondary | ICD-10-CM

## 2018-11-05 DIAGNOSIS — Z3A33 33 weeks gestation of pregnancy: Secondary | ICD-10-CM

## 2018-11-05 DIAGNOSIS — O3510X0 Maternal care for (suspected) chromosomal abnormality in fetus, unspecified, not applicable or unspecified: Secondary | ICD-10-CM

## 2018-11-05 DIAGNOSIS — Z98891 History of uterine scar from previous surgery: Secondary | ICD-10-CM

## 2018-11-05 DIAGNOSIS — O24410 Gestational diabetes mellitus in pregnancy, diet controlled: Secondary | ICD-10-CM

## 2018-11-05 MED ORDER — GLUCOSE BLOOD VI STRP: ORAL_STRIP | 12 refills | Status: DC

## 2018-11-05 MED ORDER — ACCU-CHEK FASTCLIX LANCETS MISC: 1.0000 [IU] | Freq: Four times a day (QID) | 12 refills | Status: DC

## 2018-11-05 MED ORDER — ACCU-CHEK NANO SMARTVIEW W/DEVICE KIT: 1.0000 | PACK | 0 refills | Status: DC

## 2018-11-05 NOTE — Progress Notes (Signed)
Next [place] for growth scheduled on 1/23.

## 2018-11-05 NOTE — Progress Notes (Signed)
   PRENATAL VISIT NOTE  Subjective:  Sara Walsh is a 33 y.o. [id number] at [redacted]w[redacted]d being seen today for ongoing prenatal care.  She is currently monitored for the following issues for this high-risk pregnancy and has Previous cesarean section; Uterine fibroid; Nausea & vomiting; Chronic hypertension affecting pregnancy; Supervision of high risk pregnancy, antepartum; Constipation during pregnancy; Elevated AFP; Low fetal fraction; and GDM (gestational diabetes mellitus) on their problem list.  Patient reports no complaints.  Contractions: Not present. Vag. Bleeding: None.  Movement: Present. Denies leaking of fluid.   The following portions of the patient's history were reviewed and updated as appropriate: allergies, current medications, past family history, past medical history, past social history, past surgical history and problem list. Problem list updated.  Objective:   Vitals:   11/05/18 1044  BP: (!) 144/84  Pulse: 97  Weight: 250 lb 6.4 oz (113.6 kg)    Fetal Status: Fetal Heart Rate (bpm): NST   Movement: Present     General:  Alert, oriented and cooperative. Patient is in no acute distress.  Skin: Skin is warm and dry. No rash noted.   Cardiovascular: Normal heart rate noted  Respiratory: Normal respiratory effort, no problems with respiration noted  Abdomen: Soft, gravid, appropriate for gestational age.  Pain/Pressure: Present     Pelvic: Cervical exam deferred        Extremities: Normal range of motion.     Mental Status: Normal mood and affect. Normal behavior. Normal judgment and thought content.   Assessment and Plan:  Pregnancy: [id number] at [redacted]w[redacted]d  1. Supervision of high risk pregnancy, antepartum Patient is doing well without complaints  2. Chronic hypertension affecting pregnancy BP stable without medication Follow up growth 1/23 Continue antenatal testing  3. Previous cesarean section Scheduled for repeat  4. Diet controlled gestational diabetes  mellitus (GDM) in third trimester Patient missed diabetes education session- will help her reschedule Information provided Testing supplies ordered  Preterm labor symptoms and general obstetric precautions including but not limited to vaginal bleeding, contractions, leaking of fluid and fetal movement were reviewed in detail with the patient. Please refer to After Visit Summary for other counseling recommendations.  Return in about 1 week (around 11/12/2018) for as scheduled.  Future Appointments  Date Time Provider [place]  11/05/2018 11:15 AM Brooke Payes, Vickii Chafe, MD [place]  11/12/2018 10:15 AM [facility] NST [facility]  11/19/2018  9:15 AM [facility] NST [facility]  11/19/2018 10:30 AM WH-MFC [place] 1 WH-MFCUS MFC-US  11/26/2018 10:15 AM [facility] NST [facility]  11/26/2018 11:15 AM Emily Filbert, MD [facility]    Mora Bellman, MD

## 2018-11-05 NOTE — Progress Notes (Signed)

## 2018-11-10 ENCOUNTER — Other Ambulatory Visit: Payer: Medicaid Other

## 2018-11-12 ENCOUNTER — Telehealth: Payer: Self-pay | Admitting: Obstetrics & Gynecology

## 2018-11-12 ENCOUNTER — Ambulatory Visit (INDEPENDENT_AMBULATORY_CARE_PROVIDER_SITE_OTHER): Payer: Medicaid Other | Admitting: *Deleted

## 2018-11-12 ENCOUNTER — Inpatient Hospital Stay (EMERGENCY_DEPARTMENT_HOSPITAL)
Admission: AD | Admit: 2018-11-12 | Discharge: 2018-11-12 | Disposition: A | Discharge status: Home / Self Care | Payer: Medicaid Other | Source: Home / Self Care | Attending: Obstetrics & Gynecology | Admitting: Obstetrics & Gynecology

## 2018-11-12 ENCOUNTER — Encounter (HOSPITAL_COMMUNITY): Payer: Self-pay | Admitting: *Deleted

## 2018-11-12 ENCOUNTER — Inpatient Hospital Stay (HOSPITAL_COMMUNITY)
Admission: AD | Admit: 2018-11-12 | Discharge: 2018-11-12 | Disposition: A | Discharge status: Home / Self Care | Payer: Medicaid Other | Attending: Obstetrics & Gynecology | Admitting: Obstetrics & Gynecology

## 2018-11-12 ENCOUNTER — Ambulatory Visit: Payer: Self-pay

## 2018-11-12 ENCOUNTER — Encounter (HOSPITAL_COMMUNITY): Payer: Self-pay

## 2018-11-12 VITALS — BP 145/101 | HR 111 | Wt 249.9 lb

## 2018-11-12 DIAGNOSIS — Z87891 Personal history of nicotine dependence: Secondary | ICD-10-CM | POA: Insufficient documentation

## 2018-11-12 DIAGNOSIS — O34219 Maternal care for unspecified type scar from previous cesarean delivery: Secondary | ICD-10-CM | POA: Diagnosis not present

## 2018-11-12 DIAGNOSIS — O4693 Antepartum hemorrhage, unspecified, third trimester: Secondary | ICD-10-CM | POA: Insufficient documentation

## 2018-11-12 DIAGNOSIS — O24410 Gestational diabetes mellitus in pregnancy, diet controlled: Secondary | ICD-10-CM | POA: Diagnosis not present

## 2018-11-12 DIAGNOSIS — O10913 Unspecified pre-existing hypertension complicating pregnancy, third trimester: Secondary | ICD-10-CM | POA: Diagnosis not present

## 2018-11-12 DIAGNOSIS — Z3689 Encounter for other specified antenatal screening: Secondary | ICD-10-CM

## 2018-11-12 DIAGNOSIS — Z7982 Long term (current) use of aspirin: Secondary | ICD-10-CM

## 2018-11-12 DIAGNOSIS — R030 Elevated blood-pressure reading, without diagnosis of hypertension: Secondary | ICD-10-CM | POA: Diagnosis present

## 2018-11-12 DIAGNOSIS — Z3A34 34 weeks gestation of pregnancy: Secondary | ICD-10-CM | POA: Insufficient documentation

## 2018-11-12 DIAGNOSIS — O26893 Other specified pregnancy related conditions, third trimester: Secondary | ICD-10-CM

## 2018-11-12 DIAGNOSIS — O10013 Pre-existing essential hypertension complicating pregnancy, third trimester: Secondary | ICD-10-CM | POA: Insufficient documentation

## 2018-11-12 DIAGNOSIS — E669 Obesity, unspecified: Secondary | ICD-10-CM | POA: Insufficient documentation

## 2018-11-12 DIAGNOSIS — O10919 Unspecified pre-existing hypertension complicating pregnancy, unspecified trimester: Secondary | ICD-10-CM

## 2018-11-12 DIAGNOSIS — N898 Other specified noninflammatory disorders of vagina: Secondary | ICD-10-CM | POA: Insufficient documentation

## 2018-11-12 DIAGNOSIS — O99213 Obesity complicating pregnancy, third trimester: Secondary | ICD-10-CM | POA: Diagnosis not present

## 2018-11-12 DIAGNOSIS — O0990 Supervision of high risk pregnancy, unspecified, unspecified trimester: Secondary | ICD-10-CM

## 2018-11-12 LAB — COMPREHENSIVE METABOLIC PANEL
ALT: 12 U/L (ref 0–44)
AST: 14 U/L — ABNORMAL LOW (ref 15–41)
Albumin: 3.1 g/dL — ABNORMAL LOW (ref 3.5–5.0)
Alkaline Phosphatase: 98 U/L (ref 38–126)
Anion gap: 10 (ref 5–15)
BUN: 11 mg/dL (ref 6–20)
CHLORIDE: 107 mmol/L (ref 98–111)
CO2: 18 mmol/L — ABNORMAL LOW (ref 22–32)
Calcium: 8.6 mg/dL — ABNORMAL LOW (ref 8.9–10.3)
Creatinine, Ser: 0.62 mg/dL (ref 0.44–1.00)
GFR calc Af Amer: >60 mL/min (ref >60)
Glucose, Bld: 77 mg/dL (ref 70–99)
Potassium: 4 mmol/L (ref 3.5–5.1)
Sodium: 135 mmol/L (ref 135–145)
Total Bilirubin: 0.7 mg/dL (ref 0.3–1.2)
Total Protein: 7.2 g/dL (ref 6.5–8.1)

## 2018-11-12 LAB — CBC
HCT: 40.1 % (ref 36.0–46.0)
Hemoglobin: 12.9 g/dL (ref 12.0–15.0)
MCH: 23.1 pg — ABNORMAL LOW (ref 26.0–34.0)
MCHC: 32.2 g/dL (ref 30.0–36.0)
MCV: 71.7 fL — ABNORMAL LOW (ref 80.0–100.0)
PLATELETS: 244 10*3/uL (ref 150–400)
RBC: 5.59 MIL/uL — ABNORMAL HIGH (ref 3.87–5.11)
RDW: 16.1 % — ABNORMAL HIGH (ref 11.5–15.5)
WBC: 8.9 10*3/uL (ref 4.0–10.5)
nRBC: 0 % (ref 0.0–0.2)

## 2018-11-12 LAB — PROTEIN / CREATININE RATIO, URINE
Creatinine, Urine: 231 mg/dL
Protein Creatinine Ratio: 0.1 mg/mg{Cre} (ref 0.00–0.15)
Total Protein, Urine: 22 mg/dL

## 2018-11-12 MED ORDER — NIFEDIPINE ER OSMOTIC RELEASE 30 MG PO TB24: 30.0000 mg | ORAL_TABLET | Freq: Every day | ORAL | 1 refills | Status: DC

## 2018-11-12 MED ORDER — LABETALOL HCL 100 MG PO TABS: 100.0000 mg | ORAL_TABLET | Freq: Two times a day (BID) | ORAL | 1 refills | Status: DC

## 2018-11-12 NOTE — MAU Note (Addendum)
Pt here for slimy, clear vaginal discharge.  Pt noticed some pink tinged mucous as well. Pt denies pain. Reports good fetal movement. Was seen earlier for elevated BP. RX BP meds. Pt picked up but has not taken yet.

## 2018-11-12 NOTE — Telephone Encounter (Signed)
Left a voicemail informing the patient to call our clinic to schedule an appointment.

## 2018-11-12 NOTE — MAU Note (Signed)
Seen in clinic today for routine visit, b/p elevated and sent to MAU for eval. Denies headche or blurred vision

## 2018-11-12 NOTE — Discharge Instructions (Signed)
Vaginal Bleeding During Pregnancy, Third Trimester ° °A small amount of bleeding (spotting) from the vagina is common during pregnancy. Sometimes the bleeding is normal and is not a problem, and sometimes it is a sign of something serious. Tell your doctor about any bleeding from your vagina right away. °Follow these instructions at home: °Activity °· Follow your doctor's instructions about how active you can be. Your doctor may recommend that you: °? Stay in bed and only get up to use the bathroom. °? Continue light activity. °· If needed, make plans for someone to help you with your normal activities. °· Ask your doctor if it is safe for you to drive. °· Do not lift anything that is heavier than 10 lb (4.5 kg) until your doctor says that this is safe. °· Do not have sex or orgasms until your doctor says that this is safe. °Medicines °· Take over-the-counter and prescription medicines only as told by your doctor. °· Do not take aspirin. It can cause bleeding. °General instructions °· Watch your condition for any changes. °· Write down: °? The number of pads you use each day. °? How often you change pads. °? How soaked (saturated) your pads are. °· Do not use tampons. °· Do not douche. °· If you pass any tissue from your vagina, save the tissue to show your doctor. °· Keep all follow-up visits as told by your doctor. This is important. °Contact a doctor if: °· You have vaginal bleeding at any time during pregnancy. °· You have cramps. °· You have a fever. °Get help right away if: °· You have very bad cramps. °· You have very bad pain in your back or belly (abdomen). °· You have a gush of fluid from your vagina. °· You pass large clots or a lot of tissue from your vagina. °· Your bleeding gets worse. °· You feel light-headed or weak. °· You pass out (faint). °· Your baby is moving less than usual, or not moving at all. °Summary °· Tell your doctor about any bleeding from your vagina right away. °· Follow instructions  from your doctor about how active you can be. You may need someone to help you with your normal activities. °This information is not intended to replace advice given to you by your health care provider. Make sure you discuss any questions you have with your health care provider. °Document Released: 02/28/2014 Document Revised: 01/15/2017 Document Reviewed: 01/15/2017 °Elsevier Interactive Patient Education © 2019 [employer] ° °

## 2018-11-12 NOTE — Progress Notes (Signed)
Pt informed that the ultrasound is considered a limited OB ultrasound and is not intended to be a complete ultrasound exam.  Patient also informed that the ultrasound is not being completed with the intent of assessing for fetal or placental anomalies or any pelvic abnormalities.  Explained that the purpose of today's ultrasound is to assess for presentation, BPP and amniotic fluid volume.  Patient acknowledges the purpose of the exam and the limitations of the study.    Pt reports decreased fetal movement over the last few days. She Felt good FM during BPP and NST today. Pt has elevated BP x3 today. She denies H/A or visual disturbances. Consult w/Dr. Rip Harbour and pt taken to MAU for further evaluation.

## 2018-11-12 NOTE — Discharge Instructions (Signed)
Hypertension During Pregnancy ° °Hypertension is also called high blood pressure. High blood pressure means that the force of your blood moving in your body is too strong. When you are pregnant, this condition should be watched carefully. It can cause problems for you and your baby. °Follow these instructions at home: °Eating and drinking ° °· Drink enough fluid to keep your pee (urine) pale yellow. °· Avoid caffeine. °Lifestyle °· Do not use any products that contain nicotine or tobacco, such as cigarettes and e-cigarettes. If you need help quitting, ask your doctor. °· Do not use alcohol or drugs. °· Avoid stress. °· Rest and get plenty of sleep. °General instructions °· Take over-the-counter and prescription medicines only as told by your doctor. °· While lying down, lie on your left side. This keeps pressure off your major blood vessels. °· While sitting or lying down, raise (elevate) your feet. Try putting some pillows under your lower legs. °· Exercise regularly. Ask your doctor what kinds of exercise are best for you. °· Keep all prenatal and follow-up visits as told by your doctor. This is important. °Contact a doctor if: °· You have symptoms that your doctor told you to watch for, such as: °? Throwing up (vomiting). °? Feeling sick to your stomach (nausea). °? Headache. °Get help right away if you have: °· Very bad belly pain that does not get better with treatment. °· A very bad headache that does not get better. °· Throwing up that does not get better with treatment. °· Sudden, fast weight gain. °· Sudden swelling in your hands, ankles, or face. °· Bleeding from your vagina. °· Blood in your pee. °· Fewer movements from your baby than usual. °· Blurry vision. °· Double vision. °· Muscle twitching. °· Sudden muscle tightening (spasms). °· Trouble breathing. °· Blue fingernails or lips. °Summary °· Hypertension is also called high blood pressure. High blood pressure means that the force of your blood moving  in your body is too strong. °· When you are pregnant, this condition should be watched carefully. It can cause problems for you and your baby. °· Get help right away if you have symptoms that your doctor told you to watch for. °This information is not intended to replace advice given to you by your health care provider. Make sure you discuss any questions you have with your health care provider. °Document Released: 11/16/2010 Document Revised: 09/30/2017 Document Reviewed: 06/25/2016 °Elsevier Interactive Patient Education © 2019 [employer] Inc. ° °

## 2018-11-12 NOTE — MAU Provider Note (Signed)
History     CSN: [id number]  Arrival date and time: 11/12/18 1205   First Provider Initiated Contact with Patient 11/12/18 1305      Chief Complaint  Patient presents with  . Hypertension   [id number] '@34' .1 wks sent from clinic for elevated BP. Denies HA, visual disturbances, RUQ pain, SOB, and CP. Reports good FM. No VB, LOF, or ctx. Her pregnancy is complicated by [facility] not on meds, obesity, A1GDM, and previous CS x4.   OB History    Gravida  11   Para  4   Term  4   Preterm  0   AB  6   Living  4     SAB  1   TAB  5   Ectopic  0   Multiple  0   Live Births  4           Past Medical History:  Diagnosis Date  . Chlamydia   . Fibroid   . Hypertension   . Obesity   . Scabies   . Thyroid disease   . Trichomonas infection     Past Surgical History:  Procedure Laterality Date  . CESAREAN SECTION     C/S x 4  . WISDOM TOOTH EXTRACTION      Family History  Problem Relation Age of Onset  . Hypertension Father   . Anesthesia problems Neg Hx     Social History   Tobacco Use  . Smoking status: Former Smoker    Types: Cigars, Cigarettes    Last attempt to quit: 09/09/2011    Years since quitting: 7.1  . Smokeless tobacco: Never Used  Substance Use Topics  . Alcohol use: No  . Drug use: Not Currently    Types: Marijuana    Comment: weekends    Allergies: No Known Allergies  No medications prior to admission.    Review of Systems  Eyes: Negative for visual disturbance.  Cardiovascular: Negative for leg swelling.  Gastrointestinal: Negative for abdominal pain.  Genitourinary: Negative for vaginal bleeding and vaginal discharge.  Neurological: Negative for headaches.   Physical Exam   Blood pressure (!) 146/96, pulse 84, temperature 98.4 F (36.9 C), temperature source Oral, resp. rate 17, height '5\' 4"'  (1.626 m), weight 113.3 kg, last menstrual period 03/09/2018, SpO2 100 %, unknown if currently breastfeeding. Patient Vitals for the  past 24 hrs:  BP Temp Temp src Pulse Resp SpO2 Height Weight  11/12/18 1430 - 98.4 F (36.9 C) Oral - - - - -  11/12/18 1346 (!) 146/96 - - 84 - - - -  11/12/18 1331 (!) 153/93 - - 86 - - - -  11/12/18 1316 (!) 145/93 - - 83 - - - -  11/12/18 1301 (!) 146/86 - - 82 - - - -  11/12/18 1242 (!) 149/82 - - 78 - - - -  11/12/18 1220 (!) 148/108 98.4 F (36.9 C) Oral - 17 100 % '5\' 4"'  (1.626 m) 113.3 kg   Physical Exam  Nursing note and vitals reviewed. Constitutional: She is oriented to person, place, and time. She appears well-developed and well-nourished. No distress.  HENT:  Head: Normocephalic and atraumatic.  Neck: Normal range of motion.  Cardiovascular: Normal rate.  Respiratory: Effort normal. No respiratory distress.  Musculoskeletal: Normal range of motion.  Neurological: She is alert and oriented to person, place, and time.  Psychiatric: She has a normal mood and affect.  EFM: 135 bpm, mod variability, + accels, no decels  Toco: none  Results for orders placed or performed during the hospital encounter of 11/12/18 (from the past 24 hour(s))  Protein / creatinine ratio, urine     Status: None   Collection Time: 11/12/18 12:33 PM  Result Value Ref Range   Creatinine, Urine 231.00 mg/dL   Total Protein, Urine 22 mg/dL   Protein Creatinine Ratio 0.10 0.00 - 0.15 mg/mg[Cre]  CBC     Status: Abnormal   Collection Time: 11/12/18 12:36 PM  Result Value Ref Range   WBC 8.9 4.0 - 10.5 K/uL   RBC 5.59 (H) 3.87 - 5.11 MIL/uL   Hemoglobin 12.9 12.0 - 15.0 g/dL   HCT 40.1 36.0 - 46.0 %   MCV 71.7 (L) 80.0 - 100.0 fL   MCH 23.1 (L) 26.0 - 34.0 pg   MCHC 32.2 30.0 - 36.0 g/dL   RDW 16.1 (H) 11.5 - 15.5 %   Platelets 244 150 - 400 K/uL   nRBC 0.0 0.0 - 0.2 %  Comprehensive metabolic panel     Status: Abnormal   Collection Time: 11/12/18 12:36 PM  Result Value Ref Range   Sodium 135 135 - 145 mmol/L   Potassium 4.0 3.5 - 5.1 mmol/L   Chloride 107 98 - 111 mmol/L   CO2 18 (L) 22  - 32 mmol/L   Glucose, Bld 77 70 - 99 mg/dL   BUN 11 6 - 20 mg/dL   Creatinine, Ser 0.62 0.44 - 1.00 mg/dL   Calcium 8.6 (L) 8.9 - 10.3 mg/dL   Total Protein 7.2 6.5 - 8.1 g/dL   Albumin 3.1 (L) 3.5 - 5.0 g/dL   AST 14 (L) 15 - 41 U/L   ALT 12 0 - 44 U/L   Alkaline Phosphatase 98 38 - 126 U/L   Total Bilirubin 0.7 0.3 - 1.2 mg/dL   GFR calc non Af Amer >60 >60 mL/min   GFR calc Af Amer >60 >60 mL/min   Anion gap 10 5 - 15   MAU Course  Procedures Orders Placed This Encounter  Procedures  . CBC    Standing Status:   Standing    Number of Occurrences:   1  . Comprehensive metabolic panel    Standing Status:   Standing    Number of Occurrences:   1  . Protein / creatinine ratio, urine    Standing Status:   Standing    Number of Occurrences:   1  . Discharge patient    Order Specific Question:   Discharge disposition    Answer:   01-Home or Self Care [1]    Order Specific Question:   Discharge patient date    Answer:   11/12/2018   MDM Labs ordered and reviewed. No evidence of PEC. Consult with Dr. Roselie Awkward, recommend starting Labetalol. Discussed with pt, and she's refusing to take Labetalol, states it makes her too tired feeling and doesn't work for her. Will start Procardia XL. Stable for discharge home.   Assessment and Plan   1. [redacted] weeks gestation of pregnancy   2. Supervision of high risk pregnancy, antepartum   3. Chronic hypertension affecting pregnancy   4. NST (non-stress test) reactive   5. Chronic hypertension in pregnancy    Discharge home Follow up in [facility] clinic for BP check- message sent PEC precautions Rx Procardia 30 XL daily  Allergies as of 11/12/2018   No Known Allergies     Medication List    TAKE these medications   ACCU-CHEK  FASTCLIX LANCETS Misc 1 Units by Percutaneous route 4 (four) times daily.   ACCU-CHEK NANO SMARTVIEW w/Device Kit 1 kit by Subdermal route as directed. Check blood sugars for fasting, and two hours after breakfast, lunch  and dinner (4 checks daily)   Adult Blood Pressure Cuff Lg Kit 1 kit by Does not apply route daily.   aspirin EC 81 MG tablet Take 1 tablet (81 mg total) by mouth daily. Start taking at [redacted] weeks gestation   glucose blood test strip Commonly known as:  ACCU-CHEK SMARTVIEW Use as instructed to check blood sugars   NIFEdipine 30 MG 24 hr tablet Commonly known as:  PROCARDIA-XL/NIFEDICAL-XL Take 1 tablet (30 mg total) by mouth daily.   PREPLUS 27-1 MG Tabs Take 1 tablet by mouth daily.      Julianne Handler, CNM 11/12/2018, 2:54 PM

## 2018-11-12 NOTE — MAU Provider Note (Signed)
Chief Complaint:  Vaginal Discharge   First Provider Initiated Contact with Patient 11/12/18 2306     HPI: Sara Walsh is a 33 y.o. [id number] at 21w1dho presents to maternity admissions reporting "slimy" vaginal mucous.  Had a little pink tinge to it once.  No pain. She reports good fetal movement, denies LOF, vaginal bleeding, vaginal itching/burning, urinary symptoms, h/a, dizziness, n/v, diarrhea, constipation or fever/chills.  She denies headache, visual changes or RUQ abdominal pain.  Was seen earlier today for hypertension in pregnancy. She was started on antihypertensives.  Filled Rx but has not take first one yet.   Vaginal Discharge  The patient's primary symptoms include vaginal bleeding and vaginal discharge. The patient's pertinent negatives include no genital itching, genital lesions, genital odor or pelvic pain. This is a new problem. The current episode started today. The problem occurs intermittently. The patient is experiencing no pain. She is pregnant. Pertinent negatives include no abdominal pain, chills, constipation, diarrhea, dysuria, fever or headaches. The vaginal discharge was bloody, clear and mucoid. The vaginal bleeding is spotting. She has not been passing clots. She has not been passing tissue. Nothing aggravates the symptoms. She has tried nothing for the symptoms.    RN Note: Pt here for slimy, clear vaginal discharge.  Pt noticed some pink tinged mucous as well. Pt denies pain. Reports good fetal movement. Was seen earlier for elevated BP. RX BP meds. Pt picked up but has not taken yet.   Past Medical History: Past Medical History:  Diagnosis Date  . Chlamydia   . Fibroid   . Hypertension   . Obesity   . Scabies   . Thyroid disease   . Trichomonas infection     Past obstetric history: OB History  Gravida Para Term Preterm AB Living  _0 0 6 4  SAB TAB Ectopic Multiple Live Births  1 5 0 0 4    # Outcome Date GA Lbr Len/2nd Weight Sex  Delivery Anes PTL Lv  11 Current           10 Term 05/11/12 360w0d M CS-Unspec   LIV  9 TAB 03/23/09          8 Term 01/07/07 [id number] CS-Unspec   LIV  7 Term 07/24/05 37w42w0d CS-Unspec   LIV  6 Term 05/05/02 [id number]-Unspec   LIV  5 SAB           4 TAB           3 TAB           2 TAB              Birth Comments: System Generated. Please review and update pregnancy details.  1 TAB             Past Surgical History: Past Surgical History:  Procedure Laterality Date  . CESAREAN SECTION     C/S x 4  . WISDOM TOOTH EXTRACTION      Family History: Family History  Problem Relation Age of Onset  . Hypertension Father   . Anesthesia problems Neg Hx     Social History: Social History   Tobacco Use  . Smoking status: Former Smoker    Types: Cigars, Cigarettes    Last attempt to quit: 09/09/2011    Years since quitting: 7.1  . Smokeless tobacco: Never Used  Substance Use Topics  . Alcohol use: No  .  Drug use: Not Currently    Types: Marijuana    Comment: weekends    Allergies: No Known Allergies  Meds:  Medications Prior to Admission  Medication Sig Dispense Refill Last Dose  . ACCU-CHEK FASTCLIX LANCETS MISC 1 Units by Percutaneous route 4 (four) times daily. 100 each 12   . aspirin EC 81 MG tablet Take 1 tablet (81 mg total) by mouth daily. Start taking at [redacted] weeks gestation 30 tablet 11 Taking  . Blood Glucose Monitoring Suppl (ACCU-CHEK NANO SMARTVIEW) w/Device KIT 1 kit by Subdermal route as directed. Check blood sugars for fasting, and two hours after breakfast, lunch and dinner (4 checks daily) 1 kit 0   . Blood Pressure Monitoring (ADULT BLOOD PRESSURE CUFF LG) KIT 1 kit by Does not apply route daily. (Patient not taking: Reported on 09/21/2018) 1 each 0 Not Taking  . glucose blood (ACCU-CHEK SMARTVIEW) test strip Use as instructed to check blood sugars 100 each 12   . NIFEdipine (PROCARDIA-XL/NIFEDICAL-XL) 30 MG 24 hr tablet Take 1 tablet (30 mg total) by  mouth daily. 30 tablet 1   . Prenatal Vit-Fe Fumarate-FA (PREPLUS) 27-1 MG TABS Take 1 tablet by mouth daily. 30 tablet 13 Taking    I have reviewed patient's Past Medical Hx, Surgical Hx, Family Hx, Social Hx, medications and allergies.   ROS:  Review of Systems  Constitutional: Negative for chills and fever.  Gastrointestinal: Negative for abdominal pain, constipation and diarrhea.  Genitourinary: Positive for vaginal discharge. Negative for dysuria and pelvic pain.  Neurological: Negative for headaches.   Other systems negative  Physical Exam   Patient Vitals for the past 24 hrs:  BP Temp Temp src Pulse Resp SpO2 Height Weight  11/12/18 2231 (!) 142/96 - - 82 - - - -  11/12/18 2157 (!) 158/110 98 F (36.7 C) Oral 86 18 100 % _0  (1.626 m) 114.3 kg   Constitutional: Well-developed, well-nourished female in no acute distress.  Cardiovascular: normal rate and rhythm Respiratory: normal effort, clear to auscultation bilaterally GI: Abd soft, non-tender, gravid appropriate for gestational age.   No rebound or guarding. MS: Extremities nontender, no edema, normal ROM Neurologic: Alert and oriented x 4.  GU: Neg CVAT.  PELVIC EXAM: Cervix pink, visually closed, without lesion, moderate mucous discharge, vaginal walls and external genitalia normal    No blood seen on exam   Dilation: Closed Effacement (%): Thick Station: Ballotable Exam by:: Hansel Feinstein, CNM   FHT:  Baseline 140 , moderate variability, accelerations present, no decelerations Contractions:  Irregular     Labs: Results for orders placed or performed during the hospital encounter of 11/12/18 (from the past 24 hour(s))  Protein / creatinine ratio, urine     Status: None   Collection Time: 11/12/18 12:33 PM  Result Value Ref Range   Creatinine, Urine 231.00 mg/dL   Total Protein, Urine 22 mg/dL   Protein Creatinine Ratio 0.10 0.00 - 0.15 mg/mg[Cre]  CBC     Status: Abnormal   Collection Time: 11/12/18  12:36 PM  Result Value Ref Range   WBC 8.9 4.0 - 10.5 K/uL   RBC 5.59 (H) 3.87 - 5.11 MIL/uL   Hemoglobin 12.9 12.0 - 15.0 g/dL   HCT 40.1 36.0 - 46.0 %   MCV 71.7 (L) 80.0 - 100.0 fL   MCH 23.1 (L) 26.0 - 34.0 pg   MCHC 32.2 30.0 - 36.0 g/dL   RDW 16.1 (H) 11.5 - 15.5 %   Platelets 244  150 - 400 K/uL   nRBC 0.0 0.0 - 0.2 %  Comprehensive metabolic panel     Status: Abnormal   Collection Time: 11/12/18 12:36 PM  Result Value Ref Range   Sodium 135 135 - 145 mmol/L   Potassium 4.0 3.5 - 5.1 mmol/L   Chloride 107 98 - 111 mmol/L   CO2 18 (L) 22 - 32 mmol/L   Glucose, Bld 77 70 - 99 mg/dL   BUN 11 6 - 20 mg/dL   Creatinine, Ser 0.62 0.44 - 1.00 mg/dL   Calcium 8.6 (L) 8.9 - 10.3 mg/dL   Total Protein 7.2 6.5 - 8.1 g/dL   Albumin 3.1 (L) 3.5 - 5.0 g/dL   AST 14 (L) 15 - 41 U/L   ALT 12 0 - 44 U/L   Alkaline Phosphatase 98 38 - 126 U/L   Total Bilirubin 0.7 0.3 - 1.2 mg/dL   GFR calc non Af Amer >60 >60 mL/min   GFR calc Af Amer >60 >60 mL/min   Anion gap 10 5 - 15   O/Positive/-- (08/01 1616)  Imaging:    MAU Course/MDM: NST reviewed, reassuring  Treatments in MAU included EFM.    Assessment: Single intrauterine pregnancy at 40w2dVaginal discharge during pregnancy in third trimester - Plan: Discharge patient  Vaginal bleeding in pregnancy, third trimester - Reported pink discharge but negative exam - Plan: Discharge patient   Plan: Discharge home Preterm Labor precautions and fetal kick counts Follow up in Office for prenatal visits and recheck of BP  Encouraged to return here or to other Urgent Care/ED if she develops worsening of symptoms, increase in pain, fever, or other concerning symptoms.   Pt stable at time of discharge.  MHansel FeinsteinCNM, MSN Certified Nurse-Midwife 11/12/2018 11:06 PM

## 2018-11-19 ENCOUNTER — Ambulatory Visit (HOSPITAL_COMMUNITY): Admission: RE | Admit: 2018-11-19 | Payer: Medicaid Other | Source: Ambulatory Visit

## 2018-11-19 ENCOUNTER — Ambulatory Visit (HOSPITAL_COMMUNITY)
Admission: RE | Admit: 2018-11-19 | Discharge: 2018-11-19 | Disposition: A | Discharge status: Home / Self Care | Payer: Medicaid Other | Source: Ambulatory Visit | Attending: Advanced Practice Midwife | Admitting: Advanced Practice Midwife

## 2018-11-19 ENCOUNTER — Encounter: Payer: Medicaid Other | Attending: Obstetrics and Gynecology | Admitting: *Deleted

## 2018-11-19 ENCOUNTER — Ambulatory Visit (INDEPENDENT_AMBULATORY_CARE_PROVIDER_SITE_OTHER): Payer: Medicaid Other | Admitting: *Deleted

## 2018-11-19 ENCOUNTER — Other Ambulatory Visit (HOSPITAL_COMMUNITY)
Admission: RE | Admit: 2018-11-19 | Discharge: 2018-11-19 | Disposition: A | Discharge status: Home / Self Care | Payer: Medicaid Other | Source: Ambulatory Visit | Attending: Family Medicine | Admitting: Family Medicine

## 2018-11-19 ENCOUNTER — Other Ambulatory Visit (HOSPITAL_COMMUNITY): Payer: Self-pay | Admitting: *Deleted

## 2018-11-19 ENCOUNTER — Other Ambulatory Visit (HOSPITAL_COMMUNITY): Payer: Self-pay | Admitting: Maternal and Fetal Medicine

## 2018-11-19 ENCOUNTER — Encounter (HOSPITAL_COMMUNITY): Payer: Self-pay

## 2018-11-19 ENCOUNTER — Ambulatory Visit: Payer: Medicaid Other | Admitting: *Deleted

## 2018-11-19 VITALS — BP 158/95 | HR 75

## 2018-11-19 DIAGNOSIS — O10919 Unspecified pre-existing hypertension complicating pregnancy, unspecified trimester: Secondary | ICD-10-CM | POA: Insufficient documentation

## 2018-11-19 DIAGNOSIS — O99212 Obesity complicating pregnancy, second trimester: Secondary | ICD-10-CM | POA: Diagnosis not present

## 2018-11-19 DIAGNOSIS — Z3A00 Weeks of gestation of pregnancy not specified: Secondary | ICD-10-CM | POA: Diagnosis not present

## 2018-11-19 DIAGNOSIS — Z713 Dietary counseling and surveillance: Secondary | ICD-10-CM | POA: Diagnosis not present

## 2018-11-19 DIAGNOSIS — O24419 Gestational diabetes mellitus in pregnancy, unspecified control: Secondary | ICD-10-CM | POA: Diagnosis not present

## 2018-11-19 DIAGNOSIS — N898 Other specified noninflammatory disorders of vagina: Secondary | ICD-10-CM | POA: Diagnosis present

## 2018-11-19 DIAGNOSIS — O24410 Gestational diabetes mellitus in pregnancy, diet controlled: Secondary | ICD-10-CM

## 2018-11-19 DIAGNOSIS — Z3A35 35 weeks gestation of pregnancy: Secondary | ICD-10-CM

## 2018-11-19 DIAGNOSIS — O10913 Unspecified pre-existing hypertension complicating pregnancy, third trimester: Secondary | ICD-10-CM

## 2018-11-19 DIAGNOSIS — O34219 Maternal care for unspecified type scar from previous cesarean delivery: Secondary | ICD-10-CM | POA: Diagnosis not present

## 2018-11-19 HISTORY — DX: Gestational diabetes mellitus in pregnancy, unspecified control: O24.419

## 2018-11-19 MED ORDER — AMLODIPINE BESYLATE 10 MG PO TABS: 10.0000 mg | ORAL_TABLET | Freq: Every day | ORAL | 1 refills | Status: DC

## 2018-11-19 NOTE — Progress Notes (Signed)
Pt reports that she was prescribed medication for her high BP on 1/16. She took it once and it caused her to have very bad soreness at the back of her head the following morning. She has not taken the medication since that Lenah Messenger. She denies having other headaches since then. She also denies visual disturbances. Consult w/Dr. Ilda Basset and pt advised of Rx sent to pharmacy for Amlodipine 10 mg daily. Pt informed that minor side effects are common and usually resolve within a few days. She should notify our office immediately if she has extreme side effects which prevent her from taking the medication. In addition she reports having vaginal itching x2 days. Self swab obtained and sent to lab. She will be notified of results via [facility]. Pt advised she may use hydrocortisone to external genitalia. Pt vocied understanding of all information and instructions given. Pt has [place] for growth and BPP today @ 1030. She will return to office for appointment for Diabetes Education as scheduled.

## 2018-11-19 NOTE — Progress Notes (Signed)
  Patient was seen on 1/223/2020 for Gestational Diabetes self-management. EDD 12/23/2018 with C-section planned for 12/17/2018 per patient. Patient states no history of GDM. Diet history obtained. Patient eats good variety of all food groups. Beverages include water with some juice and previously to diagnosis, regular sodas and sweet tea.  The following learning objectives were met by the patient :   States the definition of Gestational Diabetes  States why dietary management is important in controlling blood glucose  Describes the effects of carbohydrates on blood glucose levels  Demonstrates ability to create a balanced meal plan  Demonstrates carbohydrate counting   States when to check blood glucose levels  Demonstrates proper blood glucose monitoring techniques  States the effect of stress and exercise on blood glucose levels  States the importance of limiting caffeine and abstaining from alcohol and smoking  Plan:  Aim for 3 Carb Choices per meal (45 grams) +/- 1 either way  Aim for 1-2 Carbs per snack Begin reading food labels for Total Carbohydrate of foods If OK with your MD, consider  increasing your activity level by walking, Arm Chair Exercises or other activity daily as tolerated Begin checking BG before breakfast and 2 hours after first bite of breakfast, lunch and dinner as directed by MD  Bring Log Book/Sheet to every medical appointment  OR Baby Scripts:  Patient was introduced to [employer] and plans to use as record of BG electronically  Take medication if directed by MD  Patient already has a meter: Accu Chek Guide which she just picked up and needs instruction Patient instructed to test pre breakfast and 2 hours each meal as directed by MD  Patient instructed to monitor glucose levels: FBS: 60 - 95 mg/dl 2 hour: <120 mg/dl  Patient received the following handouts:  Nutrition Diabetes and Pregnancy  Carbohydrate Counting List  Patient will be seen for  follow-up as needed.

## 2018-11-20 LAB — CERVICOVAGINAL ANCILLARY ONLY
Bacterial vaginitis: NEGATIVE
Candida vaginitis: POSITIVE — AB
Chlamydia: NEGATIVE
Neisseria Gonorrhea: NEGATIVE
TRICH (WINDOWPATH): NEGATIVE

## 2018-11-26 ENCOUNTER — Ambulatory Visit (INDEPENDENT_AMBULATORY_CARE_PROVIDER_SITE_OTHER): Payer: Medicaid Other | Admitting: Advanced Practice Midwife

## 2018-11-26 ENCOUNTER — Ambulatory Visit (HOSPITAL_BASED_OUTPATIENT_CLINIC_OR_DEPARTMENT_OTHER)
Admission: RE | Admit: 2018-11-26 | Discharge: 2018-11-26 | Disposition: A | Discharge status: Home / Self Care | Payer: Medicaid Other | Source: Ambulatory Visit | Attending: Advanced Practice Midwife | Admitting: Advanced Practice Midwife

## 2018-11-26 ENCOUNTER — Telehealth: Payer: Self-pay | Admitting: Obstetrics and Gynecology

## 2018-11-26 ENCOUNTER — Ambulatory Visit: Payer: Medicaid Other | Admitting: *Deleted

## 2018-11-26 ENCOUNTER — Telehealth (HOSPITAL_COMMUNITY): Payer: Self-pay | Admitting: *Deleted

## 2018-11-26 VITALS — BP 158/107 | HR 111 | Wt 249.6 lb

## 2018-11-26 DIAGNOSIS — Z3A36 36 weeks gestation of pregnancy: Secondary | ICD-10-CM

## 2018-11-26 DIAGNOSIS — O0990 Supervision of high risk pregnancy, unspecified, unspecified trimester: Secondary | ICD-10-CM

## 2018-11-26 DIAGNOSIS — O10919 Unspecified pre-existing hypertension complicating pregnancy, unspecified trimester: Secondary | ICD-10-CM

## 2018-11-26 DIAGNOSIS — O34219 Maternal care for unspecified type scar from previous cesarean delivery: Secondary | ICD-10-CM

## 2018-11-26 DIAGNOSIS — O99212 Obesity complicating pregnancy, second trimester: Secondary | ICD-10-CM | POA: Diagnosis not present

## 2018-11-26 DIAGNOSIS — O24410 Gestational diabetes mellitus in pregnancy, diet controlled: Secondary | ICD-10-CM | POA: Diagnosis not present

## 2018-11-26 DIAGNOSIS — O365990 Maternal care for other known or suspected poor fetal growth, unspecified trimester, not applicable or unspecified: Secondary | ICD-10-CM | POA: Insufficient documentation

## 2018-11-26 DIAGNOSIS — O280 Abnormal hematological finding on antenatal screening of mother: Secondary | ICD-10-CM

## 2018-11-26 DIAGNOSIS — O0993 Supervision of high risk pregnancy, unspecified, third trimester: Secondary | ICD-10-CM

## 2018-11-26 DIAGNOSIS — O10913 Unspecified pre-existing hypertension complicating pregnancy, third trimester: Secondary | ICD-10-CM

## 2018-11-26 DIAGNOSIS — O365930 Maternal care for other known or suspected poor fetal growth, third trimester, not applicable or unspecified: Secondary | ICD-10-CM

## 2018-11-26 DIAGNOSIS — Z98891 History of uterine scar from previous surgery: Secondary | ICD-10-CM

## 2018-11-26 LAB — GLUCOSE, CAPILLARY: Glucose-Capillary: 94 mg/dL (ref 70–99)

## 2018-11-26 NOTE — Telephone Encounter (Signed)
Called patient to inform her of CS date/time and when to come in to hospital tomorrow. Reminded not to eat/drink anything after midnight. Answered all questions, patient verbalized understanding.

## 2018-11-26 NOTE — Progress Notes (Signed)
Pt states she checked her blood sugar a few times after seeing Diabetes educator and then stopped. She denies H/A or visual disturbances. Pt has appt @ 1245 today for [place] @ MFM (UA cord doppler and BPP).

## 2018-11-26 NOTE — Progress Notes (Signed)
   PRENATAL VISIT NOTE  Subjective:  Sara Walsh is a 33 y.o. [id number] at [redacted]w[redacted]d being seen today for ongoing prenatal care.  She is currently monitored for the following issues for this high-risk pregnancy and has Previous cesarean section; Uterine fibroid; Nausea & vomiting; Chronic hypertension affecting pregnancy; Supervision of high risk pregnancy, antepartum; Constipation during pregnancy; Elevated AFP; Low fetal fraction; GDM (gestational diabetes mellitus); and Pregnancy affected by fetal growth restriction on their problem list.  Patient reports denies HA, vision changes, epigastric pain. .  Contractions: Irregular. Vag. Bleeding: None.  Movement: Present. Denies leaking of fluid.   The following portions of the patient's history were reviewed and updated as appropriate: allergies, current medications, past family history, past medical history, past social history, past surgical history and problem list. Problem list updated.  States she has been taking Norvasc as directed. C/O it maker her spit per RN.   Not checking many CBGs. Unable to recall any values. Nothing in Baby Rx.   Objective:   Vitals:   11/26/18 1138 11/26/18 1201  BP: (!) 151/104 (!) 158/107  Pulse: (!) 111   Weight: 249 lb 9.6 oz (113.2 kg)     Fetal Status: Fetal Heart Rate (bpm): NST Fundal Height: 35 cm Movement: Present     General:  Alert, oriented and cooperative. Patient is in no acute distress.  Skin: Skin is warm and dry. No rash noted.   Cardiovascular: Normal heart rate noted  Respiratory: Normal respiratory effort, no problems with respiration noted  Abdomen: Soft, gravid, appropriate for gestational age.  Pain/Pressure: Present     Pelvic: Cervical exam performed      0/0/-3  Extremities: Normal range of motion.     Mental Status: Normal mood and affect. Normal behavior. Normal judgment and thought content.   Assessment and Plan:  Pregnancy: [id number] at [redacted]w[redacted]d  1. Supervision of high  risk pregnancy, antepartum  - Strep Gp B NAA  2. Chronic hypertension affecting pregnancy--Uncontrolled, but unchanged form previous visits. No Sx of Pre-E  3. Fetal growth restriction - Reactive NST toady.  - AFI and dopplers in MFM - Will ask MFM about planning for delivery  4. Previous C/S--Repeat scheduled 12/17/18 - Discuss moving up C/S w/ MFM  Term labor symptoms and general obstetric precautions including but not limited to vaginal bleeding, contractions, leaking of fluid and fetal movement were reviewed in detail with the patient. Please refer to After Visit Summary for other counseling recommendations.  Return in about 1 week (around 12/03/2018) for as scheduled.  Future Appointments  Date Time Provider [place]  12/03/2018  9:15 AM WOC-WOCA NST [place]  12/03/2018 10:15 AM Constant, Vickii Chafe, MD WOC-WOCA WOC  12/03/2018 10:45 AM WH-MFC [place] 5 WH-MFCUS MFC-US  12/10/2018  9:15 AM WOC-WOCA NST WOC-WOCA WOC  12/10/2018 10:15 AM Chancy Milroy, MD WOC-WOCA WOC  12/10/2018 10:45 AM [place] 2 WH-MFCUS MFC-US    Manya Silvas, CNM

## 2018-11-26 NOTE — Patient Instructions (Signed)
.  Preeclampsia and Eclampsia    Preeclampsia is a serious condition that may develop during pregnancy. It is also called toxemia of pregnancy. This condition causes high blood pressure along with other symptoms, such as swelling and headaches. These symptoms may develop as the condition gets worse. Preeclampsia may occur at 20 weeks of pregnancy or later.  Diagnosing and treating preeclampsia early is very important. If not treated early, it can cause serious problems for you and your baby. One problem it can lead to is eclampsia. Eclampsia is a condition that causes muscle jerking or shaking (convulsions or seizures) and other serious problems for the mother. During pregnancy, delivering your baby may be the best treatment for preeclampsia or eclampsia. For most women, preeclampsia and eclampsia symptoms go away after giving birth.  In rare cases, a woman may develop preeclampsia after giving birth (postpartum preeclampsia). This usually occurs within 48 hours after childbirth but may occur up to 6 weeks after giving birth.  What are the causes?  The cause of preeclampsia is not known.  What increases the risk?  The following risk factors make you more likely to develop preeclampsia:   Being pregnant for the first time.   Having had preeclampsia during a past pregnancy.   Having a family history of preeclampsia.   Having high blood pressure.   Being pregnant with more than one baby.   Being 35 or older.   Being African-American.   Having kidney disease or diabetes.   Having medical conditions such as lupus or blood diseases.   Being very overweight (obese).  What are the signs or symptoms?  The earliest signs of preeclampsia are:   High blood pressure.   Increased protein in your urine. Your health care provider will check for this at every visit before you give birth (prenatal visit).  Other symptoms that may develop as the condition gets worse include:   Severe headaches.   Sudden weight  gain.   Swelling of the hands, face, legs, and feet.   Nausea and vomiting.   Vision problems, such as blurred or double vision.   Numbness in the face, arms, legs, and feet.   Urinating less than usual.   Dizziness.   Slurred speech.   Abdominal pain, especially upper abdominal pain.   Convulsions or seizures.  How is this diagnosed?  There are no screening tests for preeclampsia. Your health care provider will ask you about symptoms and check for signs of preeclampsia during your prenatal visits. You may also have tests that include:   Urine tests.   Blood tests.   Checking your blood pressure.   Monitoring your baby's heart rate.   Ultrasound.  How is this treated?  You and your health care provider will determine the treatment approach that is best for you. Treatment may include:   Having more frequent prenatal exams to check for signs of preeclampsia, if you have an increased risk for preeclampsia.   Medicine to lower your blood pressure.   Staying in the hospital, if your condition is severe. There, treatment will focus on controlling your blood pressure and the amount of fluids in your body (fluid retention).   Taking medicine (magnesium sulfate) to prevent seizures. This may be given as an injection or through an IV.   Taking a low-dose aspirin during your pregnancy.   Delivering your baby early, if your condition gets worse. You may have your labor started with medicine (induced), or you may have a cesarean   delivery.  Follow these instructions at home:  Eating and drinking     Drink enough fluid to keep your urine pale yellow.   Avoid caffeine.  Lifestyle   Do not use any products that contain nicotine or tobacco, such as cigarettes and e-cigarettes. If you need help quitting, ask your health care provider.   Do not use alcohol or drugs.   Avoid stress as much as possible. Rest and get plenty of sleep.  General instructions   Take over-the-counter and prescription medicines only as  told by your health care provider.   When lying down, lie on your left side. This keeps pressure off your major blood vessels.   When sitting or lying down, raise (elevate) your feet. Try putting some pillows underneath your lower legs.   Exercise regularly. Ask your health care provider what kinds of exercise are best for you.   Keep all follow-up and prenatal visits as told by your health care provider. This is important.  How is this prevented?  There is no known way of preventing preeclampsia or eclampsia from developing. However, to lower your risk of complications and detect problems early:   Get regular prenatal care. Your health care provider may be able to diagnose and treat the condition early.   Maintain a healthy weight. Ask your health care provider for help managing weight gain during pregnancy.   Work with your health care provider to manage any long-term (chronic) health conditions you have, such as diabetes or kidney problems.   You may have tests of your blood pressure and kidney function after giving birth.   Your health care provider may have you take low-dose aspirin during your next pregnancy.  Contact a health care provider if:   You have symptoms that your health care provider told you may require more treatment or monitoring, such as:  ? Headaches.  ? Nausea or vomiting.  ? Abdominal pain.  ? Dizziness.  ? Light-headedness.  Get help right away if:   You have severe:  ? Abdominal pain.  ? Headaches that do not get better.  ? Dizziness.  ? Vision problems.  ? Confusion.  ? Nausea or vomiting.   You have any of the following:  ? A seizure.  ? Sudden, rapid weight gain.  ? Sudden swelling in your hands, ankles, or face.  ? Trouble moving any part of your body.  ? Numbness in any part of your body.  ? Trouble speaking.  ? Abnormal bleeding.   You faint.  Summary   Preeclampsia is a serious condition that may develop during pregnancy. It is also called toxemia of pregnancy.   This  condition causes high blood pressure along with other symptoms, such as swelling and headaches.   Diagnosing and treating preeclampsia early is very important. If not treated early, it can cause serious problems for you and your baby.   Get help right away if you have symptoms that your health care provider told you to watch for.  This information is not intended to replace advice given to you by your health care provider. Make sure you discuss any questions you have with your health care provider.  Document Released: 10/11/2000 Document Revised: 09/30/2017 Document Reviewed: 05/20/2016  Elsevier Interactive Patient Education  2019 [employer]

## 2018-11-26 NOTE — Telephone Encounter (Signed)
Preadmission screen Message left with NPO instructions, to take her norvasc as prescribed if she is still taking that as the med rec indicates.  Left arrival time on voicemail as well.

## 2018-11-27 ENCOUNTER — Encounter (HOSPITAL_COMMUNITY): Payer: Self-pay | Admitting: *Deleted

## 2018-11-27 ENCOUNTER — Inpatient Hospital Stay (HOSPITAL_COMMUNITY): Admission: RE | Admit: 2018-11-27 | Payer: Medicaid Other | Source: Ambulatory Visit

## 2018-11-27 ENCOUNTER — Inpatient Hospital Stay (HOSPITAL_COMMUNITY): Payer: Medicaid Other | Admitting: Anesthesiology

## 2018-11-27 ENCOUNTER — Encounter (HOSPITAL_COMMUNITY)
Admission: RE | Disposition: A | Discharge status: Home / Self Care | Payer: Self-pay | Source: Home / Self Care | Attending: Family Medicine

## 2018-11-27 ENCOUNTER — Other Ambulatory Visit: Payer: Self-pay

## 2018-11-27 ENCOUNTER — Inpatient Hospital Stay (HOSPITAL_COMMUNITY)
Admission: RE | Admit: 2018-11-27 | Discharge: 2018-11-30 | DRG: 787 | Disposition: A | Discharge status: Home / Self Care | Payer: Medicaid Other | Attending: Family Medicine | Admitting: Family Medicine

## 2018-11-27 DIAGNOSIS — Z3A36 36 weeks gestation of pregnancy: Secondary | ICD-10-CM | POA: Diagnosis not present

## 2018-11-27 DIAGNOSIS — O24420 Gestational diabetes mellitus in childbirth, diet controlled: Secondary | ICD-10-CM | POA: Diagnosis present

## 2018-11-27 DIAGNOSIS — O1002 Pre-existing essential hypertension complicating childbirth: Secondary | ICD-10-CM | POA: Diagnosis present

## 2018-11-27 DIAGNOSIS — O24419 Gestational diabetes mellitus in pregnancy, unspecified control: Secondary | ICD-10-CM | POA: Diagnosis present

## 2018-11-27 DIAGNOSIS — Z7982 Long term (current) use of aspirin: Secondary | ICD-10-CM | POA: Diagnosis not present

## 2018-11-27 DIAGNOSIS — O280 Abnormal hematological finding on antenatal screening of mother: Secondary | ICD-10-CM | POA: Diagnosis present

## 2018-11-27 DIAGNOSIS — O3413 Maternal care for benign tumor of corpus uteri, third trimester: Secondary | ICD-10-CM | POA: Diagnosis present

## 2018-11-27 DIAGNOSIS — O34211 Maternal care for low transverse scar from previous cesarean delivery: Secondary | ICD-10-CM | POA: Diagnosis present

## 2018-11-27 DIAGNOSIS — O99214 Obesity complicating childbirth: Secondary | ICD-10-CM | POA: Diagnosis present

## 2018-11-27 DIAGNOSIS — I10 Essential (primary) hypertension: Secondary | ICD-10-CM | POA: Diagnosis present

## 2018-11-27 DIAGNOSIS — O365990 Maternal care for other known or suspected poor fetal growth, unspecified trimester, not applicable or unspecified: Secondary | ICD-10-CM | POA: Diagnosis present

## 2018-11-27 DIAGNOSIS — Z98891 History of uterine scar from previous surgery: Secondary | ICD-10-CM

## 2018-11-27 DIAGNOSIS — O365930 Maternal care for other known or suspected poor fetal growth, third trimester, not applicable or unspecified: Secondary | ICD-10-CM

## 2018-11-27 DIAGNOSIS — D259 Leiomyoma of uterus, unspecified: Secondary | ICD-10-CM | POA: Diagnosis present

## 2018-11-27 DIAGNOSIS — Z87891 Personal history of nicotine dependence: Secondary | ICD-10-CM

## 2018-11-27 DIAGNOSIS — O34219 Maternal care for unspecified type scar from previous cesarean delivery: Secondary | ICD-10-CM

## 2018-11-27 LAB — CBC
HCT: 36.9 % (ref 36.0–46.0)
HEMATOCRIT: 39.7 % (ref 36.0–46.0)
Hemoglobin: 12.6 g/dL (ref 12.0–15.0)
Hemoglobin: 11.8 g/dL — ABNORMAL LOW (ref 12.0–15.0)
MCH: 23 pg — ABNORMAL LOW (ref 26.0–34.0)
MCH: 23.2 pg — ABNORMAL LOW (ref 26.0–34.0)
MCHC: 31.7 g/dL (ref 30.0–36.0)
MCHC: 32 g/dL (ref 30.0–36.0)
MCV: 72.3 fL — ABNORMAL LOW (ref 80.0–100.0)
MCV: 72.5 fL — ABNORMAL LOW (ref 80.0–100.0)
PLATELETS: 212 10*3/uL (ref 150–400)
Platelets: 242 10*3/uL (ref 150–400)
RBC: 5.49 MIL/uL — ABNORMAL HIGH (ref 3.87–5.11)
RBC: 5.09 MIL/uL (ref 3.87–5.11)
RDW: 16.2 % — ABNORMAL HIGH (ref 11.5–15.5)
RDW: 16.1 % — ABNORMAL HIGH (ref 11.5–15.5)
WBC: 8.4 10*3/uL (ref 4.0–10.5)
WBC: 10.1 10*3/uL (ref 4.0–10.5)
nRBC: 0 % (ref 0.0–0.2)
nRBC: 0.4 % — ABNORMAL HIGH (ref 0.0–0.2)

## 2018-11-27 LAB — COMPREHENSIVE METABOLIC PANEL
ALT: 11 U/L (ref 0–44)
AST: 15 U/L (ref 15–41)
Albumin: 2.4 g/dL — ABNORMAL LOW (ref 3.5–5.0)
Alkaline Phosphatase: 89 U/L (ref 38–126)
Anion gap: 8 (ref 5–15)
BUN: 8 mg/dL (ref 6–20)
CHLORIDE: 108 mmol/L (ref 98–111)
CO2: 19 mmol/L — AB (ref 22–32)
Calcium: 8.2 mg/dL — ABNORMAL LOW (ref 8.9–10.3)
Creatinine, Ser: 0.68 mg/dL (ref 0.44–1.00)
GFR calc Af Amer: >60 mL/min (ref >60)
GFR calc non Af Amer: >60 mL/min (ref >60)
Glucose, Bld: 88 mg/dL (ref 70–99)
Potassium: 4 mmol/L (ref 3.5–5.1)
Sodium: 135 mmol/L (ref 135–145)
Total Bilirubin: 0.9 mg/dL (ref 0.3–1.2)
Total Protein: 6.1 g/dL — ABNORMAL LOW (ref 6.5–8.1)

## 2018-11-27 LAB — GLUCOSE, CAPILLARY
Glucose-Capillary: 67 mg/dL — ABNORMAL LOW (ref 70–99)
Glucose-Capillary: 97 mg/dL (ref 70–99)

## 2018-11-27 LAB — HEMOGLOBIN A1C
Est. average glucose Bld gHb Est-mCnc: 94 mg/dL
Hgb A1c MFr Bld: 4.9 % (ref 4.8–5.6)

## 2018-11-27 LAB — TYPE AND SCREEN
ABO/RH(D): O POS
Antibody Screen: NEGATIVE

## 2018-11-27 SURGERY — Surgical Case
Anesthesia: Spinal | Site: Abdomen | Wound class: Clean Contaminated

## 2018-11-27 MED ORDER — LACTATED RINGERS IV SOLN
INTRAVENOUS | Status: DC | PRN
  Administered 2018-11-27 (×2): via INTRAVENOUS

## 2018-11-27 MED ORDER — PHENYLEPHRINE 8 MG IN D5W 100 ML (0.08MG/ML) PREMIX OPTIME
INJECTION | INTRAVENOUS | Status: AC
  Filled 2018-11-27: qty 100

## 2018-11-27 MED ORDER — MEPERIDINE HCL 25 MG/ML IJ SOLN: 6.2500 mg | INTRAMUSCULAR | Status: DC | PRN

## 2018-11-27 MED ORDER — AMLODIPINE BESYLATE 10 MG PO TABS
10.0000 mg | ORAL_TABLET | Freq: Every day | ORAL | Status: DC
  Administered 2018-11-27 – 2018-11-30 (×4): 10 mg via ORAL
  Filled 2018-11-27 (×5): qty 1

## 2018-11-27 MED ORDER — BUPIVACAINE HCL (PF) 0.25 % IJ SOLN
INTRAMUSCULAR | Status: DC | PRN
  Administered 2018-11-27: 30 mL

## 2018-11-27 MED ORDER — KETOROLAC TROMETHAMINE 30 MG/ML IJ SOLN
INTRAMUSCULAR | Status: AC
  Administered 2018-11-27: 30 mg via INTRAMUSCULAR
  Filled 2018-11-27: qty 1

## 2018-11-27 MED ORDER — LACTATED RINGERS IV SOLN
INTRAVENOUS | Status: DC
  Administered 2018-11-28: 05:00:00 via INTRAVENOUS

## 2018-11-27 MED ORDER — HYDROMORPHONE HCL 1 MG/ML IJ SOLN
INTRAMUSCULAR | Status: AC
  Filled 2018-11-27: qty 0.5

## 2018-11-27 MED ORDER — BUPIVACAINE HCL (PF) 0.25 % IJ SOLN
INTRAMUSCULAR | Status: AC
  Filled 2018-11-27: qty 30

## 2018-11-27 MED ORDER — LABETALOL HCL 200 MG PO TABS
200.0000 mg | ORAL_TABLET | Freq: Two times a day (BID) | ORAL | Status: DC
  Administered 2018-11-27: 200 mg via ORAL
  Filled 2018-11-27: qty 1

## 2018-11-27 MED ORDER — SODIUM CHLORIDE 0.9 % IR SOLN
Status: DC | PRN
  Administered 2018-11-27: 1000 mL

## 2018-11-27 MED ORDER — CEFAZOLIN SODIUM-DEXTROSE 2-4 GM/100ML-% IV SOLN
2.0000 g | INTRAVENOUS | Status: AC
  Administered 2018-11-27: 2 g via INTRAVENOUS
  Filled 2018-11-27: qty 100

## 2018-11-27 MED ORDER — STERILE WATER FOR IRRIGATION IR SOLN
Status: DC | PRN
  Administered 2018-11-27: 1000 mL

## 2018-11-27 MED ORDER — WITCH HAZEL-GLYCERIN EX PADS: 1.0000 "application " | MEDICATED_PAD | CUTANEOUS | Status: DC | PRN

## 2018-11-27 MED ORDER — KETOROLAC TROMETHAMINE 30 MG/ML IJ SOLN
30.0000 mg | Freq: Four times a day (QID) | INTRAMUSCULAR | Status: AC | PRN
  Administered 2018-11-28: 30 mg via INTRAVENOUS

## 2018-11-27 MED ORDER — FENTANYL CITRATE (PF) 100 MCG/2ML IJ SOLN
INTRAMUSCULAR | Status: AC
  Filled 2018-11-27: qty 2

## 2018-11-27 MED ORDER — GABAPENTIN 100 MG PO CAPS
100.0000 mg | ORAL_CAPSULE | Freq: Every day | ORAL | Status: DC
  Administered 2018-11-27 – 2018-11-29 (×3): 100 mg via ORAL
  Filled 2018-11-27 (×5): qty 1

## 2018-11-27 MED ORDER — COCONUT OIL OIL: 1.0000 "application " | TOPICAL_OIL | Status: DC | PRN

## 2018-11-27 MED ORDER — OXYTOCIN 10 UNIT/ML IJ SOLN
INTRAMUSCULAR | Status: AC
  Filled 2018-11-27: qty 4

## 2018-11-27 MED ORDER — FENTANYL CITRATE (PF) 100 MCG/2ML IJ SOLN
INTRAMUSCULAR | Status: DC | PRN
  Administered 2018-11-27: 15 ug via INTRAVENOUS

## 2018-11-27 MED ORDER — TETANUS-DIPHTH-ACELL PERTUSSIS 5-2.5-18.5 LF-MCG/0.5 IM SUSP: 0.5000 mL | Freq: Once | INTRAMUSCULAR | Status: DC

## 2018-11-27 MED ORDER — SCOPOLAMINE 1 MG/3DAYS TD PT72
MEDICATED_PATCH | TRANSDERMAL | Status: AC
  Filled 2018-11-27: qty 1

## 2018-11-27 MED ORDER — MORPHINE SULFATE (PF) 0.5 MG/ML IJ SOLN
INTRAMUSCULAR | Status: DC | PRN
  Administered 2018-11-27: 150 ug via INTRATHECAL

## 2018-11-27 MED ORDER — KETOROLAC TROMETHAMINE 30 MG/ML IJ SOLN: 30.0000 mg | Freq: Once | INTRAMUSCULAR | Status: DC | PRN

## 2018-11-27 MED ORDER — METOCLOPRAMIDE HCL 5 MG/ML IJ SOLN
INTRAMUSCULAR | Status: AC
  Filled 2018-11-27: qty 4

## 2018-11-27 MED ORDER — HYDROMORPHONE HCL 1 MG/ML IJ SOLN
0.2500 mg | INTRAMUSCULAR | Status: DC | PRN
  Administered 2018-11-27: 0.5 mg via INTRAVENOUS

## 2018-11-27 MED ORDER — LACTATED RINGERS IV SOLN
INTRAVENOUS | Status: DC
  Administered 2018-11-27 (×2): via INTRAVENOUS

## 2018-11-27 MED ORDER — DIBUCAINE 1 % RE OINT: 1.0000 "application " | TOPICAL_OINTMENT | RECTAL | Status: DC | PRN

## 2018-11-27 MED ORDER — KETOROLAC TROMETHAMINE 30 MG/ML IJ SOLN
30.0000 mg | Freq: Four times a day (QID) | INTRAMUSCULAR | Status: AC
  Administered 2018-11-28 (×2): 30 mg via INTRAVENOUS
  Filled 2018-11-27 (×3): qty 1

## 2018-11-27 MED ORDER — BUPIVACAINE IN DEXTROSE 0.75-8.25 % IT SOLN
INTRATHECAL | Status: AC
  Filled 2018-11-27: qty 2

## 2018-11-27 MED ORDER — MORPHINE SULFATE (PF) 0.5 MG/ML IJ SOLN
INTRAMUSCULAR | Status: AC
  Filled 2018-11-27: qty 10

## 2018-11-27 MED ORDER — MENTHOL 3 MG MT LOZG: 1.0000 | LOZENGE | OROMUCOSAL | Status: DC | PRN

## 2018-11-27 MED ORDER — ZOLPIDEM TARTRATE 5 MG PO TABS: 5.0000 mg | ORAL_TABLET | Freq: Every evening | ORAL | Status: DC | PRN

## 2018-11-27 MED ORDER — OXYTOCIN 40 UNITS IN NORMAL SALINE INFUSION - SIMPLE MED: 2.5000 [IU]/h | INTRAVENOUS | Status: AC

## 2018-11-27 MED ORDER — ONDANSETRON HCL 4 MG/2ML IJ SOLN
INTRAMUSCULAR | Status: DC | PRN
  Administered 2018-11-27: 4 mg via INTRAVENOUS

## 2018-11-27 MED ORDER — LIDOCAINE-EPINEPHRINE (PF) 2 %-1:200000 IJ SOLN
INTRAMUSCULAR | Status: DC | PRN
  Administered 2018-11-27: 3 mL via EPIDURAL

## 2018-11-27 MED ORDER — PROMETHAZINE HCL 25 MG/ML IJ SOLN: 6.2500 mg | INTRAMUSCULAR | Status: DC | PRN

## 2018-11-27 MED ORDER — KETOROLAC TROMETHAMINE 30 MG/ML IJ SOLN
30.0000 mg | Freq: Four times a day (QID) | INTRAMUSCULAR | Status: AC | PRN
  Administered 2018-11-27: 30 mg via INTRAMUSCULAR

## 2018-11-27 MED ORDER — BUPIVACAINE IN DEXTROSE 0.75-8.25 % IT SOLN
INTRATHECAL | Status: DC | PRN
  Administered 2018-11-27: 1 mL via INTRATHECAL

## 2018-11-27 MED ORDER — ENOXAPARIN SODIUM 60 MG/0.6ML ~~LOC~~ SOLN
60.0000 mg | SUBCUTANEOUS | Status: DC
  Administered 2018-11-28 – 2018-11-30 (×3): 60 mg via SUBCUTANEOUS
  Filled 2018-11-27 (×4): qty 0.6

## 2018-11-27 MED ORDER — OXYCODONE HCL 5 MG PO TABS
5.0000 mg | ORAL_TABLET | ORAL | Status: DC | PRN
  Administered 2018-11-29 – 2018-11-30 (×2): 5 mg via ORAL
  Filled 2018-11-27 (×2): qty 1

## 2018-11-27 MED ORDER — PRENATAL MULTIVITAMIN CH
1.0000 | ORAL_TABLET | Freq: Every day | ORAL | Status: DC
  Administered 2018-11-28 – 2018-11-30 (×3): 1 via ORAL
  Filled 2018-11-27 (×3): qty 1

## 2018-11-27 MED ORDER — ONDANSETRON HCL 4 MG/2ML IJ SOLN
INTRAMUSCULAR | Status: AC
  Filled 2018-11-27: qty 2

## 2018-11-27 MED ORDER — OXYTOCIN 10 UNIT/ML IJ SOLN
INTRAVENOUS | Status: DC | PRN
  Administered 2018-11-27: 40 [IU] via INTRAVENOUS

## 2018-11-27 MED ORDER — SIMETHICONE 80 MG PO CHEW
80.0000 mg | CHEWABLE_TABLET | ORAL | Status: DC
  Administered 2018-11-28 – 2018-11-29 (×2): 80 mg via ORAL
  Filled 2018-11-27 (×2): qty 1

## 2018-11-27 MED ORDER — DIPHENHYDRAMINE HCL 25 MG PO CAPS
25.0000 mg | ORAL_CAPSULE | Freq: Four times a day (QID) | ORAL | Status: DC | PRN
  Administered 2018-11-27 – 2018-11-28 (×2): 25 mg via ORAL
  Filled 2018-11-27 (×2): qty 1

## 2018-11-27 MED ORDER — SIMETHICONE 80 MG PO CHEW: 80.0000 mg | CHEWABLE_TABLET | ORAL | Status: DC | PRN

## 2018-11-27 MED ORDER — IBUPROFEN 800 MG PO TABS
800.0000 mg | ORAL_TABLET | Freq: Four times a day (QID) | ORAL | Status: DC
  Administered 2018-11-29 – 2018-11-30 (×6): 800 mg via ORAL
  Filled 2018-11-27 (×7): qty 1

## 2018-11-27 MED ORDER — SIMETHICONE 80 MG PO CHEW
80.0000 mg | CHEWABLE_TABLET | Freq: Three times a day (TID) | ORAL | Status: DC
  Administered 2018-11-28 – 2018-11-30 (×6): 80 mg via ORAL
  Filled 2018-11-27 (×7): qty 1

## 2018-11-27 MED ORDER — BUTALBITAL-APAP-CAFFEINE 50-325-40 MG PO TABS
2.0000 | ORAL_TABLET | Freq: Four times a day (QID) | ORAL | Status: DC | PRN
  Administered 2018-11-27: 2 via ORAL
  Filled 2018-11-27: qty 2

## 2018-11-27 MED ORDER — DEXTROSE IN LACTATED RINGERS 5 % IV SOLN
INTRAVENOUS | Status: DC
  Administered 2018-11-27: 16:00:00 via INTRAVENOUS

## 2018-11-27 MED ORDER — LIDOCAINE HCL (PF) 1 % IJ SOLN
INTRAMUSCULAR | Status: AC
  Filled 2018-11-27: qty 5

## 2018-11-27 MED ORDER — PHENYLEPHRINE 8 MG IN D5W 100 ML (0.08MG/ML) PREMIX OPTIME
INJECTION | INTRAVENOUS | Status: DC | PRN
  Administered 2018-11-27: 60 ug/min via INTRAVENOUS

## 2018-11-27 MED ORDER — SENNOSIDES-DOCUSATE SODIUM 8.6-50 MG PO TABS
2.0000 | ORAL_TABLET | ORAL | Status: DC
  Administered 2018-11-28 – 2018-11-29 (×2): 2 via ORAL
  Filled 2018-11-27 (×2): qty 2

## 2018-11-27 SURGICAL SUPPLY — 30 items
BENZOIN TINCTURE PRP APPL 2/3 (GAUZE/BANDAGES/DRESSINGS) ×3 IMPLANT
CHLORAPREP W/TINT 26ML (MISCELLANEOUS) ×3 IMPLANT
CLAMP CORD UMBIL (MISCELLANEOUS) ×3 IMPLANT
CLOSURE WOUND 1/2 X4 (GAUZE/BANDAGES/DRESSINGS) ×1
CLOTH BEACON ORANGE TIMEOUT ST (SAFETY) ×3 IMPLANT
DRSG OPSITE POSTOP 4X10 (GAUZE/BANDAGES/DRESSINGS) ×3 IMPLANT
ELECT REM PT RETURN 9FT ADLT (ELECTROSURGICAL) ×3
ELECTRODE REM PT RTRN 9FT ADLT (ELECTROSURGICAL) ×1 IMPLANT
GAUZE SPONGE 4X4 12PLY STRL (GAUZE/BANDAGES/DRESSINGS) ×6 IMPLANT
GLOVE BIO SURGEON STRL SZ7 (GLOVE) ×3 IMPLANT
GLOVE BIOGEL PI IND STRL 7.0 (GLOVE) ×2 IMPLANT
GLOVE BIOGEL PI INDICATOR 7.0 (GLOVE) ×4
GOWN STRL REUS W/TWL LRG LVL3 (GOWN DISPOSABLE) ×6 IMPLANT
HEMOSTAT ARISTA ABSORB 3G PWDR (HEMOSTASIS) ×3 IMPLANT
NEEDLE HYPO 25X5/8 SAFETYGLIDE (NEEDLE) ×3 IMPLANT
NS IRRIG 1000ML POUR BTL (IV SOLUTION) ×3 IMPLANT
PACK C SECTION WH (CUSTOM PROCEDURE TRAY) ×3 IMPLANT
PAD ABD 8X7 1/2 STERILE (GAUZE/BANDAGES/DRESSINGS) ×3 IMPLANT
PAD OB MATERNITY 4.3X12.25 (PERSONAL CARE ITEMS) ×3 IMPLANT
PENCIL SMOKE EVAC W/HOLSTER (ELECTROSURGICAL) ×3 IMPLANT
RTRCTR C-SECT PINK 25CM LRG (MISCELLANEOUS) ×3 IMPLANT
STRIP CLOSURE SKIN 1/2X4 (GAUZE/BANDAGES/DRESSINGS) ×2 IMPLANT
SUT PLAIN 2 0 XLH (SUTURE) ×3 IMPLANT
SUT VIC AB 0 CTX 36 (SUTURE) ×10
SUT VIC AB 0 CTX36XBRD ANBCTRL (SUTURE) ×5 IMPLANT
SUT VIC AB 4-0 KS 27 (SUTURE) ×3 IMPLANT
TAPE CLOTH SURG 4X10 WHT LF (GAUZE/BANDAGES/DRESSINGS) ×3 IMPLANT
TOWEL OR 17X24 6PK STRL BLUE (TOWEL DISPOSABLE) ×3 IMPLANT
TRAY FOLEY W/BAG SLVR 14FR LF (SET/KITS/TRAYS/PACK) ×3 IMPLANT
WATER STERILE IRR 1000ML POUR (IV SOLUTION) ×3 IMPLANT

## 2018-11-27 NOTE — Anesthesia Procedure Notes (Signed)
Spinal

## 2018-11-27 NOTE — H&P (Deleted)
Subjective:  Sara Walsh is a 33 y.o. G93 P4 female with EDC 11-27-2018 at 89 and 2/[redacted] weeks gestation who is being admitted for C-section.  Her current obstetrical history is significant for, prior c-section.  Patient reports no complaints.   Fetal Movement: normal.     Objective:   Vital signs in last 24 hours: Temp:  [98.4 F (36.9 C)] 98.4 F (36.9 C) (01/31 1017) Pulse Rate:  [91-112] 91 (01/31 1038) Resp:  [20] 20 (01/31 1017) BP: (139-162)/(99-117) 149/99 (01/31 1038) Weight:  [112.4 kg-114.1 kg] 114.1 kg (01/31 1011)   General:   alert, cooperative, appears stated age, no distress and morbidly obese  Skin:   normal  HEENT:  sclera clear, anicteric                 FHT:  137 BPM  Uterine Size: size equals dates  Presentations: unsure  Cervix:    Dilation: N/A   Effacement: N/A   Station:  N/A   Consistency: N/A   Position: N/A   Lab Review  O, Rh+  AFP elevated   One hour GTT: elevated one hour,  normal fasting and two hour.   Assessment/Plan:  36 and 2/[redacted] weeks gestation. Not in labor. Obstetrical history significant for prior c-section.     Risks, benefits, alternatives and possible complications have been discussed in detail with the patient.  Pre-admission, admission, and post admission procedures and expectations were discussed in detail.  All questions answered, all appropriate consents will be signed at the Hospital. Admission is planned for today.  Continue present management.

## 2018-11-27 NOTE — Discharge Summary (Addendum)
Obstetrics Discharge Summary OB/GYN Faculty Practice   Patient Name: Sara Walsh DOB: [birth year] MRN: [id number]  Date of admission: 11/27/2018 Delivering MD: Donnamae Jude   Date of discharge: 11/30/2018  Admitting diagnosis: RCS Undesired Fertility Intrauterine pregnancy: [redacted]w[redacted]d    Secondary diagnosis:   Principal Problem:   Pregnancy affected by fetal growth restriction Active Problems:   Previous cesarean section   Chronic hypertension affecting pregnancy   Elevated AFP   GDM (gestational diabetes mellitus)   Cesarean delivery delivered    Discharge diagnosis: Preterm Delivery by Cesarean Section                                            Postpartum procedures: None Complications: patient had elevated BP up to 164/106 on 11/29/2018. No medication intervention was used at the time and patient's BP returned to 138/92.  Outpatient Follow-Up: '[ ]'  depo injection '[ ]'  [facility]: SCHRISANN MELARAGNOis a 33y.o. 355w2dho was admitted for scheduled repeat LTCS due to IUGR in the setting of poorly controlled HTN. Her pregnancy was complicated by poorly controlled HTN, A1GDM, four prior Cesarean sections, uterine fibroids, elevated AFP. Cesarean delivery was uncomplicated. Please see delivery/op note for additional details. Her postpartum course was complicated by elevated Bps that resolved on their own and with continued Amlodipine. She was breastfeeding without difficulty. By day of discharge, she was passing flatus, urinating, eating and drinking without difficulty. Her pain was well-controlled, and she was discharged home with Ibuprofen, Oxycodone and Sennakot. She will follow-up in clinic in 1 week.   Physical exam  Vitals:   11/29/18 1501 11/29/18 2144 11/29/18 2145 11/30/18 0511  BP: 139/84 (!) 161/105 (!) 164/106 (!) 138/92  Pulse: 92 (!) 119 (!) 119 94  Resp: 16   16  Temp: 98.1 F (36.7 C)   97.8 F (36.6 C)  TempSrc: Oral   Oral   SpO2: 99% 100% (!) 72%   Weight:      Height:      General: In no apparent distress Lochia: appropriate Uterine Fundus: firm Incision: Healing well with no significant drainage, No significant erythema, Dressing is clean, dry, and intact DVT Evaluation: No evidence of DVT seen on physical exam. No significant calf/ankle edema.  Labs: Lab Results  Component Value Date   WBC 9.6 11/28/2018   HGB 10.0 (L) 11/28/2018   HCT 31.6 (L) 11/28/2018   MCV 71.8 (L) 11/28/2018   PLT 196 11/28/2018   CMP Latest Ref Rng & Units 11/28/2018  Glucose 70 - 99 mg/dL -  BUN 6 - 20 mg/dL -  Creatinine 0.44 - 1.00 mg/dL 0.79  Sodium 135 - 145 mmol/L -  Potassium 3.5 - 5.1 mmol/L -  Chloride 98 - 111 mmol/L -  CO2 22 - 32 mmol/L -  Calcium 8.9 - 10.3 mg/dL -  Total Protein 6.5 - 8.1 g/dL -  Total Bilirubin 0.3 - 1.2 mg/dL -  Alkaline Phos 38 - 126 U/L -  AST 15 - 41 U/L -  ALT 0 - 44 U/L -   Discharge instructions: Per After Visit Summary and "Baby and Me Booklet"  After visit meds:  Allergies as of 11/30/2018   No Known Allergies     Medication List    STOP taking these medications   ACCU-CHEK FASTCLIX LANCETS Misc  ACCU-CHEK NANO SMARTVIEW w/Device Kit   glucose blood test strip Commonly known as:  ACCU-CHEK SMARTVIEW   NIFEdipine 30 MG 24 hr tablet Commonly known as:  PROCARDIA-XL/NIFEDICAL-XL     TAKE these medications   Adult Blood Pressure Cuff Lg Kit 1 kit by Does not apply route daily.   amLODipine 10 MG tablet Commonly known as:  NORVASC Take 1 tablet (10 mg total) by mouth daily.   aspirin EC 81 MG tablet Take 1 tablet (81 mg total) by mouth daily. Start taking at [redacted] weeks gestation   ibuprofen 800 MG tablet Commonly known as:  ADVIL,MOTRIN Take 1 tablet (800 mg total) by mouth every 6 (six) hours.   oxyCODONE 5 MG immediate release tablet Commonly known as:  Oxy IR/ROXICODONE Take 1 tablet (5 mg total) by mouth every 6 (six) hours as needed for up to 3 days  for moderate pain.   PREPLUS 27-1 MG Tabs Take 1 tablet by mouth daily.   senna-docusate 8.6-50 MG tablet Commonly known as:  Senokot-S Take 2 tablets by mouth daily. Start taking on:  December 01, 2018       Postpartum contraception: Depo Provera Diet: Routine Diet Activity: Advance as tolerated. Pelvic rest for 6 weeks.   Follow-up Appt: Future Appointments  Date Time Provider [place]  12/03/2018 10:45 AM WH-MFC [place] 5 WH-MFCUS MFC-US  12/10/2018 10:45 AM WH-MFC [place] 2 WH-MFCUS MFC-US  12/11/2018  9:00 AM [place] WOC  12/30/2018  2:15 PM Donnamae Jude, MD WOC-WOCA WOC   Follow-up Visit:No follow-ups on file. Please schedule this patient for Postpartum visit in: 4 weeks with the following provider: Any provider For C/S patients schedule nurse incision check in weeks 2 weeks: yes High risk pregnancy complicated by: cHTN, IUGR, history of 4 prior C/S Delivery mode:  CS Anticipated Birth Control:  depo PP Procedures needed: Incision check, BP check Schedule Integrated BH visit: no  Newborn Data: Live born female  Birth Weight:   APGAR: 75, 9  Newborn Delivery   Birth date/time:  11/27/2018 18:17:00 Delivery type:  C-Section, Low Transverse Trial of labor:  No C-section categorization:  Repeat     Baby Feeding: Breast Disposition:home with mother  Milus Banister, [place], PGY-1 11/30/2018 11:13 AM   OB FELLOW DISCHARGE ATTESTATION  I have seen and examined this patient and agree with above documentation in the resident's note.   Phill Myron, D.O. OB Fellow  11/30/2018, 11:56 AM

## 2018-11-27 NOTE — Anesthesia Procedure Notes (Signed)
Spinal  Patient location during procedure: OR Start time: 11/27/2018 5:25 PM End time: 11/27/2018 5:41 PM Staffing Anesthesiologist: Lyn Hollingshead, MD Performed: anesthesiologist  Preanesthetic Checklist Completed: patient identified, site marked, surgical consent, pre-op evaluation, timeout performed, IV checked, risks and benefits discussed and monitors and equipment checked Spinal Block Patient position: sitting Prep: site prepped and draped and DuraPrep Patient monitoring: continuous pulse ox and blood pressure Approach: midline Location: L3-4 Injection technique: single-shot Needle Needle type: Pencan  Needle gauge: 24 G Needle length: 10 cm Needle insertion depth: 9 cm Catheter type: closed end flexible Catheter size: 19 g Catheter at skin depth: 16 cm Assessment Sensory level: T4

## 2018-11-27 NOTE — Lactation Note (Signed)
This note was copied from a baby's chart. Lactation Consultation Note  Patient Name: Sara Walsh SHFWY'O Date: 11/27/2018 Reason for consult: Initial assessment;Late-preterm 34-36.6wks;Infant < 6lbs   LC requested mom help her to pump.  Flange size 27 used.   Pump had been set up by RN but [place] sat with mom while she pumped and answered questions mom had.  Mom was able to see colostrum drops when she hand expressed prior to pumping.  Mom seemed discouraged when she did not collect any after using the DEBP.  Infant is 4lb 9 oz. And 36wk 2d.  LPTI sheet given to mom as well as lactation brochure with phone number.  LC encouraged mom to try to put baby to the breast and then to supplement her.  LC encouraged her to call out for assistance when she wants to latch infant.   Mom knows to pump/bf at least 8 times in 24 hours to help establish supply and to hand express prior to and after feeding or pumping.  Colostrum containers given to mom to collect.    All questions answered.  BF basics reviewed.  Mom knows to call out for any questions regarding feeding infant.    Maternal Data Has patient been taught Hand Expression?: Yes Does the patient have breastfeeding experience prior to this delivery?: (attempted in hospital with now 34 year old, but switched to formula due to not latching)  Feeding Feeding Type: Formula Nipple Type: Slow - flow  LATCH Score Latch: Too sleepy or reluctant, no latch achieved, no sucking elicited.  Audible Swallowing: None  Type of Nipple: Everted at rest and after stimulation  Comfort (Breast/Nipple): Soft / non-tender  Hold (Positioning): Full assist, staff holds infant at breast  LATCH Score: 4  Interventions Interventions: DEBP;Hand express  Lactation Tools Discussed/Used WIC Program: Yes Pump Review: Setup, frequency, and cleaning;Milk Storage Initiated by:: Marion Downer  Date initiated:: 11/27/18   Consult Status Consult Status:  Follow-up Date: 11/28/18 Follow-up type: In-patient    Ferne Coe [facility] 11/27/2018, 11:05 PM

## 2018-11-27 NOTE — Anesthesia Preprocedure Evaluation (Signed)
Anesthesia Evaluation  Patient identified by MRN, date of birth, ID band Patient awake    Reviewed: Allergy & Precautions, NPO status , Patient's Chart, lab work & pertinent test results  Airway Mallampati: III       Dental no notable dental hx. (+) Teeth Intact   Pulmonary former smoker,    Pulmonary exam normal breath sounds clear to auscultation       Cardiovascular hypertension, Pt. on medications Normal cardiovascular exam Rhythm:Regular Rate:Normal     Neuro/Psych negative psych ROS   GI/Hepatic negative GI ROS, Neg liver ROS,   Endo/Other  diabetesMorbid obesity  Renal/GU negative Renal ROS  negative genitourinary   Musculoskeletal negative musculoskeletal ROS (+)   Abdominal (+) + obese,   Peds  Hematology negative hematology ROS (+)   Anesthesia Other Findings   Reproductive/Obstetrics                             Anesthesia Physical Anesthesia Plan  ASA: III  Anesthesia Plan: Combined Spinal and Epidural   Post-op Pain Management:    Induction:   PONV Risk Score and Plan: 3  Airway Management Planned: Natural Airway and Nasal Cannula  Additional Equipment:   Intra-op Plan:   Post-operative Plan:   Informed Consent: I have reviewed the patients History and Physical, chart, labs and discussed the procedure including the risks, benefits and alternatives for the proposed anesthesia with the patient or authorized representative who has indicated his/her understanding and acceptance.       Plan Discussed with: CRNA  Anesthesia Plan Comments:         Anesthesia Quick Evaluation

## 2018-11-27 NOTE — H&P (Deleted)
Sara Walsh is a 33 y.o. female [phone] at [redacted]w[redacted]d presenting for repeat C-section due to uncontrolled HTN and IUGR. OB History    Gravida  11   Para  4   Term  4   Preterm  0   AB  6   Living  4     SAB  1   TAB  5   Ectopic  0   Multiple  0   Live Births  4          Past Medical History:  Diagnosis Date  . Chlamydia   . Fibroid   . Gestational diabetes   . Hypertension   . Obesity   . Scabies   . Thyroid disease   . Trichomonas infection    Past Surgical History:  Procedure Laterality Date  . CESAREAN SECTION     C/S x 4  . WISDOM TOOTH EXTRACTION     Family History: family history includes Hypertension in her father. Social History:  reports that she quit smoking about 7 years ago. Her smoking use included cigars and cigarettes. She has never used smokeless tobacco. She reports previous drug use. Drug: Marijuana. She reports that she does not drink alcohol.     Maternal Diabetes: Yes:  Diabetes Type:  Diet controlled Genetic Screening: Normal Maternal Ultrasounds/Referrals: Normal Fetal Ultrasounds or other Referrals:  None Maternal Substance Abuse:  Yes:  Type: Smoker, Marijuana Significant Maternal Medications:  None Significant Maternal Lab Results:  None Other Comments:  none  Review of Systems  Constitutional: Negative for chills, diaphoresis, fever, malaise/fatigue and weight loss.  Eyes: Negative for blurred vision and photophobia.  Respiratory: Negative for shortness of breath.        Denies fatigue or lightheaded   Cardiovascular: Negative for chest pain.  Gastrointestinal: Negative for abdominal pain, nausea and vomiting.  Skin: Negative.   Neurological: Negative for dizziness, focal weakness, weakness and headaches.     Blood pressure (!) 145/102, pulse 86, temperature 98.5 F (36.9 C), temperature source Oral, resp. rate 20, height 5\' 4"  (1.626 m), weight 114.1 kg, last menstrual period 03/09/2018, unknown if currently  breastfeeding. Maternal Exam:  Abdomen: Patient reports generalized tenderness.  Fetal presentation: vertex  Introitus: not evaluated.   Cervix: not evaluated.   Physical Exam  Constitutional: She appears well-developed and well-nourished. No distress.  Eyes: Right eye exhibits no discharge. Left eye exhibits no discharge. No scleral icterus.  Cardiovascular: Normal rate, regular rhythm and normal heart sounds.  Respiratory: Effort normal and breath sounds normal. No respiratory distress. She has no wheezes. She has no rales. She exhibits no tenderness.  GI: Soft. There is generalized abdominal tenderness.  Skin: She is not diaphoretic.    Prenatal labs: ABO, Rh: O/Positive/-- (08/01 1616) Antibody: Negative (08/01 1616) Rubella: 2.68 (08/01 1616) RPR: Non Reactive (12/26 0937)  HBsAg: Negative (08/01 1616)  HIV: Non Reactive (12/26 0937)  GBS:     Assessment/Plan: Sara Walsh is a 33 y.o. female [id number] at [redacted]w[redacted]d presenting for repeat C-section due to uncontrolled HTN and IUGR  -Continue pre-opt and pregnancy management      Sara Walsh 11/27/2018, 2:23 PM

## 2018-11-27 NOTE — H&P (Signed)
OBSTETRIC ADMISSION HISTORY AND PHYSICAL  Sara Walsh is a 33 y.o. female [phone] with IUP at 1w2dby L/6 presenting for scheduled repeat Cesarean section for intrauterine growth restriction in the setting of poorly controlled chronic hypertension. Reports headache currently but states no different than prior headaches. Usually in the back of her head. States has been worse since starting blood pressure medication. No improvement with switching BP meds.   Reports fetal movement. Denies vaginal bleeding, leakage of fluids.  [facility]  Support person in labor: no one will be with her during C/S  Ultrasounds . 8w0: dating U/S . 9w1: small subchorionic hemorrhage, elevated FHR 181, uterine fibroids . 12w6: resolution of one of the two subchorionic hemorrhages, large posterior uterine fibroid up to 8.7cm . 18w5: anatomy U/S, posterior placenta, anatomy wnl . 22w5: EFW 484g (39%), normal interval growth and fluid volumes . 26w5: EFW 836g (30%), normal interval growth  . 31w1: EFW 1472g (29%), transverse, appropriate interval growth . 35w5: EFW 1834g (<10%), posterior fundal placenta, cephalic presentation, normal dopplers but growth restriction   Prenatal History/Complications: . History of prior C/S x 4 - initial for failure to progress, reports elevated BP . cHTN - trials of labetalol and procardia, now on norvasc but takes irregularly  . Elevated AFP . A1GDM - not checking BG regularly  . Uterine fibroid - posterior > 8cm on U/S   Past Medical History: Past Medical History:  Diagnosis Date  . Chlamydia   . Fibroid   . Gestational diabetes   . Hypertension   . Obesity   . Scabies   . Thyroid disease   . Trichomonas infection     Past Surgical History: Past Surgical History:  Procedure Laterality Date  . CESAREAN SECTION     C/S x 4  . WISDOM TOOTH EXTRACTION      Obstetrical History: OB History    Gravida  11   Para  4   Term   4   Preterm  0   AB  6   Living  4     SAB  1   TAB  5   Ectopic  0   Multiple  0   Live Births  4           Social History: Social History   Socioeconomic History  . Marital status: Single    Spouse name: Not on file  . Number of children: Not on file  . Years of education: Not on file  . Highest education level: Not on file  Occupational History  . Not on file  Social Needs  . Financial resource strain: Not on file  . Food insecurity:    Worry: Not on file    Inability: Not on file  . Transportation needs:    Medical: Not on file    Non-medical: Not on file  Tobacco Use  . Smoking status: Former Smoker    Types: Cigars, Cigarettes    Last attempt to quit: 09/09/2011    Years since quitting: 7.2  . Smokeless tobacco: Never Used  Substance and Sexual Activity  . Alcohol use: No  . Drug use: Not Currently    Types: Marijuana    Comment: weekends  . Sexual activity: Yes    Birth control/protection: None  Lifestyle  . Physical activity:    Days per week: Not on file    Minutes per session: Not on file  . Stress: Not on file  Relationships  . Social connections:    Talks on phone: Not on file    Gets together: Not on file    Attends religious service: Not on file    Active member of club or organization: Not on file    Attends meetings of clubs or organizations: Not on file    Relationship status: Not on file  Other Topics Concern  . Not on file  Social History Narrative  . Not on file    Family History: Family History  Problem Relation Age of Onset  . Hypertension Father   . Anesthesia problems Neg Hx     Allergies: No Known Allergies  Medications Prior to Admission  Medication Sig Dispense Refill Last Dose  . amLODipine (NORVASC) 10 MG tablet Take 1 tablet (10 mg total) by mouth daily. 30 tablet 1 Taking  . aspirin EC 81 MG tablet Take 1 tablet (81 mg total) by mouth daily. Start taking at [redacted] weeks gestation 30 tablet 11 Taking   . Prenatal Vit-Fe Fumarate-FA (PREPLUS) 27-1 MG TABS Take 1 tablet by mouth daily. 30 tablet 13 Taking  . ACCU-CHEK FASTCLIX LANCETS MISC 1 Units by Percutaneous route 4 (four) times daily. (Patient not taking: Reported on 11/19/2018) 100 each 12 Not Taking  . Blood Glucose Monitoring Suppl (ACCU-CHEK NANO SMARTVIEW) w/Device KIT 1 kit by Subdermal route as directed. Check blood sugars for fasting, and two hours after breakfast, lunch and dinner (4 checks daily) 1 kit 0 Taking  . Blood Pressure Monitoring (ADULT BLOOD PRESSURE CUFF LG) KIT 1 kit by Does not apply route daily. 1 each 0 Taking  . glucose blood (ACCU-CHEK SMARTVIEW) test strip Use as instructed to check blood sugars 100 each 12 Taking  . NIFEdipine (PROCARDIA-XL/NIFEDICAL-XL) 30 MG 24 hr tablet Take 1 tablet (30 mg total) by mouth daily. (Patient not taking: Reported on 11/19/2018) 30 tablet 1 Not Taking     Review of Systems  All systems reviewed and negative except as stated in HPI  Blood pressure (!) 145/102, pulse 86, temperature 98.5 F (36.9 C), temperature source Oral, resp. rate 20, height _0  (1.626 m), weight 114.1 kg, last menstrual period 03/09/2018, unknown if currently breastfeeding. General appearance: tired-appearing, NAD Lungs: no respiratory distress Heart: regular rate  Abdomen: soft, non-tender; gravid  Pelvic: deferred Extremities: trace LE edema Presentation: cephalic by [place] yesterday Fetal monitoring: doppler in 130s    Prenatal labs: ABO, Rh: --/--/O POS (01/31 1245) Antibody: NEG (01/31 1245) Rubella: 2.68 (08/01 1616) RPR: Non Reactive (12/26 0937)  HBsAg: Negative (08/01 1616)  HIV: Non Reactive (12/26 0937)  GBS:   unknown, drawn yesterday Glucola: abnormal, A1GDM (1hr 184) Genetic screening:  AFP screen positive  insufficient fetal DNA for NIPS  Prenatal Transfer Tool  Maternal Diabetes: Yes:  Diabetes Type:  Diet controlled Genetic Screening: inadequate sample for NIPS  AFP screen  positive Maternal Ultrasounds/Referrals: see above, new-onset IUGR Fetal Ultrasounds or other Referrals:  None Maternal Substance Abuse:  No Significant Maternal Medications: PNV, ASA, labetalol, nifedipine, amlodipine Significant Maternal Lab Results: None  Results for orders placed or performed during the hospital encounter of 11/27/18 (from the past 24 hour(s))  CBC   Collection Time: 11/27/18 11:03 AM  Result Value Ref Range   WBC 8.4 4.0 - 10.5 K/uL   RBC 5.49 (H) 3.87 - 5.11 MIL/uL   Hemoglobin 12.6 12.0 - 15.0 g/dL   HCT 39.7 36.0 - 46.0 %   MCV 72.3 (L) 80.0 - 100.0 fL  MCH 23.0 (L) 26.0 - 34.0 pg   MCHC 31.7 30.0 - 36.0 g/dL   RDW 16.2 (H) 11.5 - 15.5 %   Platelets 242 150 - 400 K/uL   nRBC 0.0 0.0 - 0.2 %  Type and screen   Collection Time: 11/27/18 12:45 PM  Result Value Ref Range   ABO/RH(D) O POS    Antibody Screen NEG    Sample Expiration      11/30/2018 Performed at [facility], [address] [place], [place] [address]   Glucose, capillary   Collection Time: 11/27/18  2:49 PM  Result Value Ref Range   Glucose-Capillary 67 (L) 70 - 99 mg/dL   Comment 1 Notify RN     Patient Active Problem List   Diagnosis Date Noted  . Pregnancy affected by fetal growth restriction 11/26/2018  . GDM (gestational diabetes mellitus) 10/26/2018  . Elevated AFP 10/22/2018  . Low fetal fraction 10/22/2018  . Constipation during pregnancy 06/08/2018  . Supervision of high risk pregnancy, antepartum 05/28/2018  . Chronic hypertension affecting pregnancy 05/18/2018  . Previous cesarean section 01/08/2018  . Uterine fibroid 01/08/2018  . Nausea & vomiting 01/08/2018    Assessment/Plan:  Sara Walsh is a 33 y.o. [id number] at 38w2dhere for scheduled repeat Cesarean section because of intrauterine growth restriction in the setting of poorly controlled chronic hypertension. Patient ate large meal this morning so Cesarean section delayed until 5-6pm this evening.    The risks of cesarean section discussed with the patient included but were not limited to: bleeding which may require transfusion or reoperation; infection which may require antibiotics; injury to bowel, bladder, ureters or other surrounding organs; injury to the fetus; need for additional procedures including hysterectomy in the event of a life-threatening hemorrhage; placental abnormalities wth subsequent pregnancies, incisional problems, thromboembolic phenomenon and other postoperative/anesthesia complications. The patient concurred with the proposed plan, giving informed written consent for the procedure.   Patient will remain NPO for procedure. Anesthesia and OR aware. Preoperative prophylactic antibiotics and SCDs ordered on call to the OR.  To OR between 5-6pm.   Postpartum Planning -- breast/depo - declines postpartum BTL -- RI/_0 Tdap/declined flu    Gabrial Domine S. WJuleen China DO OB/GYN Fellow

## 2018-11-27 NOTE — Transfer of Care (Signed)
Immediate Anesthesia Transfer of Care Note  Patient: Sara Walsh  Procedure(s) Performed: CESAREAN SECTION (N/A Abdomen)  Patient Location: PACU  Anesthesia Type:Spinal  Level of Consciousness: awake, alert  and oriented  Airway & Oxygen Therapy: Patient Spontanous Breathing  Post-op Assessment: Report given to RN  Post vital signs: Reviewed and stable  Last Vitals:  Vitals Value Taken Time  BP 142/103 11/27/2018  7:34 PM  Temp    Pulse 60 11/27/2018  7:38 PM  Resp 19 11/27/2018  7:38 PM  SpO2 94 % 11/27/2018  7:38 PM  Vitals shown include unvalidated device data.  Last Pain:  Vitals:   11/27/18 1648  TempSrc: Oral  PainSc:          Complications: No apparent anesthesia complications

## 2018-11-27 NOTE — Lactation Note (Signed)
This note was copied from a baby's chart. Lactation Consultation Note  Patient Name: Sara Walsh [id number] Date: 11/27/2018     [facility] entered room but several visitors were at bedside along with 2 RN's.  LC introduced herself and told mom she would come back by or mom can call out if she's ready sooner.   Maternal Data    Feeding Feeding Type: Breast Fed  LATCH Score Latch: Grasps breast easily, tongue down, lips flanged, rhythmical sucking.  Audible Swallowing: A few with stimulation  Type of Nipple: Everted at rest and after stimulation  Comfort (Breast/Nipple): Soft / non-tender  Hold (Positioning): Full assist, staff holds infant at breast  [facility] Score: 7  Interventions    Lactation Tools Discussed/Used     Consult Status      Ferne Coe [facility] 11/27/2018, 9:57 PM

## 2018-11-27 NOTE — Op Note (Signed)
Preoperative Diagnosis:  Intrauterine pregnancy at [redacted]w[redacted]d  intrauterine growth restriction  poorly controlled chronic HTN  Postoperative Diagnosis:  Same  Procedure: Repeat low transverse cesarean section  Surgeon: Darron Doom, M.D.  Assistant: Gerri Lins, DO  Anesthesia: spinal with Barnet Glasgow, MD  Findings: Viable female infant, APGAR (1 MIN): 9, APGAR (5 MINS): 9    QBL: 414cc per Triton   Complications: None known  Specimens: Placenta to labor and delivery  Reason for procedure: Briefly, the patient is a 33 y.o. [id number] at [redacted]w[redacted]d with history of four prior Cesarean sections who presents for repeat in the setting of IUGR (<10%) and poorly controlled HTN.    Procedure: Patient is to the OR where spinal analgesia was administered. She was then placed in a supine position with left lateral tilt. She received 2 g of Ancef and SCDs were in place. A Foley catheter was placed in the bladder. She was prepped and draped in the usual sterile fashion. A timeout was performed. A knife was then used to make a Pfannenstiel incision. This incision was carried out to underlying fascia which was divided in the midline with the knife. The incision was extended laterally, sharply. Multiple adhesions encountered and dissected down sharply. The fascia was dissected of the underlying rectus superiorly. The rectus was divided in the midline. Two hemostats and Metzenbaum scissors used to enter peritoneum sharply.  Peritoneum taken down using Kelly clamps and tied off with free ties. Alexis retractor was placed inside the incision.  A knife was used to make a low transverse incision on the uterus. This incision was carried down to the amniotic cavity was entered. Bandage scissors were used to extend the left side of the incision superiorly into a J-shaped incision. Fetus was in vertex position and was brought up out of the incision without difficulty. Cord was clamped x 2 and cut. Infant taken to waiting  pediatrician.  Cord blood was obtained. Placenta was delivered from the uterus.  Uterus was cleaned with dry lap pads. Uterine incision closed with 0 Vicryl suture in a locked running fashion. A second imbricating layer was placed in a similar fashion. Hemostasis was confirmed on all surfaces.   Alexis retractor was removed from the abdomen. Peritoneal closure was done with 0 Vicryl suture.  Fascia was closed with 0 Vicryl suture in a running fashion. Subcutaneous closure was performed with 0 Plain suture.  Skin closed using 3-0 Vicryl on a Keith needle. Subcutaneous tissue infused with 30cc 0.25% Marcaine.  Steri strips applied, followed by pressure dressing.  All instrument, needle and lap counts were correct x 2.  Patient was awake and taken to PACU stable.  Lambert Mody. Juleen China, DO OB/GYN Fellow

## 2018-11-28 ENCOUNTER — Encounter (HOSPITAL_COMMUNITY): Payer: Self-pay | Admitting: Family Medicine

## 2018-11-28 LAB — CREATININE, SERUM
Creatinine, Ser: 0.79 mg/dL (ref 0.44–1.00)
GFR calc Af Amer: >60 mL/min (ref >60)
GFR calc non Af Amer: >60 mL/min (ref >60)

## 2018-11-28 LAB — CBC
HCT: 31.6 % — ABNORMAL LOW (ref 36.0–46.0)
Hemoglobin: 10 g/dL — ABNORMAL LOW (ref 12.0–15.0)
MCH: 22.7 pg — ABNORMAL LOW (ref 26.0–34.0)
MCHC: 31.6 g/dL (ref 30.0–36.0)
MCV: 71.8 fL — ABNORMAL LOW (ref 80.0–100.0)
NRBC: 0 % (ref 0.0–0.2)
Platelets: 196 10*3/uL (ref 150–400)
RBC: 4.4 MIL/uL (ref 3.87–5.11)
RDW: 16.1 % — AB (ref 11.5–15.5)
WBC: 9.6 10*3/uL (ref 4.0–10.5)

## 2018-11-28 LAB — STREP GP B NAA: Strep Gp B NAA: POSITIVE — AB

## 2018-11-28 LAB — RPR: RPR Ser Ql: NONREACTIVE

## 2018-11-28 NOTE — Addendum Note (Signed)
Addendum  created 11/28/18 0802 by Hewitt Blade, CRNA   Clinical Note Signed

## 2018-11-28 NOTE — Anesthesia Postprocedure Evaluation (Signed)
Anesthesia Post Note  Patient: Sara Walsh  Procedure(s) Performed: CESAREAN SECTION (N/A Abdomen)     Patient location during evaluation: Mother Baby Anesthesia Type: Spinal Level of consciousness: oriented and awake and alert Pain management: pain level controlled Vital Signs Assessment: post-procedure vital signs reviewed and stable Respiratory status: spontaneous breathing and respiratory function stable Cardiovascular status: blood pressure returned to baseline and stable Postop Assessment: no headache, no backache, no apparent nausea or vomiting and able to ambulate Anesthetic complications: no    Last Vitals:  Vitals:   11/27/18 2228 11/27/18 2317  BP: (!) 150/106 130/90  Pulse: 92 92  Resp: 20 18  Temp: 36.7 C   SpO2: 97% 97%    Last Pain:  Vitals:   11/27/18 2317  TempSrc:   PainSc: 0-No pain   Pain Goal:                   Barnet Glasgow

## 2018-11-28 NOTE — Progress Notes (Signed)
Subjective: Postpartum Day #1: Cesarean Delivery Patient reports tolerating PO. Denies dizziness with ambulation; foley still in place; breastfeeding going slowly; desires depo for contraception; BPs stable overnight  Objective: Vital signs in last 24 hours: Temp:  [98 F (36.7 C)-98.7 F (37.1 C)] 98.5 F (36.9 C) (02/01 0859) Pulse Rate:  [74-112] 102 (02/01 0859) Resp:  [10-24] 18 (02/01 0430) BP: (124-162)/(64-117) 133/90 (02/01 0859) SpO2:  [97 %-100 %] 99 % (02/01 0430) Weight:  [114.1 kg] 114.1 kg (01/31 1011)  Physical Exam:  General: alert, cooperative and no distress Lochia: appropriate Uterine Fundus: firm Incision: pressure dsg intact, dry DVT Evaluation: No evidence of DVT seen on physical exam.  Recent Labs    11/27/18 1946 11/28/18 0718  HGB 11.8* 10.0*  HCT 36.9 31.6*    Assessment/Plan: Status post Cesarean section. Doing well postoperatively.  Continue current care; continue Amlodipine. Anticipate d/c 11/29/18.  Myrtis Ser CNM 11/28/2018, 9:27 AM

## 2018-11-28 NOTE — Anesthesia Postprocedure Evaluation (Signed)
Anesthesia Post Note  Patient: Sara Walsh  Procedure(s) Performed: CESAREAN SECTION (N/A Abdomen)     Patient location during evaluation: Mother Baby Anesthesia Type: Combined Spinal/Epidural Level of consciousness: awake and alert and oriented Pain management: pain level controlled Vital Signs Assessment: post-procedure vital signs reviewed and stable Respiratory status: spontaneous breathing, nonlabored ventilation and respiratory function stable Cardiovascular status: stable Postop Assessment: no headache, no backache, epidural receding, no apparent nausea or vomiting, patient able to bend at knees, able to ambulate and adequate PO intake Anesthetic complications: no    Last Vitals:  Vitals:   11/27/18 2317 11/28/18 0430  BP: 130/90 131/80  Pulse: 92 83  Resp: 18 18  Temp:  36.8 C  SpO2: 97% 99%    Last Pain:  Vitals:   11/28/18 0430  TempSrc: Oral  PainSc: 0-No pain   Pain Goal:                   [employer]

## 2018-11-28 NOTE — Lactation Note (Signed)
This note was copied from a baby's chart. Lactation Consultation Note  Patient Name: Sara Walsh [id number] Date: 11/28/2018 Reason for consult: Follow-up assessment;1st time breastfeeding;Late-preterm 34-36.6wks;Infant < 6lbs  I visited with Sara Walsh today. Baby was STS on her chest upon entry. She states that breast feeding is not going well. When she pumps, she does not see any output, and when baby feeds at the breast, she becomes sleepy very quickly. We discussed normal pumping output in the first three days, and I encouraged Sara Walsh to continue with pump q3 hours for 15 minutes on the initiation setting. I discussed the purpose of pumping and expected output volumes.  We also discussed normal infant feeding patterns in the first 24 hours and LPT behaviors. I encouraged Sara Walsh to continue to put baby to the breast 8-12 times/day on cue and to supplement and pump following. We reviewed the supplementation guidelines for the first 48 hours.   I offered to assist with breast feeding and left my information on the board. She states that she will call for latch assistance when ready. I discussed day 2 feeding patterns, particularly nighttime cluster feeding and recommended that mom rest today.   Sara Walsh does not have a breast pump at home, and she would like a referral to [facility] to discuss eligibility. I discussed pump loan option, and she verbalized understanding.   Maternal Data Formula Feeding for Exclusion: No Does the patient have breastfeeding experience prior to this delivery?: No  Feeding    LATCH Score                   Interventions Interventions: Breast feeding basics reviewed  Lactation Tools Discussed/Used Tools: Pump Breast pump type: Double-Electric Breast Pump WIC Program: Yes Pump Review: Setup, frequency, and cleaning   Consult Status Consult Status: Follow-up Date: 11/29/18 Follow-up type: In-patient    Lenore Manner 11/28/2018, 3:22 PM

## 2018-11-29 NOTE — Progress Notes (Signed)
Post Op Day 2 Subjective: no complaints, up ad lib, voiding, tolerating PO and + flatus  Objective: Blood pressure 131/77, pulse 100, temperature 97.7 F (36.5 C), temperature source Oral, resp. rate 16, height 5\' 4"  (1.626 m), weight 114.1 kg, last menstrual period 03/09/2018, SpO2 100 %, unknown if currently breastfeeding.  Physical Exam:  General: alert, cooperative, appears stated age and no distress Lochia: appropriate Uterine Fundus: firm Incision: healing well, no significant drainage, no dehiscence, no significant erythema DVT Evaluation: No evidence of DVT seen on physical exam.  Recent Labs    11/27/18 1946 11/28/18 0718  HGB 11.8* 10.0*  HCT 36.9 31.6*    Assessment/Plan: Plan for discharge tomorrow and Breastfeeding  BP 130s/80s: continue norvasc 10mg  daily   LOS: 2 days   Aura Camps 11/29/2018, 9:02 AM

## 2018-11-29 NOTE — Lactation Note (Signed)
This note was copied from a baby's chart. Lactation Consultation Note  Patient Name: Sara Walsh [id number] Date: 11/29/2018   Baby 51 hours old.  < 5 lbs. Mother states baby falls asleep or gets frustrated at the breast so she has bottle formula feeding. Reviewed normal LPI feeding behavior. Mother states she has not been pumping since "nothing comes out".  Provided education on how breastmilk comes to volume. Feed on demand approximately 8-12 times per day at least q 3 hours.   Encouraged mother to pump q 3 hours.  Unsure of mother's commitment to provide baby with breastmilk.  Follow up with mother PRN.     Maternal Data    Feeding Feeding Type: Bottle Fed - Formula  LATCH Score                   Interventions    Lactation Tools Discussed/Used     Consult Status      Carlye Grippe 11/29/2018, 3:05 PM

## 2018-11-30 ENCOUNTER — Encounter (HOSPITAL_COMMUNITY): Payer: Self-pay

## 2018-11-30 LAB — GLUCOSE, CAPILLARY: Glucose-Capillary: 86 mg/dL (ref 70–99)

## 2018-11-30 MED ORDER — SENNOSIDES-DOCUSATE SODIUM 8.6-50 MG PO TABS: 2.0000 | ORAL_TABLET | ORAL | 0 refills | Status: DC

## 2018-11-30 MED ORDER — NALBUPHINE HCL 10 MG/ML IJ SOLN: 5.0000 mg | INTRAMUSCULAR | Status: DC | PRN

## 2018-11-30 MED ORDER — IBUPROFEN 800 MG PO TABS: 800.0000 mg | ORAL_TABLET | Freq: Four times a day (QID) | ORAL | 0 refills | Status: DC

## 2018-11-30 MED ORDER — SODIUM CHLORIDE 0.9% FLUSH: 3.0000 mL | INTRAVENOUS | Status: DC | PRN

## 2018-11-30 MED ORDER — NALBUPHINE HCL 10 MG/ML IJ SOLN: 5.0000 mg | Freq: Once | INTRAMUSCULAR | Status: DC | PRN

## 2018-11-30 MED ORDER — DIPHENHYDRAMINE HCL 25 MG PO CAPS: 25.0000 mg | ORAL_CAPSULE | ORAL | Status: DC | PRN

## 2018-11-30 MED ORDER — DIPHENHYDRAMINE HCL 50 MG/ML IJ SOLN: 12.5000 mg | INTRAMUSCULAR | Status: DC | PRN

## 2018-11-30 MED ORDER — NALOXONE HCL 4 MG/10ML IJ SOLN
1.0000 ug/kg/h | INTRAVENOUS | Status: DC | PRN
  Filled 2018-11-30: qty 5

## 2018-11-30 MED ORDER — SCOPOLAMINE 1 MG/3DAYS TD PT72
1.0000 | MEDICATED_PATCH | Freq: Once | TRANSDERMAL | Status: DC
  Filled 2018-11-30: qty 1

## 2018-11-30 MED ORDER — ONDANSETRON HCL 4 MG/2ML IJ SOLN: 4.0000 mg | Freq: Three times a day (TID) | INTRAMUSCULAR | Status: DC | PRN

## 2018-11-30 MED ORDER — ACETAMINOPHEN 500 MG PO TABS
1000.0000 mg | ORAL_TABLET | Freq: Four times a day (QID) | ORAL | Status: DC
  Administered 2018-11-30: 1000 mg via ORAL
  Filled 2018-11-30: qty 2

## 2018-11-30 MED ORDER — OXYCODONE HCL 5 MG PO TABS: 5.0000 mg | ORAL_TABLET | Freq: Four times a day (QID) | ORAL | 0 refills | Status: AC | PRN

## 2018-11-30 MED ORDER — NALOXONE HCL 0.4 MG/ML IJ SOLN: 0.4000 mg | INTRAMUSCULAR | Status: DC | PRN

## 2018-11-30 NOTE — Lactation Note (Signed)
This note was copied from a baby's chart. Lactation Consultation Note  Patient Name: Sara Walsh [id number] Date: 11/30/2018   Baby 7 hours old.  [redacted]w[redacted]d.   Mother has primarily bottle formula feeding due to sleepiness at the breast. Mother has not been pumping although encouraged to do so. Her breasts are tender.  Recommend she pump w/ DEBP. Mother has manual pump and WIC referral has been sent and given to [facility]. Encouraged mother to increase volume of formula for baby. Reviewed engorgement care and monitoring voids/stools.      Maternal Data    Feeding    LATCH Score                   Interventions    Lactation Tools Discussed/Used     Consult Status      Carlye Grippe 11/30/2018, 9:32 AM

## 2018-12-01 ENCOUNTER — Ambulatory Visit: Payer: Self-pay

## 2018-12-01 NOTE — Progress Notes (Signed)
CSW acknowledges consult and followed up MOB's RN regarding MOB's needs. Per MOB's RN, MOB had concerns about getting the correct formula from [facility] and baby clothes. RN reported that she spoke with the nursery that will ensure that infant receives the appropriate formula from [facility] and also provided MOB with baby bundle. RN reported that MOB has no more concerns regarding supplies nor needs. CSW signing off, please re-consult if new needs arise.  Abundio Miu, [place] Worker [facility] Cell#: [phone]

## 2018-12-01 NOTE — Lactation Note (Signed)
This note was copied from a baby's chart. Lactation Consultation Note; Mom reports she pumped 3 times yesterday. Reports breasts are feeling fuller this morning. Is getting ready to pump now. Breasts feel soft and nipples intact at present. Encouraged to pump 8 times/24 hours to promote milk supply for baby. Has been bottle feeding formula. Has pump from [facility] for home. No questions at present. To call prn  Patient Name: Sara Walsh [id number] Date: 12/01/2018 Reason for consult: Follow-up assessment;Late-preterm 34-36.6wks   Maternal Data Formula Feeding for Exclusion: No Does the patient have breastfeeding experience prior to this delivery?: No  Feeding Feeding Type: Bottle Fed - Formula Nipple Type: Dr. Clement Husbands  LATCH Score                   Interventions    Lactation Tools Discussed/Used [facility] Program: Yes   Consult Status Consult Status: Complete    Truddie Crumble 12/01/2018, 10:16 AM

## 2018-12-03 ENCOUNTER — Encounter: Payer: Medicaid Other | Admitting: Obstetrics and Gynecology

## 2018-12-03 ENCOUNTER — Ambulatory Visit (HOSPITAL_COMMUNITY): Payer: Medicaid Other

## 2018-12-03 ENCOUNTER — Other Ambulatory Visit: Payer: Medicaid Other

## 2018-12-06 LAB — GLUCOSE, CAPILLARY: Glucose-Capillary: 133 mg/dL — ABNORMAL HIGH (ref 70–99)

## 2018-12-10 ENCOUNTER — Encounter: Payer: Medicaid Other | Admitting: Obstetrics and Gynecology

## 2018-12-10 ENCOUNTER — Other Ambulatory Visit: Payer: Medicaid Other

## 2018-12-10 ENCOUNTER — Other Ambulatory Visit (HOSPITAL_COMMUNITY): Payer: Medicaid Other

## 2018-12-11 ENCOUNTER — Ambulatory Visit: Payer: Medicaid Other

## 2018-12-30 ENCOUNTER — Ambulatory Visit (INDEPENDENT_AMBULATORY_CARE_PROVIDER_SITE_OTHER): Payer: Medicaid Other | Admitting: Family Medicine

## 2018-12-30 ENCOUNTER — Encounter: Payer: Self-pay | Admitting: Family Medicine

## 2018-12-30 VITALS — BP 125/90 | HR 95 | Wt 236.4 lb

## 2018-12-30 DIAGNOSIS — Z8632 Personal history of gestational diabetes: Secondary | ICD-10-CM

## 2018-12-30 DIAGNOSIS — Z3042 Encounter for surveillance of injectable contraceptive: Secondary | ICD-10-CM

## 2018-12-30 DIAGNOSIS — Z1389 Encounter for screening for other disorder: Secondary | ICD-10-CM | POA: Diagnosis not present

## 2018-12-30 DIAGNOSIS — L700 Acne vulgaris: Secondary | ICD-10-CM

## 2018-12-30 DIAGNOSIS — I10 Essential (primary) hypertension: Secondary | ICD-10-CM

## 2018-12-30 DIAGNOSIS — K5901 Slow transit constipation: Secondary | ICD-10-CM

## 2018-12-30 HISTORY — DX: Personal history of gestational diabetes: Z86.32

## 2018-12-30 LAB — POCT PREGNANCY, URINE: Preg Test, Ur: NEGATIVE

## 2018-12-30 MED ORDER — METRONIDAZOLE 1 % EX GEL: Freq: Every day | CUTANEOUS | 0 refills | Status: DC

## 2018-12-30 MED ORDER — POLYETHYLENE GLYCOL 3350 17 G PO PACK: 17.0000 g | PACK | Freq: Every day | ORAL | 0 refills | Status: DC

## 2018-12-30 MED ORDER — MEDROXYPROGESTERONE ACETATE 150 MG/ML IM SUSP
150.0000 mg | Freq: Once | INTRAMUSCULAR | Status: AC
  Administered 2018-12-30: 150 mg via INTRAMUSCULAR

## 2018-12-30 NOTE — Progress Notes (Signed)
Subjective:     Sara Walsh is a 33 y.o. female who presents for a postpartum visit. She is 4 weeks postpartum following a low cervical transverse Cesarean section. I have fully reviewed the prenatal and intrapartum course. The delivery was at 100 gestational weeks. Outcome: repeat cesarean section, low transverse incision. Anesthesia: spinal. Postpartum course has been normal. Baby's course has been normal. Baby is feeding by bottle - Similac Neosure. Bleeding no bleeding. Bowel function is abnormal: constipation. Bladder function is normal. Patient is not sexually active. Contraception method is Depo-Provera injections. Postpartum depression screening: negative. I have reviewed and independently verified the above information. Reports acne, with breakout recently. Also with constipation. On Amlodipine and BP ok today. Has h/o GDM.  The following portions of the patient's history were reviewed and updated as appropriate: allergies, current medications, past family history, past medical history, past social history, past surgical history and problem list.  Review of Systems Pertinent items noted in HPI and remainder of comprehensive ROS otherwise negative.   Objective:    BP 125/90   Pulse 95   Wt 236 lb 6.4 oz (107.2 kg)   LMP 03/09/2018 (Approximate)   BMI 40.58 kg/m   General:  alert, cooperative and appears stated age  Lungs: normal effort  Heart:  regular rate and rhythm  Abdomen: soft, non-tender; bowel sounds normal; no masses,  no organomegaly and incision is well healed  UPT negative      Assessment:     Nml postpartum exam. Pap smear not done at today's visit.   Plan:    1. Contraception: Depo-Provera injections 2. Pap due 05/2021 3. Follow up in: 3 months for Depo, CPE in 6 months or as needed.   4. Stop Amlodipine and check BP with 2 hour--will need to be fasting--in 1-2 wks. If BP ok, may continue to stop her Amlodipine, if elevated will need to resume this 5.  Metrogel topical for acne 6. Miralax for constiptation.

## 2018-12-30 NOTE — Patient Instructions (Signed)
DASH Eating Plan  DASH stands for "Dietary Approaches to Stop Hypertension." The DASH eating plan is a healthy eating plan that has been shown to reduce high blood pressure (hypertension). It may also reduce your risk for type 2 diabetes, heart disease, and stroke. The DASH eating plan may also help with weight loss.  What are tips for following this plan?    General guidelines   Avoid eating more than 2,300 mg (milligrams) of salt (sodium) a day. If you have hypertension, you may need to reduce your sodium intake to 1,500 mg a day.   Limit alcohol intake to no more than 1 drink a day for nonpregnant women and 2 drinks a day for men. One drink equals 12 oz of beer, 5 oz of wine, or 1 oz of hard liquor.   Work with your health care provider to maintain a healthy body weight or to lose weight. Ask what an ideal weight is for you.   Get at least 30 minutes of exercise that causes your heart to beat faster (aerobic exercise) most days of the week. Activities may include walking, swimming, or biking.   Work with your health care provider or diet and nutrition specialist (dietitian) to adjust your eating plan to your individual calorie needs.  Reading food labels     Check food labels for the amount of sodium per serving. Choose foods with less than 5 percent of the Daily Value of sodium. Generally, foods with less than 300 mg of sodium per serving fit into this eating plan.   To find whole grains, look for the word "whole" as the first word in the ingredient list.  Shopping   Buy products labeled as "low-sodium" or "no salt added."   Buy fresh foods. Avoid canned foods and premade or frozen meals.  Cooking   Avoid adding salt when cooking. Use salt-free seasonings or herbs instead of table salt or sea salt. Check with your health care provider or pharmacist before using salt substitutes.   Do not fry foods. Cook foods using healthy methods such as baking, boiling, grilling, and broiling instead.   Cook with  heart-healthy oils, such as olive, canola, soybean, or sunflower oil.  Meal planning   Eat a balanced diet that includes:  ? 5 or more servings of fruits and vegetables each day. At each meal, try to fill half of your plate with fruits and vegetables.  ? Up to 6-8 servings of whole grains each day.  ? Less than 6 oz of lean meat, poultry, or fish each day. A 3-oz serving of meat is about the same size as a deck of cards. One egg equals 1 oz.  ? 2 servings of low-fat dairy each day.  ? A serving of nuts, seeds, or beans 5 times each week.  ? Heart-healthy fats. Healthy fats called Omega-3 fatty acids are found in foods such as flaxseeds and coldwater fish, like sardines, salmon, and mackerel.   Limit how much you eat of the following:  ? Canned or prepackaged foods.  ? Food that is high in trans fat, such as fried foods.  ? Food that is high in saturated fat, such as fatty meat.  ? Sweets, desserts, sugary drinks, and other foods with added sugar.  ? Full-fat dairy products.   Do not salt foods before eating.   Try to eat at least 2 vegetarian meals each week.   Eat more home-cooked food and less restaurant, buffet, and fast food.     When eating at a restaurant, ask that your food be prepared with less salt or no salt, if possible.  What foods are recommended?  The items listed may not be a complete list. Talk with your dietitian about what dietary choices are best for you.  Grains  Whole-grain or whole-wheat bread. Whole-grain or whole-wheat pasta. Brown rice. Oatmeal. Quinoa. Bulgur. Whole-grain and low-sodium cereals. Pita bread. Low-fat, low-sodium crackers. Whole-wheat flour tortillas.  Vegetables  Fresh or frozen vegetables (raw, steamed, roasted, or grilled). Low-sodium or reduced-sodium tomato and vegetable juice. Low-sodium or reduced-sodium tomato sauce and tomato paste. Low-sodium or reduced-sodium canned vegetables.  Fruits  All fresh, dried, or frozen fruit. Canned fruit in natural juice (without  added sugar).  Meat and other protein foods  Skinless chicken or turkey. Ground chicken or turkey. Pork with fat trimmed off. Fish and seafood. Egg whites. Dried beans, peas, or lentils. Unsalted nuts, nut butters, and seeds. Unsalted canned beans. Lean cuts of beef with fat trimmed off. Low-sodium, lean deli meat.  Dairy  Low-fat (1%) or fat-free (skim) milk. Fat-free, low-fat, or reduced-fat cheeses. Nonfat, low-sodium ricotta or cottage cheese. Low-fat or nonfat yogurt. Low-fat, low-sodium cheese.  Fats and oils  Soft margarine without trans fats. Vegetable oil. Low-fat, reduced-fat, or light mayonnaise and salad dressings (reduced-sodium). Canola, safflower, olive, soybean, and sunflower oils. Avocado.  Seasoning and other foods  Herbs. Spices. Seasoning mixes without salt. Unsalted popcorn and pretzels. Fat-free sweets.  What foods are not recommended?  The items listed may not be a complete list. Talk with your dietitian about what dietary choices are best for you.  Grains  Baked goods made with fat, such as croissants, muffins, or some breads. Dry pasta or rice meal packs.  Vegetables  Creamed or fried vegetables. Vegetables in a cheese sauce. Regular canned vegetables (not low-sodium or reduced-sodium). Regular canned tomato sauce and paste (not low-sodium or reduced-sodium). Regular tomato and vegetable juice (not low-sodium or reduced-sodium). Pickles. Olives.  Fruits  Canned fruit in a light or heavy syrup. Fried fruit. Fruit in cream or butter sauce.  Meat and other protein foods  Fatty cuts of meat. Ribs. Fried meat. Bacon. Sausage. Bologna and other processed lunch meats. Salami. Fatback. Hotdogs. Bratwurst. Salted nuts and seeds. Canned beans with added salt. Canned or smoked fish. Whole eggs or egg yolks. Chicken or turkey with skin.  Dairy  Whole or 2% milk, cream, and half-and-half. Whole or full-fat cream cheese. Whole-fat or sweetened yogurt. Full-fat cheese. Nondairy creamers. Whipped toppings.  Processed cheese and cheese spreads.  Fats and oils  Butter. Stick margarine. Lard. Shortening. Ghee. Bacon fat. Tropical oils, such as coconut, palm kernel, or palm oil.  Seasoning and other foods  Salted popcorn and pretzels. Onion salt, garlic salt, seasoned salt, table salt, and sea salt. Worcestershire sauce. Tartar sauce. Barbecue sauce. Teriyaki sauce. Soy sauce, including reduced-sodium. Steak sauce. Canned and packaged gravies. Fish sauce. Oyster sauce. Cocktail sauce. Horseradish that you find on the shelf. Ketchup. Mustard. Meat flavorings and tenderizers. Bouillon cubes. Hot sauce and Tabasco sauce. Premade or packaged marinades. Premade or packaged taco seasonings. Relishes. Regular salad dressings.  Where to find more information:   National Heart, Lung, and Blood Institute: www.nhlbi.nih.gov   American Heart Association: www.heart.org  Summary   The DASH eating plan is a healthy eating plan that has been shown to reduce high blood pressure (hypertension). It may also reduce your risk for type 2 diabetes, heart disease, and stroke.   With the   DASH eating plan, you should limit salt (sodium) intake to 2,300 mg a day. If you have hypertension, you may need to reduce your sodium intake to 1,500 mg a day.   When on the DASH eating plan, aim to eat more fresh fruits and vegetables, whole grains, lean proteins, low-fat dairy, and heart-healthy fats.   Work with your health care provider or diet and nutrition specialist (dietitian) to adjust your eating plan to your individual calorie needs.  This information is not intended to replace advice given to you by your health care provider. Make sure you discuss any questions you have with your health care provider.  Document Released: 10/03/2011 Document Revised: 10/07/2016 Document Reviewed: 10/07/2016  Elsevier Interactive Patient Education  2019 Elsevier Inc.

## 2019-01-12 ENCOUNTER — Other Ambulatory Visit: Payer: Self-pay | Admitting: *Deleted

## 2019-01-12 DIAGNOSIS — O0990 Supervision of high risk pregnancy, unspecified, unspecified trimester: Secondary | ICD-10-CM

## 2019-01-12 DIAGNOSIS — O24439 Gestational diabetes mellitus in the puerperium, unspecified control: Secondary | ICD-10-CM

## 2019-01-13 ENCOUNTER — Other Ambulatory Visit: Payer: Medicaid Other

## 2019-02-23 ENCOUNTER — Telehealth: Payer: Self-pay | Admitting: Student

## 2019-02-23 DIAGNOSIS — D259 Leiomyoma of uterus, unspecified: Secondary | ICD-10-CM

## 2019-02-23 DIAGNOSIS — N939 Abnormal uterine and vaginal bleeding, unspecified: Secondary | ICD-10-CM

## 2019-02-23 NOTE — Telephone Encounter (Signed)
Opened in error

## 2019-02-24 ENCOUNTER — Telehealth: Payer: Self-pay | Admitting: Family Medicine

## 2019-02-24 NOTE — Telephone Encounter (Signed)
Patient state she called the other day requesting to speak with a nurse regarding her Bleeding issues

## 2019-02-24 NOTE — Telephone Encounter (Signed)
Called pt and pt informed me that she has been bleeding since January.  Per chart review pt received Depo Provera in March.  I explained to the pt that it does take at least two injections before she sees some regularity in her period.  Pt stated that she wanted something else prescribed to be able to help with bleeding.  Message routed to Dr. Kennon Rounds for recommendation.

## 2019-02-26 MED ORDER — DICLOFENAC SODIUM 75 MG PO TBEC: 75.0000 mg | DELAYED_RELEASE_TABLET | Freq: Two times a day (BID) | ORAL | 0 refills | Status: DC

## 2019-02-26 NOTE — Addendum Note (Signed)
Addended byGeralyn Flash L on: 02/26/2019 11:15 AM   Modules accepted: Orders

## 2019-02-26 NOTE — Telephone Encounter (Signed)
I called Sara Walsh and informed her Dr. Kennon Rounds would like to try a trial of diclofenac for her bleeding and will help with pain. Verified she is bottlefeeding and that she does not have ulcers. She agreed with plan and I instructed her to let us know if it does not help. She voices understanding.

## 2019-03-02 ENCOUNTER — Telehealth: Payer: Self-pay | Admitting: General Practice

## 2019-03-02 DIAGNOSIS — D259 Leiomyoma of uterus, unspecified: Secondary | ICD-10-CM

## 2019-03-02 DIAGNOSIS — N939 Abnormal uterine and vaginal bleeding, unspecified: Secondary | ICD-10-CM

## 2019-03-02 MED ORDER — DICLOFENAC SODIUM 75 MG PO TBEC: 75.0000 mg | DELAYED_RELEASE_TABLET | Freq: Two times a day (BID) | ORAL | 0 refills | Status: DC

## 2019-03-02 NOTE — Telephone Encounter (Signed)
Patient called into front office speaking with a registrar Lorriane Shire) stating she is unable to get her Rx at the pharmacy and the pharmacy states they need to talk to [place]. Called [facility] who states voltaren is not covered by medicaid requires a PA. Patient informed by Lorriane Shire with good rx coupon she can receive the medication for around $11 with coupon at [facility]. Patient verbalized understanding & requests med be sent to [facility]. Med ordered. Patient had no questions.

## 2019-03-17 ENCOUNTER — Ambulatory Visit: Payer: Medicaid Other

## 2019-03-26 ENCOUNTER — Emergency Department (HOSPITAL_COMMUNITY)
Admission: EM | Admit: 2019-03-26 | Discharge: 2019-03-26 | Disposition: A | Discharge status: Home / Self Care | Payer: Medicaid Other | Attending: Emergency Medicine | Admitting: Emergency Medicine

## 2019-03-26 ENCOUNTER — Other Ambulatory Visit: Payer: Self-pay

## 2019-03-26 ENCOUNTER — Encounter (HOSPITAL_COMMUNITY): Payer: Self-pay | Admitting: Emergency Medicine

## 2019-03-26 DIAGNOSIS — M62838 Other muscle spasm: Secondary | ICD-10-CM | POA: Diagnosis not present

## 2019-03-26 DIAGNOSIS — Y92410 Unspecified street and highway as the place of occurrence of the external cause: Secondary | ICD-10-CM | POA: Diagnosis not present

## 2019-03-26 DIAGNOSIS — S161XXA Strain of muscle, fascia and tendon at neck level, initial encounter: Secondary | ICD-10-CM | POA: Diagnosis not present

## 2019-03-26 DIAGNOSIS — Z79899 Other long term (current) drug therapy: Secondary | ICD-10-CM | POA: Diagnosis not present

## 2019-03-26 DIAGNOSIS — Z87891 Personal history of nicotine dependence: Secondary | ICD-10-CM | POA: Insufficient documentation

## 2019-03-26 DIAGNOSIS — I10 Essential (primary) hypertension: Secondary | ICD-10-CM | POA: Diagnosis not present

## 2019-03-26 DIAGNOSIS — S199XXA Unspecified injury of neck, initial encounter: Secondary | ICD-10-CM | POA: Diagnosis present

## 2019-03-26 DIAGNOSIS — Y999 Unspecified external cause status: Secondary | ICD-10-CM | POA: Diagnosis not present

## 2019-03-26 DIAGNOSIS — Y9389 Activity, other specified: Secondary | ICD-10-CM | POA: Insufficient documentation

## 2019-03-26 MED ORDER — CYCLOBENZAPRINE HCL 10 MG PO TABS: 10.0000 mg | ORAL_TABLET | Freq: Two times a day (BID) | ORAL | 0 refills | Status: DC | PRN
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
## 2019-03-30 ENCOUNTER — Other Ambulatory Visit: Payer: Self-pay

## 2019-03-30 ENCOUNTER — Ambulatory Visit (INDEPENDENT_AMBULATORY_CARE_PROVIDER_SITE_OTHER): Payer: Medicaid Other

## 2019-03-30 DIAGNOSIS — Z3042 Encounter for surveillance of injectable contraceptive: Secondary | ICD-10-CM | POA: Diagnosis not present

## 2019-03-30 MED ORDER — MEDROXYPROGESTERONE ACETATE 150 MG/ML IM SUSP
150.0000 mg | Freq: Once | INTRAMUSCULAR | Status: AC
  Administered 2019-03-30: 150 mg via INTRAMUSCULAR

## 2019-03-30 NOTE — Progress Notes (Signed)
Sara Walsh here for Depo Provera   Injection.  Injection administered without complication. Patient will return in 3 months for next injection.  Pt reports that is has been having bleeding since her delivery in January per chart review pt was prescribed Diclofenac for bleeding and pain which is not effective.  I explained to the pt that I will need to speak with a provider and that I will call her once I am able to speak with a provider.  Pt agreed.    Notified Dr. Rosana Hoes who recommended that the pt keep taking the Diclofenac po bid for another week to see if helps with her bleeding and if it doesn't to please call the office.  Verdell Carmine, RN 03/30/2019  10:00 AM

## 2019-05-18 ENCOUNTER — Encounter (HOSPITAL_COMMUNITY): Payer: Self-pay | Admitting: *Deleted

## 2019-05-18 ENCOUNTER — Other Ambulatory Visit: Payer: Self-pay

## 2019-05-18 ENCOUNTER — Emergency Department (HOSPITAL_COMMUNITY)
Admission: EM | Admit: 2019-05-18 | Discharge: 2019-05-18 | Disposition: A | Discharge status: Home / Self Care | Payer: Medicaid Other | Attending: Emergency Medicine | Admitting: Emergency Medicine

## 2019-05-18 DIAGNOSIS — Z79899 Other long term (current) drug therapy: Secondary | ICD-10-CM | POA: Diagnosis not present

## 2019-05-18 DIAGNOSIS — R110 Nausea: Secondary | ICD-10-CM | POA: Insufficient documentation

## 2019-05-18 DIAGNOSIS — R42 Dizziness and giddiness: Secondary | ICD-10-CM | POA: Diagnosis not present

## 2019-05-18 DIAGNOSIS — I10 Essential (primary) hypertension: Secondary | ICD-10-CM | POA: Diagnosis not present

## 2019-05-18 DIAGNOSIS — E876 Hypokalemia: Secondary | ICD-10-CM

## 2019-05-18 DIAGNOSIS — Z87891 Personal history of nicotine dependence: Secondary | ICD-10-CM | POA: Diagnosis not present

## 2019-05-18 LAB — URINALYSIS, ROUTINE W REFLEX MICROSCOPIC
Bilirubin Urine: NEGATIVE
Glucose, UA: NEGATIVE mg/dL
Hgb urine dipstick: NEGATIVE
Ketones, ur: NEGATIVE mg/dL
Leukocytes,Ua: NEGATIVE
Nitrite: NEGATIVE
Protein, ur: 30 mg/dL — AB
Specific Gravity, Urine: 1.028 (ref 1.005–1.030)
pH: 5 (ref 5.0–8.0)

## 2019-05-18 LAB — CBC
HCT: 42.5 % (ref 36.0–46.0)
Hemoglobin: 13.3 g/dL (ref 12.0–15.0)
MCH: 22.7 pg — ABNORMAL LOW (ref 26.0–34.0)
MCHC: 31.3 g/dL (ref 30.0–36.0)
MCV: 72.4 fL — ABNORMAL LOW (ref 80.0–100.0)
Platelets: 349 10*3/uL (ref 150–400)
RBC: 5.87 MIL/uL — ABNORMAL HIGH (ref 3.87–5.11)
RDW: 17.7 % — ABNORMAL HIGH (ref 11.5–15.5)
WBC: 7.8 10*3/uL (ref 4.0–10.5)
nRBC: 0 % (ref 0.0–0.2)

## 2019-05-18 LAB — HEPATIC FUNCTION PANEL
ALT: 17 U/L (ref 0–44)
AST: 16 U/L (ref 15–41)
Albumin: 3.8 g/dL (ref 3.5–5.0)
Alkaline Phosphatase: 77 U/L (ref 38–126)
Bilirubin, Direct: 0.1 mg/dL (ref 0.0–0.2)
Indirect Bilirubin: 0.5 mg/dL (ref 0.3–0.9)
Total Bilirubin: 0.6 mg/dL (ref 0.3–1.2)
Total Protein: 7.5 g/dL (ref 6.5–8.1)

## 2019-05-18 LAB — I-STAT BETA HCG BLOOD, ED (MC, WL, AP ONLY): I-stat hCG, quantitative: <5 m[IU]/mL (ref <5)

## 2019-05-18 LAB — BASIC METABOLIC PANEL
Anion gap: 9 (ref 5–15)
BUN: 13 mg/dL (ref 6–20)
CO2: 22 mmol/L (ref 22–32)
Calcium: 9.1 mg/dL (ref 8.9–10.3)
Chloride: 108 mmol/L (ref 98–111)
Creatinine, Ser: 1.03 mg/dL — ABNORMAL HIGH (ref 0.44–1.00)
GFR calc Af Amer: >60 mL/min (ref >60)
GFR calc non Af Amer: >60 mL/min (ref >60)
Glucose, Bld: 105 mg/dL — ABNORMAL HIGH (ref 70–99)
Potassium: 3.3 mmol/L — ABNORMAL LOW (ref 3.5–5.1)
Sodium: 139 mmol/L (ref 135–145)

## 2019-05-18 LAB — CBG MONITORING, ED: Glucose-Capillary: 103 mg/dL — ABNORMAL HIGH (ref 70–99)

## 2019-05-18 LAB — LIPASE, BLOOD: Lipase: 31 U/L (ref 11–51)

## 2019-05-18 MED ORDER — SODIUM CHLORIDE 0.9 % IV BOLUS
1000.0000 mL | Freq: Once | INTRAVENOUS | Status: AC
  Administered 2019-05-18: 1000 mL via INTRAVENOUS

## 2019-05-18 MED ORDER — SODIUM CHLORIDE 0.9% FLUSH: 3.0000 mL | Freq: Once | INTRAVENOUS | Status: DC

## 2019-05-18 MED ORDER — POTASSIUM CHLORIDE CRYS ER 20 MEQ PO TBCR
40.0000 meq | EXTENDED_RELEASE_TABLET | Freq: Once | ORAL | Status: AC
  Administered 2019-05-18: 40 meq via ORAL
  Filled 2019-05-18: qty 2

## 2019-05-18 NOTE — ED Notes (Signed)
Pt ambulatory to and from restroom with steady gait 

## 2019-05-18 NOTE — ED Notes (Signed)
Patient Alert and oriented to baseline. Stable and ambulatory to baseline. Patient verbalized understanding of the discharge instructions.  Patient belongings were taken by the patient.   

## 2019-05-18 NOTE — ED Provider Notes (Signed)
[place] EMERGENCY DEPARTMENT Provider Note   CSN: [id number] Arrival date & time: 05/18/19  7048    History   Chief Complaint Chief Complaint  Patient presents with  . Dizziness    HPI Sara Walsh is a 33 y.o. female.     HPI Patient states that yesterday morning she woke up feeling lightheaded.  States this sensation is worse with changing of positions.  She does have some mild nausea.  Denies headache.  Denies focal weakness or numbness.  No visual changes.  No recent fever or chills.  No chest pain or shortness of breath.  Denies urinary symptoms.  Does admit to generalized fatigue.  Past Medical History:  Diagnosis Date  . Chlamydia   . Fibroid   . Gestational diabetes   . Hypertension   . Obesity   . Scabies   . Thyroid disease   . Trichomonas infection     Patient Active Problem List   Diagnosis Date Noted  . History of gestational diabetes 12/30/2018  . Constipation 06/08/2018  . Essential hypertension 05/18/2018  . Uterine fibroid 01/08/2018    Past Surgical History:  Procedure Laterality Date  . CESAREAN SECTION     C/S x 4  . CESAREAN SECTION WITH BILATERAL TUBAL LIGATION N/A 11/27/2018   Procedure: CESAREAN SECTION;  Surgeon: Donnamae Jude, MD;  Location: [facility];  Service: Obstetrics;  Laterality: N/A;  . WISDOM TOOTH EXTRACTION       OB History    Gravida  11   Para  5   Term  4   Preterm  1   AB  6   Living  5     SAB  1   TAB  5   Ectopic  0   Multiple  0   Live Births  5            Home Medications    Prior to Admission medications   Medication Sig Start Date End Date Taking? Authorizing Provider  amLODipine (NORVASC) 10 MG tablet Take 1 tablet (10 mg total) by mouth daily. Patient not taking: Reported on 05/18/2019 11/19/18   Aletha Halim, MD  Blood Pressure Monitoring (ADULT BLOOD PRESSURE CUFF LG) KIT 1 kit by Does not apply route daily. Patient not taking: Reported on  05/18/2019 08/24/18   Aura Camps, MD  cyclobenzaprine (FLEXERIL) 10 MG tablet Take 1 tablet (10 mg total) by mouth 2 (two) times daily as needed for muscle spasms. Patient not taking: Reported on 05/18/2019 03/26/19   Ripley Fraise, MD  diclofenac (VOLTAREN) 75 MG EC tablet Take 1 tablet (75 mg total) by mouth 2 (two) times daily with a meal. Patient not taking: Reported on 05/18/2019 03/02/19   Donnamae Jude, MD  polyethylene glycol [facility]) packet Take 17 g by mouth daily. Patient not taking: Reported on 05/18/2019 12/30/18   Donnamae Jude, MD  Prenatal Vit-Fe Fumarate-FA (PREPLUS) 27-1 MG TABS Take 1 tablet by mouth daily. Patient not taking: Reported on 05/18/2019 06/08/18   Truett Mainland, DO  senna-docusate (SENOKOT-S) 8.6-50 MG tablet Take 2 tablets by mouth daily. Patient not taking: Reported on 05/18/2019 12/01/18   Daisy Floro, DO    Family History Family History  Problem Relation Age of Onset  . Hypertension Father   . Anesthesia problems Neg Hx     Social History Social History   Tobacco Use  . Smoking status: Former Smoker    Types: Cigars, Cigarettes  Quit date: 09/09/2011    Years since quitting: 7.6  . Smokeless tobacco: Never Used  Substance Use Topics  . Alcohol use: No  . Drug use: Not Currently    Types: Marijuana    Comment: weekends     Allergies   Patient has no known allergies.   Review of Systems Review of Systems  Constitutional: Positive for fatigue. Negative for chills and fever.  HENT: Negative for congestion and sinus pressure.   Eyes: Negative for visual disturbance.  Respiratory: Negative for cough and shortness of breath.   Cardiovascular: Negative for chest pain, palpitations and leg swelling.  Gastrointestinal: Positive for nausea. Negative for abdominal pain, constipation, diarrhea and vomiting.  Genitourinary: Negative for dysuria, frequency and urgency.  Musculoskeletal: Negative for back pain, myalgias and neck pain.   Skin: Negative for rash and wound.  Neurological: Positive for dizziness and light-headedness. Negative for syncope, weakness, numbness and headaches.  All other systems reviewed and are negative.    Physical Exam Updated Vital Signs BP 126/79   Pulse 73   Temp 99.3 F (37.4 C) (Oral)   Resp 16   Ht '5\' 4"'  (1.626 m)   Wt 99.8 kg   SpO2 99%   BMI 37.76 kg/m   Physical Exam Vitals signs and nursing note reviewed.  Constitutional:      Appearance: Normal appearance. She is well-developed.  HENT:     Head: Normocephalic and atraumatic.  Eyes:     Extraocular Movements: Extraocular movements intact.     Pupils: Pupils are equal, round, and reactive to light.  Neck:     Musculoskeletal: Normal range of motion and neck supple. No neck rigidity or muscular tenderness.  Cardiovascular:     Rate and Rhythm: Normal rate and regular rhythm.     Heart sounds: No murmur. No friction rub. No gallop.   Pulmonary:     Effort: Pulmonary effort is normal. No respiratory distress.     Breath sounds: Normal breath sounds. No wheezing or rales.  Abdominal:     General: Bowel sounds are normal.     Palpations: Abdomen is soft.     Tenderness: There is abdominal tenderness. There is no guarding or rebound.     Comments: Mild epigastric tenderness to palpation.  Musculoskeletal: Normal range of motion.        General: No swelling, tenderness, deformity or signs of injury.     Right lower leg: No edema.     Left lower leg: No edema.  Lymphadenopathy:     Cervical: No cervical adenopathy.  Skin:    General: Skin is warm and dry.     Capillary Refill: Capillary refill takes less than 2 seconds.     Findings: No erythema or rash.  Neurological:     General: No focal deficit present.     Mental Status: She is alert and oriented to person, place, and time.  Psychiatric:        Behavior: Behavior normal.      ED Treatments / Results  Labs (all labs ordered are listed, but only abnormal  results are displayed) Labs Reviewed  BASIC METABOLIC PANEL - Abnormal; Notable for the following components:      Result Value   Potassium 3.3 (*)    Glucose, Bld 105 (*)    Creatinine, Ser 1.03 (*)    All other components within normal limits  CBC - Abnormal; Notable for the following components:   RBC 5.87 (*)  MCV 72.4 (*)    MCH 22.7 (*)    RDW 17.7 (*)    All other components within normal limits  URINALYSIS, ROUTINE W REFLEX MICROSCOPIC - Abnormal; Notable for the following components:   APPearance HAZY (*)    Protein, ur 30 (*)    Bacteria, UA FEW (*)    All other components within normal limits  CBG MONITORING, ED - Abnormal; Notable for the following components:   Glucose-Capillary 103 (*)    All other components within normal limits  HEPATIC FUNCTION PANEL  LIPASE, BLOOD  LIPASE, BLOOD  I-STAT BETA HCG BLOOD, ED ([facility], [facility], [facility] ONLY)    EKG EKG Interpretation  Date/Time:  Tuesday May 18 2019 06:52:03 EDT Ventricular Rate:  77 PR Interval:  160 QRS Duration: 92 QT Interval:  372 QTC Calculation: 420 R Axis:   42 Text Interpretation:  Normal sinus rhythm T wave abnormality, consider inferior ischemia Abnormal ECG Confirmed by Julianne Rice [phone]) on 05/18/2019 7:16:41 AM   Radiology No results found.  Procedures Procedures (including critical care time)  Medications Ordered in ED Medications  sodium chloride flush (NS) 0.9 % injection 3 mL (has no administration in time range)  sodium chloride 0.9 % bolus 1,000 mL (0 mLs Intravenous Stopped 05/18/19 1012)  potassium chloride SA (K-DUR) CR tablet 40 mEq (40 mEq Oral Given 05/18/19 0909)     Initial Impression / Assessment and Plan / ED Course  I have reviewed the triage vital signs and the nursing notes.  Pertinent labs & imaging results that were available during my care of the patient were reviewed by me and considered in my medical decision making (see chart for details).       Patient feels  better after the fluids.  Mild hypokalemia with oral replacement in the emergency department.  Suspect dizziness related to mild dehydration.  Return precautions given.   Final Clinical Impressions(s) / ED Diagnoses   Final diagnoses:  Dizziness  Hypokalemia    ED Discharge Orders    None       Julianne Rice, MD 05/18/19 1123

## 2019-05-18 NOTE — ED Triage Notes (Signed)
Pt reports that when she lies down she feels lightheaded like she is going to pass out. Associated with nausea. Denies pain.

## 2019-06-15 ENCOUNTER — Ambulatory Visit (INDEPENDENT_AMBULATORY_CARE_PROVIDER_SITE_OTHER): Payer: Medicaid Other | Admitting: Emergency Medicine

## 2019-06-15 ENCOUNTER — Other Ambulatory Visit: Payer: Self-pay

## 2019-06-15 DIAGNOSIS — Z3042 Encounter for surveillance of injectable contraceptive: Secondary | ICD-10-CM

## 2019-06-15 MED ORDER — MEDROXYPROGESTERONE ACETATE 150 MG/ML IM SUSP
150.0000 mg | Freq: Once | INTRAMUSCULAR | Status: AC
  Administered 2019-06-15: 150 mg via INTRAMUSCULAR

## 2019-06-15 NOTE — Progress Notes (Signed)
Sara Walsh here for Depo-Provera  Injection.  Injection administered without complication. Patient will return in 3 months for next injection.  Sara Sousa, RN  06/15/19   1:56

## 2019-08-01 ENCOUNTER — Emergency Department (HOSPITAL_COMMUNITY)
Admission: EM | Admit: 2019-08-01 | Discharge: 2019-08-01 | Disposition: A | Discharge status: Home / Self Care | Payer: Medicaid Other | Attending: Emergency Medicine | Admitting: Emergency Medicine

## 2019-08-01 ENCOUNTER — Encounter (HOSPITAL_COMMUNITY): Payer: Self-pay | Admitting: Emergency Medicine

## 2019-08-01 DIAGNOSIS — E079 Disorder of thyroid, unspecified: Secondary | ICD-10-CM | POA: Insufficient documentation

## 2019-08-01 DIAGNOSIS — K219 Gastro-esophageal reflux disease without esophagitis: Secondary | ICD-10-CM | POA: Insufficient documentation

## 2019-08-01 DIAGNOSIS — Z79899 Other long term (current) drug therapy: Secondary | ICD-10-CM | POA: Diagnosis not present

## 2019-08-01 DIAGNOSIS — I10 Essential (primary) hypertension: Secondary | ICD-10-CM | POA: Insufficient documentation

## 2019-08-01 DIAGNOSIS — R630 Anorexia: Secondary | ICD-10-CM | POA: Insufficient documentation

## 2019-08-01 DIAGNOSIS — R1084 Generalized abdominal pain: Secondary | ICD-10-CM | POA: Insufficient documentation

## 2019-08-01 DIAGNOSIS — R0789 Other chest pain: Secondary | ICD-10-CM | POA: Diagnosis not present

## 2019-08-01 DIAGNOSIS — Z87891 Personal history of nicotine dependence: Secondary | ICD-10-CM | POA: Insufficient documentation

## 2019-08-01 DIAGNOSIS — R112 Nausea with vomiting, unspecified: Secondary | ICD-10-CM | POA: Insufficient documentation

## 2019-08-01 LAB — HEPATIC FUNCTION PANEL
ALT: 14 U/L (ref 0–44)
AST: 13 U/L — ABNORMAL LOW (ref 15–41)
Albumin: 3.5 g/dL (ref 3.5–5.0)
Alkaline Phosphatase: 71 U/L (ref 38–126)
Bilirubin, Direct: 0.1 mg/dL (ref 0.0–0.2)
Indirect Bilirubin: 0.6 mg/dL (ref 0.3–0.9)
Total Bilirubin: 0.7 mg/dL (ref 0.3–1.2)
Total Protein: 6.7 g/dL (ref 6.5–8.1)

## 2019-08-01 LAB — HIV ANTIBODY (ROUTINE TESTING W REFLEX): HIV Screen 4th Generation wRfx: NONREACTIVE

## 2019-08-01 LAB — CBC WITH DIFFERENTIAL/PLATELET
Abs Immature Granulocytes: 0.03 10*3/uL (ref 0.00–0.07)
Basophils Absolute: 0 10*3/uL (ref 0.0–0.1)
Basophils Relative: 0 %
Eosinophils Absolute: 0.1 10*3/uL (ref 0.0–0.5)
Eosinophils Relative: 1 %
HCT: 45.3 % (ref 36.0–46.0)
Hemoglobin: 14.1 g/dL (ref 12.0–15.0)
Immature Granulocytes: 0 %
Lymphocytes Relative: 42 %
Lymphs Abs: 3.2 10*3/uL (ref 0.7–4.0)
MCH: 23.2 pg — ABNORMAL LOW (ref 26.0–34.0)
MCHC: 31.1 g/dL (ref 30.0–36.0)
MCV: 74.5 fL — ABNORMAL LOW (ref 80.0–100.0)
Monocytes Absolute: 0.6 10*3/uL (ref 0.1–1.0)
Monocytes Relative: 8 %
Neutro Abs: 3.6 10*3/uL (ref 1.7–7.7)
Neutrophils Relative %: 49 %
Platelets: 367 10*3/uL (ref 150–400)
RBC: 6.08 MIL/uL — ABNORMAL HIGH (ref 3.87–5.11)
RDW: 17.7 % — ABNORMAL HIGH (ref 11.5–15.5)
WBC: 7.5 10*3/uL (ref 4.0–10.5)
nRBC: 0 % (ref 0.0–0.2)

## 2019-08-01 LAB — LIPASE, BLOOD: Lipase: 34 U/L (ref 11–51)

## 2019-08-01 MED ORDER — SUCRALFATE 1 G PO TABS: 1.0000 g | ORAL_TABLET | Freq: Three times a day (TID) | ORAL | 0 refills | Status: DC

## 2019-08-01 MED ORDER — SUCRALFATE 1 GM/10ML PO SUSP
1.0000 g | Freq: Once | ORAL | Status: AC
  Administered 2019-08-01: 1 g via ORAL
  Filled 2019-08-01: qty 10

## 2019-08-01 MED ORDER — FAMOTIDINE IN NACL 20-0.9 MG/50ML-% IV SOLN
20.0000 mg | Freq: Once | INTRAVENOUS | Status: AC
  Administered 2019-08-01: 12:00:00 20 mg via INTRAVENOUS
  Filled 2019-08-01: qty 50

## 2019-08-01 MED ORDER — OMEPRAZOLE 20 MG PO CPDR: 20.0000 mg | DELAYED_RELEASE_CAPSULE | Freq: Every day | ORAL | 0 refills | Status: DC

## 2019-08-01 MED ORDER — SODIUM CHLORIDE 0.9 % IV BOLUS
500.0000 mL | Freq: Once | INTRAVENOUS | Status: AC
  Administered 2019-08-01: 12:00:00 500 mL via INTRAVENOUS

## 2019-08-01 NOTE — ED Provider Notes (Signed)
[place] EMERGENCY DEPARTMENT Provider Note   CSN: [id number] Arrival date & time: 08/01/19  1607     History   Chief Complaint Chief Complaint  Patient presents with  . Gastroesophageal Reflux  . Chest Pain    HPI Sara Walsh is a 33 y.o. female.     HPI Patient presents with concern of nausea, abdominal pain. Patient patient has multiple medical issues, including history of chlamydia, hypertension, obesity. Patient takes Depakote shots, has a history of fibroids. Somewhat atypically, the patient is currently having a bleeding episode, with lower abdominal pain, described as crampy, minimally improved with ibuprofen. Concurrently she has developed pain in the epigastrium, with nausea, vomiting, anorexia. No fever, no chest pain, no dyspnea. Upper abdominal pain is sharp, burning.    Past Medical History:  Diagnosis Date  . Chlamydia   . Fibroid   . Gestational diabetes   . Hypertension   . Obesity   . Scabies   . Thyroid disease   . Trichomonas infection     Patient Active Problem List   Diagnosis Date Noted  . History of gestational diabetes 12/30/2018  . Constipation 06/08/2018  . Essential hypertension 05/18/2018  . Uterine fibroid 01/08/2018    Past Surgical History:  Procedure Laterality Date  . CESAREAN SECTION     C/S x 4  . CESAREAN SECTION WITH BILATERAL TUBAL LIGATION N/A 11/27/2018   Procedure: CESAREAN SECTION;  Surgeon: Donnamae Jude, MD;  Location: [facility];  Service: Obstetrics;  Laterality: N/A;  . WISDOM TOOTH EXTRACTION       OB History    Gravida  11   Para  5   Term  4   Preterm  1   AB  6   Living  5     SAB  1   TAB  5   Ectopic  0   Multiple  0   Live Births  5            Home Medications    Prior to Admission medications   Medication Sig Start Date End Date Taking? Authorizing Provider  ibuprofen (ADVIL) 200 MG tablet Take 800 mg by mouth every 6 (six) hours as  needed for moderate pain.   Yes [provider]  amLODipine (NORVASC) 10 MG tablet Take 1 tablet (10 mg total) by mouth daily. Patient not taking: Reported on 05/18/2019 11/19/18   Aletha Halim, MD  Blood Pressure Monitoring (ADULT BLOOD PRESSURE CUFF LG) KIT 1 kit by Does not apply route daily. Patient not taking: Reported on 05/18/2019 08/24/18   Aura Camps, MD  cyclobenzaprine (FLEXERIL) 10 MG tablet Take 1 tablet (10 mg total) by mouth 2 (two) times daily as needed for muscle spasms. Patient not taking: Reported on 05/18/2019 03/26/19   Ripley Fraise, MD  diclofenac (VOLTAREN) 75 MG EC tablet Take 1 tablet (75 mg total) by mouth 2 (two) times daily with a meal. Patient not taking: Reported on 05/18/2019 03/02/19   Donnamae Jude, MD  omeprazole (PRILOSEC) 20 MG capsule Take 1 capsule (20 mg total) by mouth daily. Take one tablet daily 08/01/19   Carmin Muskrat, MD  polyethylene glycol [facility]) packet Take 17 g by mouth daily. Patient not taking: Reported on 05/18/2019 12/30/18   Donnamae Jude, MD  Prenatal Vit-Fe Fumarate-FA (PREPLUS) 27-1 MG TABS Take 1 tablet by mouth daily. Patient not taking: Reported on 05/18/2019 06/08/18   Truett Mainland, DO  senna-docusate (SENOKOT-S)  8.6-50 MG tablet Take 2 tablets by mouth daily. Patient not taking: Reported on 05/18/2019 12/01/18   Milus Banister C, DO  sucralfate (CARAFATE) 1 g tablet Take 1 tablet (1 g total) by mouth 4 (four) times daily -  with meals and at bedtime. 08/01/19   Carmin Muskrat, MD    Family History Family History  Problem Relation Age of Onset  . Hypertension Father   . Anesthesia problems Neg Hx     Social History Social History   Tobacco Use  . Smoking status: Former Smoker    Types: Cigars, Cigarettes    Quit date: 09/09/2011    Years since quitting: 7.8  . Smokeless tobacco: Never Used  Substance Use Topics  . Alcohol use: No  . Drug use: Not Currently    Types: Marijuana    Comment: weekends      Allergies   Patient has no known allergies.   Review of Systems Review of Systems  Constitutional:       Per HPI, otherwise negative  HENT:       Per HPI, otherwise negative  Respiratory:       Per HPI, otherwise negative  Cardiovascular:       Per HPI, otherwise negative  Gastrointestinal: Positive for abdominal pain, nausea and vomiting.  Endocrine:       Negative aside from HPI  Genitourinary:       Neg aside from HPI   Musculoskeletal:       Per HPI, otherwise negative  Skin: Negative.   Neurological: Negative for syncope.     Physical Exam Updated Vital Signs BP (!) 148/94   Pulse 62   Temp 98.6 F (37 C) (Oral)   Resp (!) 22   Ht '5\' 4"'  (1.626 m)   Wt 99.8 kg   SpO2 99%   BMI 37.76 kg/m   Physical Exam Vitals signs and nursing note reviewed.  Constitutional:      General: She is not in acute distress.    Appearance: She is well-developed.  HENT:     Head: Normocephalic and atraumatic.  Eyes:     Conjunctiva/sclera: Conjunctivae normal.  Cardiovascular:     Rate and Rhythm: Normal rate and regular rhythm.  Pulmonary:     Effort: Pulmonary effort is normal. No respiratory distress.     Breath sounds: Normal breath sounds. No stridor.  Abdominal:     General: There is no distension.     Comments: No evidence for peritonitis, tenderness palpation in the epigastrium, and suprapubic regions.  Skin:    General: Skin is warm and dry.  Neurological:     Mental Status: She is alert and oriented to person, place, and time.     Cranial Nerves: No cranial nerve deficit.      ED Treatments / Results  Labs (all labs ordered are listed, but only abnormal results are displayed) Labs Reviewed  HEPATIC FUNCTION PANEL - Abnormal; Notable for the following components:      Result Value   AST 13 (*)    All other components within normal limits  CBC WITH DIFFERENTIAL/PLATELET - Abnormal; Notable for the following components:   RBC 6.08 (*)    MCV 74.5  (*)    MCH 23.2 (*)    RDW 17.7 (*)    All other components within normal limits  LIPASE, BLOOD  HIV ANTIBODY (ROUTINE TESTING W REFLEX)  HIV4GL SAVE TUBE  RPR  GC/CHLAMYDIA PROBE AMP ([place]) NOT AT [facility]  EKG EKG Interpretation  Date/Time:  'Sunday August 01 2019 11:38:33 EDT Ventricular Rate:  63 PR Interval:    QRS Duration: 96 QT Interval:  403 QTC Calculation: 413 R Axis:   -13 Text Interpretation:  Sinus arrhythmia Consider left atrial enlargement Borderline T abnormalities, anterior leads No significant change since last tracing Abnormal ECG Confirmed by Esau Fridman ([id number]) on 08/01/2019 12:47:14 PM   Radiology No results found.  Procedures Procedures (including critical care time)  Medications Ordered in ED Medications  sodium chloride 0.9 % bolus 500 mL (0 mLs Intravenous Stopped 08/01/19 1334)  famotidine (PEPCID) IVPB 20 mg premix (0 mg Intravenous Stopped 08/01/19 1243)  sucralfate (CARAFATE) 1 GM/10ML suspension 1 g (1 g Oral Given 08/01/19 1214)     Initial Impression / Assessment and Plan / ED Course  I have reviewed the triage vital signs and the nursing notes.  Pertinent labs & imaging results that were available during my care of the patient were reviewed by me and considered in my medical decision making (see chart for details).     On exam the patient is in no distress. She states that she feels somewhat better. Taken is to have a non-peritoneal abdomen, no distress, no hemodynamic instability. She requests evaluation for STD, notes that she has a history of these. She however, has no discharge, no evidence for peritonitis, no endometritis Patient amenable to performing her own cervical swabs, these were sent for evaluation, as well as testing for HIV, syphilis. Patient states that she will follow-up with her gynecologist or physician on Monday, tomorrow With improvement here, no evidence for atypical ACS, no evidence for peritonitis, no  evidence for endometritis or PID, the patient is discharged with therapy for likely gastroesophageal irritation, as well as ongoing studies for infection. Some suspicion for her fibroids contributing to her bleeding, discomfort, and she will follow-up tomorrow with gynecology.  Final Clinical Impressions(s) / ED Diagnoses   Final diagnoses:  Generalized abdominal pain    ED Discharge Orders         Ordered    omeprazole (PRILOSEC) 20 MG capsule  Daily     10' /04/20 1502    sucralfate (CARAFATE) 1 g tablet  3 times daily with meals & bedtime    Note to Pharmacy: Take for one week   08/01/19 1502           Carmin Muskrat, MD 08/01/19 1610

## 2019-08-01 NOTE — Discharge Instructions (Signed)
As discussed, today's evaluation has been generally reassuring. However, it is important that you follow-up with your physician on Monday. Recall that you have several laboratory studies pending, and these results should be available in the coming days.  Please take all medication as directed, and do not hesitate to return here for concerning changes in your condition.

## 2019-08-01 NOTE — ED Triage Notes (Signed)
Pt. Stated, For about a week It feels like something coming up in my chest. It just starts randomly.

## 2019-08-01 NOTE — ED Notes (Signed)
ED Provider at bedside. 

## 2019-08-02 LAB — RPR: RPR Ser Ql: NONREACTIVE

## 2019-08-03 LAB — GC/CHLAMYDIA PROBE AMP (~~LOC~~) NOT AT ARMC
Chlamydia: NEGATIVE
Neisseria Gonorrhea: NEGATIVE

## 2019-08-31 ENCOUNTER — Ambulatory Visit (INDEPENDENT_AMBULATORY_CARE_PROVIDER_SITE_OTHER): Payer: Medicaid Other | Admitting: Emergency Medicine

## 2019-08-31 ENCOUNTER — Other Ambulatory Visit: Payer: Self-pay

## 2019-08-31 DIAGNOSIS — Z3042 Encounter for surveillance of injectable contraceptive: Secondary | ICD-10-CM | POA: Diagnosis present

## 2019-08-31 DIAGNOSIS — R3915 Urgency of urination: Secondary | ICD-10-CM

## 2019-08-31 LAB — POCT URINALYSIS DIP (DEVICE)
Bilirubin Urine: NEGATIVE
Glucose, UA: NEGATIVE mg/dL
Hgb urine dipstick: NEGATIVE
Ketones, ur: NEGATIVE mg/dL
Leukocytes,Ua: NEGATIVE
Nitrite: NEGATIVE
Protein, ur: NEGATIVE mg/dL
Specific Gravity, Urine: 1.025 (ref 1.005–1.030)
Urobilinogen, UA: 0.2 mg/dL (ref 0.0–1.0)
pH: 6.5 (ref 5.0–8.0)

## 2019-08-31 MED ORDER — MEDROXYPROGESTERONE ACETATE 150 MG/ML IM SUSP
150.0000 mg | Freq: Once | INTRAMUSCULAR | Status: AC
  Administered 2019-08-31: 150 mg via INTRAMUSCULAR

## 2019-08-31 NOTE — Progress Notes (Signed)
Agree with A & P. 

## 2019-08-31 NOTE — Progress Notes (Signed)
Sara Walsh here for Depo-Provera  Injection.  Injection administered without complication. Patient will return in 3 months for next injection.  Pt complains of abdominal pain that she feel is related to her fibroids. Pt also states she wants to discuss different birth control options. Pt to schedule an appointment with a provider to address these concerns.   Pt complains of urinary urgency. Denies dysuria and urine odor. UA completed with negative result. Urine to be sent for culture. Pt informed that culture could take a couple of days to result and she would be informed of abnormal results. Pt verbalized understanding and had no further questions or concerns.   Sara Sousa, RN 08/31/19   1119

## 2019-09-01 LAB — URINE CULTURE

## 2019-09-29 ENCOUNTER — Ambulatory Visit: Payer: Medicaid Other | Admitting: Obstetrics and Gynecology

## 2019-09-29 ENCOUNTER — Other Ambulatory Visit: Payer: Self-pay

## 2019-09-29 ENCOUNTER — Ambulatory Visit (INDEPENDENT_AMBULATORY_CARE_PROVIDER_SITE_OTHER): Payer: Medicaid Other | Admitting: Obstetrics and Gynecology

## 2019-09-29 ENCOUNTER — Encounter: Payer: Self-pay | Admitting: Obstetrics and Gynecology

## 2019-09-29 ENCOUNTER — Other Ambulatory Visit (HOSPITAL_COMMUNITY)
Admission: RE | Admit: 2019-09-29 | Discharge: 2019-09-29 | Disposition: A | Discharge status: Home / Self Care | Payer: Medicaid Other | Source: Ambulatory Visit | Attending: Obstetrics and Gynecology | Admitting: Obstetrics and Gynecology

## 2019-09-29 VITALS — BP 147/87 | HR 72 | Wt 250.7 lb

## 2019-09-29 DIAGNOSIS — Z86018 Personal history of other benign neoplasm: Secondary | ICD-10-CM | POA: Insufficient documentation

## 2019-09-29 DIAGNOSIS — Z87898 Personal history of other specified conditions: Secondary | ICD-10-CM

## 2019-09-29 DIAGNOSIS — Z3042 Encounter for surveillance of injectable contraceptive: Secondary | ICD-10-CM

## 2019-09-29 DIAGNOSIS — N941 Unspecified dyspareunia: Secondary | ICD-10-CM

## 2019-09-29 NOTE — Progress Notes (Signed)
Obstetrics and Gynecology Visit Return Patient Evaluation  Appointment Date: 09/29/2019  Primary Care Provider: Medicine, [facility]  OBGYN Clinic: Center for [facility]  Chief Complaint: follow up October pelvic pain and ED visit  History of Present Illness:  Sara Walsh is a 33 y.o. P5 with above CC. PMHx significnat for BMI 40s, HTN, h/o c-section x 5.  Patient went to ED on 10/4 for upper abdominal pain. GC/CT negative, no pelvic u/s done and thought it was ? GERD. Patient states she had some slight VB as well and some nausea and vomiting.   She's been on depo since her delivery earlier this year.  She has occasional pain with intercourse, deeper penetration.   She is wondering if her fibroids are the cause and/or if she needs a different type of BC. She states she did not get her tubes tied with her last c-section  Review of Systems:  as noted in the History of Present Illness.  Patient Active Problem List   Diagnosis Date Noted  . Dyspareunia in female 09/29/2019  . History of uterine fibroid 09/29/2019  . History of gestational diabetes 12/30/2018  . Constipation 06/08/2018  . Essential hypertension 05/18/2018  . Uterine fibroid 01/08/2018   Medications:  None  Allergies: has No Known Allergies.  Physical Exam:  BP (!) 147/87   Pulse 72   Wt 250 lb 11.2 oz (113.7 kg)   BMI 43.03 kg/m  Body mass index is 43.03 kg/m. General appearance: Well nourished, well developed female in no acute distress.  Abdomen: obese nttp, nd Neuro/Psych:  Normal mood and affect.    Pelvic exam:  EGBUS: normal Vagina: white d/c, no smell, no blood Cervix: nttp Bimanual: some discomfort with both abd and vaginal hand.  Assessment: pt stable  Plan:  1. Dyspareunia in female Will repeat swabs and get pelvic u/s. Only non OB u/s back in 2016 showed small 2cm IM fibroid. D/w her that could be sequelae from 5 prior c-sections - Cervicovaginal  ancillary only - US PELVIC COMPLETE WITH TRANSVAGINAL; Future  2. History of uterine fibroid - US PELVIC COMPLETE WITH TRANSVAGINAL; Future  3. History of abdominal pain See below  4. Depo provera surveillance D/w her that her October episode could be related to hemorrhagic cyst/breakthrough ovulation but hard to say since no u/s done at that time. Since that was only episode since starting it about 9 months ago, I told her I recommend continuing on it since it seems to be working well; pt told if happens again to call us for asap outpatient u/s to try and keep her out of the ED.  Next shot scheduled for January  5. Primary Care I recommended to patient that she call her PCP for a check up sometime in next few months for routine care.    RTC: PRN. Follow up patient re: u/s results  Durene Romans MD Attending Center for [facility])

## 2019-09-30 LAB — CERVICOVAGINAL ANCILLARY ONLY
Bacterial Vaginitis (gardnerella): POSITIVE — AB
Candida Glabrata: NEGATIVE
Candida Vaginitis: NEGATIVE
Chlamydia: NEGATIVE
Comment: NEGATIVE
Comment: NEGATIVE
Comment: NEGATIVE
Comment: NEGATIVE
Comment: NEGATIVE
Comment: NORMAL
Neisseria Gonorrhea: NEGATIVE
Trichomonas: NEGATIVE

## 2019-10-04 ENCOUNTER — Other Ambulatory Visit: Payer: Self-pay | Admitting: Obstetrics and Gynecology

## 2019-10-04 MED ORDER — METRONIDAZOLE 500 MG PO TABS: 500.0000 mg | ORAL_TABLET | Freq: Two times a day (BID) | ORAL | 0 refills | Status: AC

## 2019-10-06 ENCOUNTER — Other Ambulatory Visit: Payer: Self-pay

## 2019-10-06 ENCOUNTER — Ambulatory Visit (HOSPITAL_COMMUNITY)
Admission: RE | Admit: 2019-10-06 | Discharge: 2019-10-06 | Disposition: A | Discharge status: Home / Self Care | Payer: Medicaid Other | Source: Ambulatory Visit | Attending: Obstetrics and Gynecology | Admitting: Obstetrics and Gynecology

## 2019-10-06 DIAGNOSIS — N941 Unspecified dyspareunia: Secondary | ICD-10-CM | POA: Insufficient documentation

## 2019-10-06 DIAGNOSIS — Z86018 Personal history of other benign neoplasm: Secondary | ICD-10-CM | POA: Diagnosis present

## 2019-11-16 ENCOUNTER — Ambulatory Visit: Payer: Medicaid Other

## 2019-11-19 ENCOUNTER — Encounter: Payer: Self-pay | Admitting: Obstetrics and Gynecology

## 2019-11-19 ENCOUNTER — Ambulatory Visit (INDEPENDENT_AMBULATORY_CARE_PROVIDER_SITE_OTHER): Payer: Medicaid Other | Admitting: Obstetrics and Gynecology

## 2019-11-19 ENCOUNTER — Other Ambulatory Visit: Payer: Self-pay

## 2019-11-19 VITALS — BP 135/96 | HR 80 | Wt 248.1 lb

## 2019-11-19 DIAGNOSIS — Z3042 Encounter for surveillance of injectable contraceptive: Secondary | ICD-10-CM

## 2019-11-19 DIAGNOSIS — R102 Pelvic and perineal pain: Secondary | ICD-10-CM

## 2019-11-19 MED ORDER — MEDROXYPROGESTERONE ACETATE 150 MG/ML IM SUSP
150.0000 mg | Freq: Once | INTRAMUSCULAR | Status: AC
  Administered 2019-11-19: 150 mg via INTRAMUSCULAR

## 2019-11-19 NOTE — Progress Notes (Signed)
Obstetrics and Gynecology Visit Return Patient Evaluation  Appointment Date: 11/19/2019  Primary Care Provider: Medicine, [facility]  OBGYN Clinic: [facility]  Chief Complaint: f/u pelvic pain and depo provera.  History of Present Illness:  Sara Walsh is a 34 y.o. PMHx significnat for BMI 40s, HTN, h/o c-section x 5. Patient went to ED on 10/4 for upper abdominal pain. GC/CT negative, no pelvic u/s done and thought it was ? GERD. Patient states she had some slight VB as well and some nausea and vomiting.   She's been on depo since her delivery earlier this year.  Patient seen by me on 09/29/2019 with plan to continue on depo provera and see how she is doing when she was due for next shot; a repeat pelvic u/s was ordered in the interim.   Interval History: Since that time, she states that she is asymptomatic with bleeding and pain and she is feeling much better; she has not had sexual intercourse since her last visit. Her pelvic u/s (first non OB one in many years) shows a slightly larger [id number] calicified IM fibroid (see below).   Review of Systems: as noted in the History of Present Illness.   Patient Active Problem List   Diagnosis Date Noted  . Dyspareunia in female 09/29/2019  . History of uterine fibroid 09/29/2019  . History of gestational diabetes 12/30/2018  . Constipation 06/08/2018  . Essential hypertension 05/18/2018  . Uterine fibroid 01/08/2018   Medications:  Ashlei R. Douthitt had no medications administered during this visit. Current Outpatient Medications  Medication Sig Dispense Refill  . ergocalciferol (VITAMIN D2) 1.25 MG (50000 UT) capsule Take 50,000 Units by mouth once a week.    Marland Kitchen ibuprofen (ADVIL) 200 MG tablet Take 800 mg by mouth every 6 (six) hours as needed for moderate pain.    Marland Kitchen amLODipine (NORVASC) 10 MG tablet Take 1 tablet (10 mg total) by mouth daily. (Patient not taking: Reported on 05/18/2019) 30  tablet 1  . cyclobenzaprine (FLEXERIL) 10 MG tablet Take 1 tablet (10 mg total) by mouth 2 (two) times daily as needed for muscle spasms. (Patient not taking: Reported on 05/18/2019) 20 tablet 0   No current facility-administered medications for this visit.    Allergies: has No Known Allergies.  Physical Exam:  BP (!) 135/96   Pulse 80   Wt 248 lb 1.6 oz (112.5 kg)   BMI 42.59 kg/m  Body mass index is 42.59 kg/m. General appearance: Well nourished, well developed female in no acute distress.  Neuro/Psych:  Normal mood and affect.    Labs None  Radiology CLINICAL DATA:  Pelvic pain, history of fibroids  EXAM: TRANSABDOMINAL AND TRANSVAGINAL ULTRASOUND OF PELVIS  TECHNIQUE: Both transabdominal and transvaginal ultrasound examinations of the pelvis were performed. Transabdominal technique was performed for global imaging of the pelvis including uterus, ovaries, adnexal regions, and pelvic cul-de-sac. It was necessary to proceed with endovaginal exam following the transabdominal exam to visualize the endometrium.  COMPARISON:  10/01/2015  FINDINGS: Uterus  Measurements: 12.7 x 7.2 x 9.5 cm = volume: 451 mL. 6.2 x 5.1 x 5.9 cm calcified intramural fibroid in the right posterior uterine body.  Endometrium  Thickness: 8 mm.  No focal abnormality visualized.  Right ovary  Measurements: 5.5 x 1.4 x 2.0 cm = volume: 8.4 mL. Normal appearance/no adnexal mass.  Left ovary  Measurements: 3.9 x 2.1 x 1.9 cm = volume: 8.5 mL. Normal appearance/no adnexal mass.  Other  findings  No abnormal free fluid.  IMPRESSION: 6.2 cm calcified intramural fibroid in the right posterior uterine body.   Electronically Signed   By: Julian Hy M.D.   On: 10/06/2019 08:57  Assessment: pt improved  Plan: Continue with depo provera. If any repeat s/s, pt to let us know  RTC: 6m for depo provera injection visit  Durene Romans MD Attending Center  for [facility])

## 2019-12-03 ENCOUNTER — Ambulatory Visit (HOSPITAL_COMMUNITY)
Admission: EM | Admit: 2019-12-03 | Discharge: 2019-12-03 | Disposition: A | Discharge status: Home / Self Care | Payer: Medicaid Other | Attending: Family Medicine | Admitting: Family Medicine

## 2019-12-03 ENCOUNTER — Encounter (HOSPITAL_COMMUNITY): Payer: Self-pay | Admitting: Emergency Medicine

## 2019-12-03 ENCOUNTER — Other Ambulatory Visit: Payer: Self-pay

## 2019-12-03 DIAGNOSIS — R0789 Other chest pain: Secondary | ICD-10-CM | POA: Insufficient documentation

## 2019-12-03 DIAGNOSIS — Z113 Encounter for screening for infections with a predominantly sexual mode of transmission: Secondary | ICD-10-CM | POA: Diagnosis not present

## 2019-12-03 NOTE — ED Triage Notes (Signed)
Pt states shes had chest pain off and on since yesterday. States she had an "MRI and it said one side of my heart is larger than the other". Pt also requesting std testing.

## 2019-12-03 NOTE — Discharge Instructions (Signed)
Continue blood pressure medicine Follow up with cardiology outpatient for further evaluation of your heart If you develop persistent or worsening chest pain, shortness of breath, difficulty breathing, dizziness, lightheadedness, vision changes, weakness follow up in emergency room  We are testing you for Gonorrhea, Chlamydia, Trichomonas, Yeast and Bacterial Vaginosis. We will call you if anything is positive and let you know if you require any further treatment. Please inform partners of any positive results.

## 2019-12-04 NOTE — ED Provider Notes (Signed)
[place]    CSN: [id number] Arrival date & time: 12/03/19  1810      History   Chief Complaint Chief Complaint  Patient presents with  . Chest Pain  . SEXUALLY TRANSMITTED DISEASE    HPI Sara Walsh is a 34 y.o. female history of fibroids, hypertension, presenting today for evaluation of chest pain.  Patient states that for a while she has had these occasional episodes of chest pain.  In the past 2 days she has had increased frequency of this.  Typically will occur approximately 3 times per day.  Are very brief.  Has difficulty explaining quality of pain, initially describes as sharp.  States that she has some mild associated shortness of breath which also is relatively brief.  Denies nausea or vomiting.  Denies abdominal pain.  Denies prior history of CAD/MI.  Is on blood pressure medicine and takes regularly.  Denies history of diabetes.  Denies regular tobacco use, but does report smoking marijuana.  Denies family history of MI at an early age.  Denies prior DVT/PE.  Denies leg pain or leg swelling.  Denies estrogen, on Depo for birth control.  Denies recent travel/immobilization.  Denies any current chest pain or shortness of breath at time of visit.  Patient is also requesting STD screening.  She is concerned about possible herpes exposure.  She denies any symptoms of vaginal discharge itching irritation or pelvic pain.  Denies abdominal pain.  Denies nausea or vomiting.    HPI  Past Medical History:  Diagnosis Date  . Chlamydia   . Fibroid   . Gestational diabetes   . Hypertension   . Obesity   . Scabies   . Thyroid disease   . Trichomonas infection     Patient Active Problem List   Diagnosis Date Noted  . Dyspareunia in female 09/29/2019  . History of uterine fibroid 09/29/2019  . History of gestational diabetes 12/30/2018  . Constipation 06/08/2018  . Essential hypertension 05/18/2018  . Uterine fibroid 01/08/2018    Past Surgical History:    Procedure Laterality Date  . CESAREAN SECTION     C/S x 4  . CESAREAN SECTION WITH BILATERAL TUBAL LIGATION N/A 11/27/2018   Procedure: CESAREAN SECTION;  Surgeon: Donnamae Jude, MD;  Location: [facility];  Service: Obstetrics;  Laterality: N/A;  . WISDOM TOOTH EXTRACTION      OB History    Gravida  11   Para  5   Term  4   Preterm  1   AB  6   Living  5     SAB  1   TAB  5   Ectopic  0   Multiple  0   Live Births  5            Home Medications    Prior to Admission medications   Medication Sig Start Date End Date Taking? Authorizing Provider  amLODipine (NORVASC) 10 MG tablet Take 1 tablet (10 mg total) by mouth daily. 11/19/18  Yes Aletha Halim, MD  cyclobenzaprine (FLEXERIL) 10 MG tablet Take 1 tablet (10 mg total) by mouth 2 (two) times daily as needed for muscle spasms. Patient not taking: Reported on 05/18/2019 03/26/19   Ripley Fraise, MD  ergocalciferol (VITAMIN D2) 1.25 MG (50000 UT) capsule Take 50,000 Units by mouth once a week.    [provider]  ibuprofen (ADVIL) 200 MG tablet Take 800 mg by mouth every 6 (six) hours as needed  for moderate pain.    [provider]    Family History Family History  Problem Relation Age of Onset  . Hypertension Father   . Anesthesia problems Neg Hx     Social History Social History   Tobacco Use  . Smoking status: Former Smoker    Types: Cigars, Cigarettes    Quit date: 09/09/2011    Years since quitting: 8.2  . Smokeless tobacco: Never Used  Substance Use Topics  . Alcohol use: No  . Drug use: Not Currently    Types: Marijuana    Comment: weekends     Allergies   Patient has no known allergies.   Review of Systems Review of Systems  Constitutional: Negative for fatigue and fever.  HENT: Negative for congestion, sinus pressure and sore throat.   Eyes: Negative for photophobia, pain and visual disturbance.  Respiratory: Positive for shortness of breath.  Negative for cough.   Cardiovascular: Positive for chest pain.  Gastrointestinal: Negative for abdominal pain, diarrhea, nausea and vomiting.  Genitourinary: Negative for decreased urine volume, dysuria, flank pain, genital sores, hematuria, menstrual problem, vaginal bleeding, vaginal discharge and vaginal pain.  Musculoskeletal: Negative for back pain, myalgias, neck pain and neck stiffness.  Skin: Negative for rash.  Neurological: Negative for dizziness, syncope, facial asymmetry, speech difficulty, weakness, light-headedness, numbness and headaches.     Physical Exam Triage Vital Signs ED Triage Vitals  Enc Vitals Group     BP 12/03/19 1837 121/81     Pulse Rate 12/03/19 1837 (!) 110     Resp 12/03/19 1837 20     Temp 12/03/19 1837 99.4 F (37.4 C)     Temp src --      SpO2 12/03/19 1837 100 %     Weight --      Height --      Head Circumference --      Peak Flow --      Pain Score 12/03/19 1841 0     Pain Loc --      Pain Edu? --      Excl. in [place]? --    No data found.  Updated Vital Signs BP 121/81   Pulse (!) 110   Temp 99.4 F (37.4 C)   Resp 20   SpO2 100%   Visual Acuity Right Eye Distance:   Left Eye Distance:   Bilateral Distance:    Right Eye Near:   Left Eye Near:    Bilateral Near:     Physical Exam Vitals and nursing note reviewed.  Constitutional:      General: She is not in acute distress.    Appearance: She is well-developed.  HENT:     Head: Normocephalic and atraumatic.     Ears:     Comments: Bilateral ears without tenderness to palpation of external auricle, tragus and mastoid, EAC's without erythema or swelling, TM's with good bony landmarks and cone of light. Non erythematous.     Mouth/Throat:     Comments: Oral mucosa pink and moist, no tonsillar enlargement or exudate. Posterior pharynx patent and nonerythematous, no uvula deviation or swelling. Normal phonation. Eyes:     Conjunctiva/sclera: Conjunctivae normal.    Cardiovascular:     Rate and Rhythm: Regular rhythm. Tachycardia present.     Heart sounds: No murmur.     Comments: Initially tachycardic, improved to 95-100 Pulmonary:     Effort: Pulmonary effort is normal. No respiratory distress.     Breath sounds: Normal  breath sounds.     Comments: Breathing comfortably at rest, CTABL, no wheezing, rales or other adventitious sounds auscultated  No anterior chest tenderness Abdominal:     Palpations: Abdomen is soft.     Tenderness: There is no abdominal tenderness.  Musculoskeletal:     Cervical back: Neck supple.  Skin:    General: Skin is warm and dry.  Neurological:     Mental Status: She is alert.     Comments: Patient A&O x3, cranial nerves II-XII grossly intact, strength at shoulders, hips and knees 5/5, equal bilaterally, patellar reflex 2+ bilaterally. Gait without abnormality.       UC Treatments / Results  Labs (all labs ordered are listed, but only abnormal results are displayed) Labs Reviewed  CERVICOVAGINAL ANCILLARY ONLY    EKG   Radiology No results found.  Procedures Procedures (including critical care time)  Medications Ordered in UC Medications - No data to display  Initial Impression / Assessment and Plan / UC Course  I have reviewed the triage vital signs and the nursing notes.  Pertinent labs & imaging results that were available during my care of the patient were reviewed by me and considered in my medical decision making (see chart for details).  Clinical Course as of Dec 03 1002  Fri Dec 03, 2019  1930 HR recheck 95   [HW]    Clinical Course User Index [HW] Hanin Decook C, PA-C    EKG normal sinus tachy, nonspecific t wave inversions in various leads, similar to prior EKG. currently asymptomatic.  Will have follow-up with cardiology outpatient.  Initially tachycardic, otherwise negative risk factors for PE.  Advised to continue to monitor breathing, denies returning or developing increased  difficulty breathing, chest pain to follow-up in the emergency room.  Vaginal swab obtained to screen for gonorrhea, chlamydia and trichomonas.  Discussed utility of HSV antibody test and recommended not pursuing this testing due to false positives.  Recommended returning for follow-up if developing any concerning lesions to perform HSV culture.  Discussed strict return precautions. Patient verbalized understanding and is agreeable with plan.  Final Clinical Impressions(s) / UC Diagnoses   Final diagnoses:  Atypical chest pain  Screen for STD (sexually transmitted disease)     Discharge Instructions     Continue blood pressure medicine Follow up with cardiology outpatient for further evaluation of your heart If you develop persistent or worsening chest pain, shortness of breath, difficulty breathing, dizziness, lightheadedness, vision changes, weakness follow up in emergency room  We are testing you for Gonorrhea, Chlamydia, Trichomonas, Yeast and Bacterial Vaginosis. We will call you if anything is positive and let you know if you require any further treatment. Please inform partners of any positive results.     ED Prescriptions    None     PDMP not reviewed this encounter.   Janith Lima, PA-C 12/04/19 1014

## 2019-12-08 LAB — CERVICOVAGINAL ANCILLARY ONLY
Bacterial vaginitis: NEGATIVE
Candida vaginitis: NEGATIVE
Chlamydia: NEGATIVE
Neisseria Gonorrhea: NEGATIVE
Trichomonas: NEGATIVE

## 2020-03-06 ENCOUNTER — Ambulatory Visit (INDEPENDENT_AMBULATORY_CARE_PROVIDER_SITE_OTHER): Payer: Medicaid Other

## 2020-03-06 ENCOUNTER — Other Ambulatory Visit: Payer: Self-pay

## 2020-03-06 VITALS — BP 129/91 | HR 80

## 2020-03-06 DIAGNOSIS — Z3202 Encounter for pregnancy test, result negative: Secondary | ICD-10-CM | POA: Diagnosis not present

## 2020-03-06 DIAGNOSIS — Z3042 Encounter for surveillance of injectable contraceptive: Secondary | ICD-10-CM

## 2020-03-06 LAB — POCT PREGNANCY, URINE: Preg Test, Ur: NEGATIVE

## 2020-03-06 MED ORDER — MEDROXYPROGESTERONE ACETATE 150 MG/ML IM SUSP
150.0000 mg | Freq: Once | INTRAMUSCULAR | Status: AC
  Administered 2020-03-06: 150 mg via INTRAMUSCULAR

## 2020-03-06 NOTE — Progress Notes (Signed)
Sara Walsh here for Depo-Provera  Injection.  Injection administered without complication. Patient will return in 3 months for next injection.  Pt was due for Depo Provera between April 9- 23.  Pt reports last intercourse unprotected about a week ago.  Pt reports that she does not get a period because she is on Depo Provera.  Pregnancy test negative.  Notified Dr. Rosana Hoes who states that it is okay to give Depo Provera today and abstain or use protection for the next two weeks.  I advised pt that we will schedule her for her annual exam and a separate appt for her Depo Provera.  I also advised pt to reach out to her PCP about her elevated BP.  Pt verbalized understanding with no further questions.   Verdell Carmine, RN 03/06/2020  10:11 AM

## 2020-03-06 NOTE — Progress Notes (Signed)
I have reviewed this chart and agree with the RN/CMA assessment and management. Pt out of window by 2 weeks, strongly desirous of contraception, no harm to fetus in the unlikely event of early pregnancy.   Feliz Beam, M.D. Attending Center for [employer])

## 2020-03-09 ENCOUNTER — Encounter: Payer: Self-pay | Admitting: Obstetrics and Gynecology

## 2020-03-09 ENCOUNTER — Other Ambulatory Visit (HOSPITAL_COMMUNITY)
Admission: RE | Admit: 2020-03-09 | Discharge: 2020-03-09 | Disposition: A | Discharge status: Home / Self Care | Payer: Medicaid Other | Source: Ambulatory Visit | Attending: Obstetrics and Gynecology | Admitting: Obstetrics and Gynecology

## 2020-03-09 ENCOUNTER — Other Ambulatory Visit: Payer: Self-pay

## 2020-03-09 ENCOUNTER — Ambulatory Visit (INDEPENDENT_AMBULATORY_CARE_PROVIDER_SITE_OTHER): Payer: Medicaid Other | Admitting: Obstetrics and Gynecology

## 2020-03-09 VITALS — BP 130/91 | HR 76 | Temp 98.5°F | Ht 64.0 in | Wt 244.6 lb

## 2020-03-09 DIAGNOSIS — B9689 Other specified bacterial agents as the cause of diseases classified elsewhere: Secondary | ICD-10-CM

## 2020-03-09 DIAGNOSIS — Z3042 Encounter for surveillance of injectable contraceptive: Secondary | ICD-10-CM | POA: Diagnosis not present

## 2020-03-09 DIAGNOSIS — Z0000 Encounter for general adult medical examination without abnormal findings: Secondary | ICD-10-CM | POA: Diagnosis not present

## 2020-03-09 DIAGNOSIS — Z01419 Encounter for gynecological examination (general) (routine) without abnormal findings: Secondary | ICD-10-CM | POA: Insufficient documentation

## 2020-03-09 DIAGNOSIS — N760 Acute vaginitis: Secondary | ICD-10-CM | POA: Diagnosis not present

## 2020-03-09 LAB — POCT URINALYSIS DIP (DEVICE)
Bilirubin Urine: NEGATIVE
Glucose, UA: NEGATIVE mg/dL
Leukocytes,Ua: NEGATIVE
Nitrite: NEGATIVE
Protein, ur: NEGATIVE mg/dL
Specific Gravity, Urine: 1.025 (ref 1.005–1.030)
Urobilinogen, UA: 0.2 mg/dL (ref 0.0–1.0)
pH: 6.5 (ref 5.0–8.0)

## 2020-03-09 MED ORDER — BORIC ACID POWD: 600.0000 mg | Freq: Every day | 0 refills | Status: DC

## 2020-03-09 MED ORDER — TINIDAZOLE 500 MG PO TABS: 1.0000 g | ORAL_TABLET | Freq: Every day | ORAL | 0 refills | Status: DC

## 2020-03-09 NOTE — Progress Notes (Addendum)
GYNECOLOGY ANNUAL PREVENTATIVE CARE ENCOUNTER NOTE  History:     Sara Walsh is a 34 y.o. [id number] female here for a routine annual gynecologic exam.  Current complaints: recurrent BV. Denies abnormal vaginal bleeding, pelvic pain, problems with intercourse or other gynecologic concerns. Some dysuria and dark urine. No flank pain.   Gynecologic History No LMP recorded. Patient has had an injection. Contraception: Depo-Provera injections Last Pap: 05/2018. Results were: normal with negative HPV Last mammogram: NA  Obstetric History OB History  Gravida Para Term Preterm AB Living  11 5 4 1 6 5   SAB TAB Ectopic Multiple Live Births  1 5 0 0 5    # Outcome Date GA Lbr Len/2nd Weight Sex Delivery Anes PTL Lv  11 Preterm 11/27/18 [redacted]w[redacted]d  4 lb 9 oz (2.07 kg) F CS-LTranv Spinal  LIV  10 Term 05/11/12 [redacted]w[redacted]d   M CS-Unspec   LIV  9 TAB 03/23/09          8 Term 01/07/07 [redacted]w[redacted]d   F CS-Unspec   LIV  7 Term 07/24/05 [redacted]w[redacted]d   F CS-Unspec   LIV  6 Term 05/05/02 [redacted]w[redacted]d   F CS-Unspec   LIV  5 SAB           4 TAB           3 TAB           2 TAB              Birth Comments: System Generated. Please review and update pregnancy details.  1 TAB             Past Medical History:  Diagnosis Date  . Chlamydia   . Fibroid   . Gestational diabetes   . Hypertension   . Obesity   . Scabies   . Thyroid disease   . Trichomonas infection     Past Surgical History:  Procedure Laterality Date  . CESAREAN SECTION     C/S x 5  . CESAREAN SECTION WITH BILATERAL TUBAL LIGATION N/A 11/27/2018   Procedure: CESAREAN SECTION;  Surgeon: Donnamae Jude, MD;  Location: [facility];  Service: Obstetrics;  Laterality: N/A;  . WISDOM TOOTH EXTRACTION      Current Outpatient Medications on File Prior to Visit  Medication Sig Dispense Refill  . amLODipine (NORVASC) 10 MG tablet Take 1 tablet (10 mg total) by mouth daily. (Patient not taking: Reported on 03/06/2020) 30 tablet 1  . cyclobenzaprine  (FLEXERIL) 10 MG tablet Take 1 tablet (10 mg total) by mouth 2 (two) times daily as needed for muscle spasms. (Patient not taking: Reported on 05/18/2019) 20 tablet 0  . ergocalciferol (VITAMIN D2) 1.25 MG (50000 UT) capsule Take 50,000 Units by mouth once a week.    Marland Kitchen ibuprofen (ADVIL) 200 MG tablet Take 800 mg by mouth every 6 (six) hours as needed for moderate pain.     No current facility-administered medications on file prior to visit.    No Known Allergies  Social History:  reports that she quit smoking about 8 years ago. Her smoking use included cigars and cigarettes. She has never used smokeless tobacco. She reports current drug use. Drug: Marijuana. She reports that she does not drink alcohol.  Family History  Problem Relation Age of Onset  . Hypertension Father   . Hypertension Mother   . Hypertension Sister   . Anesthesia problems Neg Hx     The following portions of the  patient's history were reviewed and updated as appropriate: allergies, current medications, past family history, past medical history, past social history, past surgical history and problem list.  Review of Systems Pertinent items noted in HPI and remainder of comprehensive ROS otherwise negative.  Physical Exam:  BP (!) 130/91 (BP Location: Right Arm, Patient Position: Sitting, Walsh Size: Large)   Pulse 76   Temp 98.5 F (36.9 C)   Ht 5\' 4"  (1.626 m)   Wt 244 lb 9.6 oz (110.9 kg)   BMI 41.99 kg/m  CONSTITUTIONAL: Well-developed, well-nourished female in no acute distress.  HENT:  Normocephalic, atraumatic, External right and left ear normal. Oropharynx is clear and moist EYES: Conjunctivae and EOM are normal. Pupils are equal, round, and reactive to light. No scleral icterus.  NECK: Normal range of motion, supple, no masses.  Normal thyroid.  SKIN: Skin is warm and dry. No rash noted. Not diaphoretic. No erythema. No pallor. MUSCULOSKELETAL: Normal range of motion. No tenderness.  No cyanosis,  clubbing, or edema.  2+ distal pulses. NEUROLOGIC: Alert and oriented to person, place, and time. Normal reflexes, muscle tone coordination.  PSYCHIATRIC: Normal mood and affect. Normal behavior. Normal judgment and thought content. CARDIOVASCULAR: Normal heart rate noted, regular rhythm RESPIRATORY: Clear to auscultation bilaterally. Effort and breath sounds normal, no problems with respiration noted. BREASTS: Symmetric in size. No masses, tenderness, skin changes, nipple drainage, or lymphadenopathy bilaterally. Performed in the presence of a chaperone. ABDOMEN: Soft, no distention noted.  No tenderness, rebound or guarding.  PELVIC: Normal appearing external genitalia and urethral meatus; normal appearing vaginal mucosa and cervix.  White vaginal discharge noted.  Pap smear. Strong odor. Normal uterine size, no other palpable masses, no uterine or adnexal tenderness.  Performed in the presence of a chaperone.   Assessment and Plan:   1. Surveillance for Depo-Provera contraception  2. Women's annual routine gynecological examination  - Pap not indicated- last pap 2019- normal  - Cervicovaginal ancillary only( [place]) - Urine Culture - Signed patient up for Covid vaccine on 5/19  3. BV (bacterial vaginosis)  Recurrent, more than 5 episodes in year Rx: Tindamax X 7 days and boric acid X 21 days. F/u in office following treatment.  Boric acid is vaginal insertion only. No oral ingestion. Hide medication from children.   Routine preventative health maintenance measures emphasized. Please refer to After Visit Summary for other counseling recommendations.    Asees Manfredi, Artist Pais, [place] for [employer], [place]

## 2020-03-09 NOTE — Progress Notes (Signed)
Pt reports having a "pressure" feeling for more than 1 week when she pees and the urine is dark. Also states she feels that her rectum is sore and swollen. She denies bleeding from rectum or constipation.  Pt states she has not had BTS as indicated in her medical history.

## 2020-03-10 LAB — URINE CULTURE

## 2020-03-13 LAB — CERVICOVAGINAL ANCILLARY ONLY
Bacterial Vaginitis (gardnerella): POSITIVE — AB
Candida Glabrata: NEGATIVE
Candida Vaginitis: NEGATIVE
Chlamydia: NEGATIVE
Comment: NEGATIVE
Comment: NEGATIVE
Comment: NEGATIVE
Comment: NEGATIVE
Comment: NEGATIVE
Comment: NORMAL
Neisseria Gonorrhea: NEGATIVE
Trichomonas: NEGATIVE

## 2020-05-22 ENCOUNTER — Ambulatory Visit (INDEPENDENT_AMBULATORY_CARE_PROVIDER_SITE_OTHER): Payer: Medicaid Other | Admitting: General Practice

## 2020-05-22 ENCOUNTER — Other Ambulatory Visit: Payer: Self-pay

## 2020-05-22 VITALS — BP 132/93 | HR 84 | Ht 64.0 in | Wt 238.0 lb

## 2020-05-22 DIAGNOSIS — Z3042 Encounter for surveillance of injectable contraceptive: Secondary | ICD-10-CM | POA: Diagnosis not present

## 2020-05-22 MED ORDER — MEDROXYPROGESTERONE ACETATE 150 MG/ML IM SUSP
150.0000 mg | Freq: Once | INTRAMUSCULAR | Status: AC
  Administered 2020-05-22: 150 mg via INTRAMUSCULAR

## 2020-05-22 NOTE — Progress Notes (Signed)
Sara Walsh here for Depo-Provera  Injection.  Injection administered without complication. Patient will return in 3 months for next injection. Patient reports constant vaginal odor and was unable to complete last medication due to yeast infection 3 days into taking. Told patient I would reach out to [employer]. Patient verbalized understanding.  Derinda Late, RN 05/22/2020  10:58 AM

## 2020-05-22 NOTE — Progress Notes (Signed)
Patient was assessed and managed by nursing staff during this encounter. I have reviewed the chart and agree with the documentation and plan. I have also made any necessary editorial changes.  Verita Schneiders, MD 05/22/2020 12:22 PM

## 2020-08-07 ENCOUNTER — Other Ambulatory Visit: Payer: Self-pay

## 2020-08-07 ENCOUNTER — Other Ambulatory Visit (HOSPITAL_COMMUNITY)
Admission: RE | Admit: 2020-08-07 | Discharge: 2020-08-07 | Disposition: A | Discharge status: Home / Self Care | Payer: Medicaid Other | Source: Ambulatory Visit | Attending: Family Medicine | Admitting: Family Medicine

## 2020-08-07 ENCOUNTER — Ambulatory Visit (INDEPENDENT_AMBULATORY_CARE_PROVIDER_SITE_OTHER): Payer: Medicaid Other | Admitting: *Deleted

## 2020-08-07 VITALS — BP 119/75 | HR 83 | Ht 64.0 in | Wt 243.0 lb

## 2020-08-07 DIAGNOSIS — N898 Other specified noninflammatory disorders of vagina: Secondary | ICD-10-CM | POA: Diagnosis not present

## 2020-08-07 DIAGNOSIS — Z3042 Encounter for surveillance of injectable contraceptive: Secondary | ICD-10-CM

## 2020-08-07 DIAGNOSIS — Z202 Contact with and (suspected) exposure to infections with a predominantly sexual mode of transmission: Secondary | ICD-10-CM | POA: Diagnosis present

## 2020-08-07 MED ORDER — MEDROXYPROGESTERONE ACETATE 150 MG/ML IM SUSP
150.0000 mg | Freq: Once | INTRAMUSCULAR | Status: AC
  Administered 2020-08-07: 150 mg via INTRAMUSCULAR

## 2020-08-07 NOTE — Progress Notes (Signed)
Sara Walsh here for Depo-Provera Injection. Injection administered without complication. Patient will return in 3 months for next injection between 10/23/20 and 11/06/2020. Next annual visit due 09/2020.  She also asked if she can be screened for std also today. She reports vaginal itching and brown vaginal itching. Will do self swab.  Darris Staiger,RN 08/07/2020  10:29 AM

## 2020-08-08 LAB — CERVICOVAGINAL ANCILLARY ONLY
Bacterial Vaginitis (gardnerella): POSITIVE — AB
Candida Glabrata: NEGATIVE
Candida Vaginitis: NEGATIVE
Chlamydia: NEGATIVE
Comment: NEGATIVE
Comment: NEGATIVE
Comment: NEGATIVE
Comment: NEGATIVE
Comment: NEGATIVE
Comment: NORMAL
Neisseria Gonorrhea: NEGATIVE
Trichomonas: NEGATIVE

## 2020-08-10 ENCOUNTER — Other Ambulatory Visit: Payer: Self-pay | Admitting: Obstetrics and Gynecology

## 2020-08-10 NOTE — Progress Notes (Signed)
Patient was assessed and managed by nursing staff during this encounter. I have reviewed the chart and agree with the documentation and plan. I have also made any necessary editorial changes.  Aletha Halim, MD 08/10/2020 9:39 PM

## 2020-09-25 ENCOUNTER — Encounter: Payer: Self-pay | Admitting: Advanced Practice Midwife

## 2020-09-25 ENCOUNTER — Other Ambulatory Visit: Payer: Self-pay

## 2020-09-25 ENCOUNTER — Ambulatory Visit (INDEPENDENT_AMBULATORY_CARE_PROVIDER_SITE_OTHER): Payer: Medicaid Other | Admitting: Advanced Practice Midwife

## 2020-09-25 ENCOUNTER — Other Ambulatory Visit (HOSPITAL_COMMUNITY)
Admission: RE | Admit: 2020-09-25 | Discharge: 2020-09-25 | Disposition: A | Discharge status: Home / Self Care | Payer: Medicaid Other | Source: Ambulatory Visit | Attending: Advanced Practice Midwife | Admitting: Advanced Practice Midwife

## 2020-09-25 VITALS — BP 115/81 | HR 87 | Wt 245.3 lb

## 2020-09-25 DIAGNOSIS — B9689 Other specified bacterial agents as the cause of diseases classified elsewhere: Secondary | ICD-10-CM | POA: Insufficient documentation

## 2020-09-25 DIAGNOSIS — N760 Acute vaginitis: Secondary | ICD-10-CM

## 2020-09-25 DIAGNOSIS — Z113 Encounter for screening for infections with a predominantly sexual mode of transmission: Secondary | ICD-10-CM | POA: Diagnosis not present

## 2020-09-25 DIAGNOSIS — Z01419 Encounter for gynecological examination (general) (routine) without abnormal findings: Secondary | ICD-10-CM | POA: Diagnosis not present

## 2020-09-25 MED ORDER — FLUCONAZOLE 150 MG PO TABS: 150.0000 mg | ORAL_TABLET | Freq: Once | ORAL | 0 refills | Status: AC

## 2020-09-25 MED ORDER — BORIC ACID POWD: 600.0000 mg | Freq: Every day | 0 refills | Status: DC

## 2020-09-25 MED ORDER — METRONIDAZOLE 500 MG PO TABS: 500.0000 mg | ORAL_TABLET | Freq: Two times a day (BID) | ORAL | 0 refills | Status: DC

## 2020-09-25 NOTE — Progress Notes (Signed)
GYNECOLOGY PROBLEM VISIT NOTE  History:     Sara Walsh is a 34 y.o. [id number] female here for evaluation of recurrent Bacterial Vaginosis.   Denies abnormal vaginal bleeding, discharge, pelvic pain, problems with intercourse or other gynecologic concerns.    Patient has not initiated Boric Acid treatment prescribed in May 2021. Was unsure how to incorporate into other medicines. Not pleased with Tindamax, severe rebound yeast.   Gynecologic History No LMP recorded. Patient has had an injection. Contraception: Depo-Provera injections Last Pap: 05/2018. Results were: normal with negative HPV   Obstetric History OB History  Gravida Para Term Preterm AB Living  11 5 4 1 6 5   SAB TAB Ectopic Multiple Live Births  1 5 0 0 5    # Outcome Date GA Lbr Len/2nd Weight Sex Delivery Anes PTL Lv  11 Preterm 11/27/18 [redacted]w[redacted]d  4 lb 9 oz (2.07 kg) F CS-LTranv Spinal  LIV  10 Term 05/11/12 [redacted]w[redacted]d   M CS-Unspec   LIV  9 TAB 03/23/09          8 Term 01/07/07 [redacted]w[redacted]d   F CS-Unspec   LIV  7 Term 07/24/05 [redacted]w[redacted]d   F CS-Unspec   LIV  6 Term 05/05/02 [redacted]w[redacted]d   F CS-Unspec   LIV  5 SAB           4 TAB           3 TAB           2 TAB              Birth Comments: System Generated. Please review and update pregnancy details.  1 TAB             Past Medical History:  Diagnosis Date  . Chlamydia   . Fibroid   . Gestational diabetes   . Hypertension   . Obesity   . Scabies   . Thyroid disease   . Trichomonas infection     Past Surgical History:  Procedure Laterality Date  . CESAREAN SECTION     C/S x 5  . CESAREAN SECTION WITH BILATERAL TUBAL LIGATION N/A 11/27/2018   Procedure: CESAREAN SECTION;  Surgeon: Donnamae Jude, MD;  Location: [facility];  Service: Obstetrics;  Laterality: N/A;  . WISDOM TOOTH EXTRACTION      Current Outpatient Medications on File Prior to Visit  Medication Sig Dispense Refill  . amLODipine (NORVASC) 2.5 MG tablet Take 2.5 mg by mouth daily.    .  cyclobenzaprine (FLEXERIL) 10 MG tablet Take 1 tablet (10 mg total) by mouth 2 (two) times daily as needed for muscle spasms. (Patient not taking: Reported on 05/18/2019) 20 tablet 0  . ibuprofen (ADVIL) 200 MG tablet Take 800 mg by mouth every 6 (six) hours as needed for moderate pain. (Patient not taking: Reported on 09/25/2020)     No current facility-administered medications on file prior to visit.    No Known Allergies  Social History:  reports that she quit smoking about 9 years ago. Her smoking use included cigars and cigarettes. She has never used smokeless tobacco. She reports current drug use. Drug: Marijuana. She reports that she does not drink alcohol.  Family History  Problem Relation Age of Onset  . Hypertension Father   . Hypertension Mother   . Hypertension Sister   . Anesthesia problems Neg Hx     The following portions of the patient's history were reviewed and updated as appropriate: allergies,  current medications, past family history, past medical history, past social history, past surgical history and problem list.  Review of Systems Pertinent items noted in HPI and remainder of comprehensive ROS otherwise negative.  Physical Exam:  BP 115/81   Pulse 87   Wt 245 lb 4.8 oz (111.3 kg)   Breastfeeding No   BMI 42.11 kg/m  CONSTITUTIONAL: Well-developed, well-nourished female in no acute distress.  HENT:  Normocephalic, atraumatic, External right and left ear normal.  EYES: Conjunctivae and EOM are normal. Pupils are equal, round, and reactive to light. No scleral icterus.  NECK: Normal range of motion, supple, no masses.  Normal thyroid.  SKIN: Skin is warm and dry. No rash noted. Not diaphoretic. No erythema. No pallor. MUSCULOSKELETAL: Normal range of motion. No tenderness.  No cyanosis, clubbing, or edema.   NEUROLOGIC: Alert and oriented to person, place, and time. Normal reflexes, muscle tone coordination.  PSYCHIATRIC: Normal mood and affect. Normal  behavior. Normal judgment and thought content. CARDIOVASCULAR: Normal heart rate noted, regular rhythm RESPIRATORY: Clear to auscultation bilaterally. Effort and breath sounds normal, no problems with respiration noted. BREASTS: Symmetric in size. No masses, tenderness, skin changes, nipple drainage, or lymphadenopathy bilaterally. Performed in the presence of a chaperone. ABDOMEN: Soft, no distention noted.  No tenderness, rebound or guarding.  PELVIC: Normal appearing external genitalia and urethral meatus; normal appearing vaginal mucosa and cervix.  Thin white discharge, fishy odor consistent with recurrent BV.  Pap smear obtained.  Normal uterine size, no other palpable masses, no uterine or adnexal tenderness.  Performed in the presence of a chaperone.   Assessment and Plan:    1. Screening examination for STD (sexually transmitted disease)  - RPR - HIV Antibody (routine testing w rflx) - Hepatitis C Antibody - Hepatitis B Surface AntiGEN  2. BV (bacterial vaginosis) - Treatment for > 5 episodes in one year as previously prescribed - Take first Diflucan, then treat BV, then take second Diflucan PRN - Boric Acid POWD; Place 600 mg vaginally daily. At bedtime. Start on the same day as tindamax and continue for 21 days.  Dispense: 12000 g; Refill: 0 - Cervicovaginal ancillary only( [place])  3. Well woman exam with routine gynecological exam - Cytology - PAP( [facility]) - Cervicovaginal ancillary only( [facility])  Will follow up results of pap smear and manage accordingly. Routine preventative health maintenance measures emphasized. Please refer to After Visit Summary for other counseling recommendations.      Mallie Snooks, MSN, CNM Certified Nurse Midwife, [employer] for [employer], [place] 09/25/20 3:01 PM

## 2020-09-26 LAB — CERVICOVAGINAL ANCILLARY ONLY
Bacterial Vaginitis (gardnerella): POSITIVE — AB
Candida Glabrata: NEGATIVE
Candida Vaginitis: NEGATIVE
Comment: NEGATIVE
Comment: NEGATIVE
Comment: NEGATIVE

## 2020-09-26 LAB — HIV ANTIBODY (ROUTINE TESTING W REFLEX): HIV Screen 4th Generation wRfx: NONREACTIVE

## 2020-09-26 LAB — RPR: RPR Ser Ql: NONREACTIVE

## 2020-09-26 LAB — HEPATITIS B SURFACE ANTIGEN: Hepatitis B Surface Ag: NEGATIVE

## 2020-09-26 LAB — HEPATITIS C ANTIBODY: Hep C Virus Ab: <0.1 s/co ratio (ref 0.0–0.9)

## 2020-09-27 LAB — CYTOLOGY - PAP
Comment: NEGATIVE
Diagnosis: NEGATIVE
High risk HPV: NEGATIVE

## 2020-10-23 ENCOUNTER — Encounter: Payer: Self-pay | Admitting: Lactation Services

## 2020-10-23 ENCOUNTER — Other Ambulatory Visit: Payer: Self-pay

## 2020-10-23 ENCOUNTER — Ambulatory Visit (INDEPENDENT_AMBULATORY_CARE_PROVIDER_SITE_OTHER): Payer: Medicaid Other | Admitting: Lactation Services

## 2020-10-23 VITALS — BP 128/84 | HR 86 | Ht 64.0 in | Wt 233.8 lb

## 2020-10-23 DIAGNOSIS — Z3042 Encounter for surveillance of injectable contraceptive: Secondary | ICD-10-CM | POA: Diagnosis not present

## 2020-10-23 MED ORDER — MEDROXYPROGESTERONE ACETATE 150 MG/ML IM SUSP
150.0000 mg | Freq: Once | INTRAMUSCULAR | Status: AC
  Administered 2020-10-23: 150 mg via INTRAMUSCULAR

## 2020-10-23 NOTE — Progress Notes (Signed)
Sara Walsh here for Depo-Provera Injection. Injection administered without complication. Patient will return in 3 months for next injection between March 14 and January 22, 2021. Next annual visit due 09/25/2021.   Ed Blalock, RN 10/23/2020  10:29 AM

## 2020-10-23 NOTE — Progress Notes (Signed)
Patient was assessed and managed by nursing staff during this encounter. I have reviewed the chart and agree with the documentation and plan. I have also made any necessary editorial changes.  Jaynie Collins, MD 10/23/2020 1:18 PM

## 2021-01-19 ENCOUNTER — Other Ambulatory Visit: Payer: Self-pay

## 2021-01-19 ENCOUNTER — Encounter: Payer: Self-pay | Admitting: *Deleted

## 2021-01-19 ENCOUNTER — Ambulatory Visit (INDEPENDENT_AMBULATORY_CARE_PROVIDER_SITE_OTHER): Payer: Medicaid Other | Admitting: *Deleted

## 2021-01-19 VITALS — BP 135/96 | HR 87 | Ht 64.0 in | Wt 227.6 lb

## 2021-01-19 DIAGNOSIS — Z3042 Encounter for surveillance of injectable contraceptive: Secondary | ICD-10-CM

## 2021-01-19 MED ORDER — MEDROXYPROGESTERONE ACETATE 150 MG/ML IM SUSP
150.0000 mg | Freq: Once | INTRAMUSCULAR | Status: AC
  Administered 2021-01-19: 150 mg via INTRAMUSCULAR

## 2021-01-19 NOTE — Progress Notes (Signed)
Patient was assessed and managed by nursing staff during this encounter. I have reviewed the chart and agree with the documentation and plan.   Kerry Hough, PA-C 01/19/2021 11:56 AM

## 2021-01-19 NOTE — Progress Notes (Signed)
Depo Provera 150 mg IM administered as scheduled. Pt tolerated well. Next dose due 6/  Last Pap done 09/25/20 - normal. Next Annual Gyn exam due after 09/25/21.  Pt voiced understanding of all information given.

## 2021-02-06 ENCOUNTER — Other Ambulatory Visit: Payer: Self-pay

## 2021-02-06 ENCOUNTER — Emergency Department (HOSPITAL_COMMUNITY): Payer: Medicaid Other

## 2021-02-06 ENCOUNTER — Emergency Department (HOSPITAL_COMMUNITY)
Admission: EM | Admit: 2021-02-06 | Discharge: 2021-02-06 | Disposition: A | Discharge status: Home / Self Care | Payer: Medicaid Other | Attending: Emergency Medicine | Admitting: Emergency Medicine

## 2021-02-06 ENCOUNTER — Encounter (HOSPITAL_COMMUNITY): Payer: Self-pay

## 2021-02-06 DIAGNOSIS — S161XXA Strain of muscle, fascia and tendon at neck level, initial encounter: Secondary | ICD-10-CM | POA: Diagnosis not present

## 2021-02-06 DIAGNOSIS — Z87891 Personal history of nicotine dependence: Secondary | ICD-10-CM | POA: Diagnosis not present

## 2021-02-06 DIAGNOSIS — Y92410 Unspecified street and highway as the place of occurrence of the external cause: Secondary | ICD-10-CM | POA: Insufficient documentation

## 2021-02-06 DIAGNOSIS — R0789 Other chest pain: Secondary | ICD-10-CM | POA: Diagnosis not present

## 2021-02-06 DIAGNOSIS — S199XXA Unspecified injury of neck, initial encounter: Secondary | ICD-10-CM | POA: Diagnosis present

## 2021-02-06 DIAGNOSIS — I10 Essential (primary) hypertension: Secondary | ICD-10-CM | POA: Diagnosis not present

## 2021-02-06 MED ORDER — IBUPROFEN 800 MG PO TABS: 800.0000 mg | ORAL_TABLET | Freq: Four times a day (QID) | ORAL | 0 refills | Status: DC | PRN

## 2021-02-06 MED ORDER — DICLOFENAC SODIUM 1 % EX GEL
2.0000 g | Freq: Four times a day (QID) | CUTANEOUS | 0 refills | Status: DC
Start: 2021-02-06 — End: 2022-03-05

## 2021-02-06 MED ORDER — METHOCARBAMOL 500 MG PO TABS: 500.0000 mg | ORAL_TABLET | Freq: Four times a day (QID) | ORAL | 0 refills | Status: DC | PRN
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
## 2021-03-16 ENCOUNTER — Encounter (HOSPITAL_COMMUNITY): Payer: Self-pay | Admitting: Emergency Medicine

## 2021-03-16 ENCOUNTER — Ambulatory Visit (HOSPITAL_COMMUNITY)
Admission: EM | Admit: 2021-03-16 | Discharge: 2021-03-16 | Disposition: A | Discharge status: Home / Self Care | Payer: Medicaid Other | Attending: Medical Oncology | Admitting: Medical Oncology

## 2021-03-16 ENCOUNTER — Other Ambulatory Visit: Payer: Self-pay

## 2021-03-16 DIAGNOSIS — J069 Acute upper respiratory infection, unspecified: Secondary | ICD-10-CM | POA: Insufficient documentation

## 2021-03-16 DIAGNOSIS — J029 Acute pharyngitis, unspecified: Secondary | ICD-10-CM | POA: Diagnosis present

## 2021-03-16 MED ORDER — BENZONATATE 100 MG PO CAPS: 100.0000 mg | ORAL_CAPSULE | Freq: Three times a day (TID) | ORAL | 0 refills | Status: DC

## 2021-03-16 MED ORDER — FLUTICASONE PROPIONATE 50 MCG/ACT NA SUSP: 2.0000 | Freq: Every day | NASAL | 0 refills | Status: DC

## 2021-03-16 NOTE — ED Provider Notes (Signed)
[facility]    CSN: [id number] Arrival date & time: 03/16/21  1946      History   Chief Complaint Chief Complaint  Patient presents with  . Sore Throat  . Cough    HPI Sara Walsh is a 35 y.o. female.   HPI   Cough: Patient presents with a dry cough and sore throat for the past 14 days.  She denies any chest pain, fever, shortness of breath.  She would like to have throat culture and cytology of her throat as it has been going on for 2 weeks. No known exposures or sick contacts. She reports that she has not tried anything for symptoms.  Past Medical History:  Diagnosis Date  . Chlamydia   . Fibroid   . Gestational diabetes   . Hypertension   . Obesity   . Scabies   . Thyroid disease   . Trichomonas infection     Patient Active Problem List   Diagnosis Date Noted  . Surveillance for Depo-Provera contraception 03/09/2020  . Dyspareunia in female 09/29/2019  . History of uterine fibroid 09/29/2019  . History of gestational diabetes 12/30/2018  . Constipation 06/08/2018  . Essential hypertension 05/18/2018  . Uterine fibroid 01/08/2018  . BV (bacterial vaginosis) 01/08/2018    Past Surgical History:  Procedure Laterality Date  . CESAREAN SECTION     C/S x 5  . CESAREAN SECTION WITH BILATERAL TUBAL LIGATION N/A 11/27/2018   Procedure: CESAREAN SECTION;  Surgeon: Donnamae Jude, MD;  Location: [facility];  Service: Obstetrics;  Laterality: N/A;  . WISDOM TOOTH EXTRACTION      OB History    Gravida  11   Para  5   Term  4   Preterm  1   AB  6   Living  5     SAB  1   IAB  5   Ectopic  0   Multiple  0   Live Births  5            Home Medications    Prior to Admission medications   Medication Sig Start Date End Date Taking? Authorizing Provider  amLODipine (NORVASC) 2.5 MG tablet Take 2.5 mg by mouth daily. 07/05/20   [provider]  diclofenac Sodium (VOLTAREN) 1 % GEL Apply 2 g topically 4 (four)  times daily. 02/06/21   Long, Wonda Olds, MD  ibuprofen (ADVIL) 800 MG tablet Take 1 tablet (800 mg total) by mouth every 6 (six) hours as needed for moderate pain. 02/06/21   Long, Wonda Olds, MD  methocarbamol (ROBAXIN) 500 MG tablet Take 1 tablet (500 mg total) by mouth every 6 (six) hours as needed for muscle spasms. 02/06/21   Long, Wonda Olds, MD    Family History Family History  Problem Relation Age of Onset  . Hypertension Father   . Hypertension Mother   . Hypertension Sister   . Anesthesia problems Neg Hx     Social History Social History   Tobacco Use  . Smoking status: Former Smoker    Types: Cigars, Cigarettes    Quit date: 09/09/2011    Years since quitting: 9.5  . Smokeless tobacco: Never Used  Vaping Use  . Vaping Use: Never used  Substance Use Topics  . Alcohol use: No  . Drug use: Not Currently    Types: Marijuana    Comment: weekends occasionally     Allergies   Patient has no known allergies.  Review of Systems Review of Systems  As stated above in HPI Physical Exam Triage Vital Signs ED Triage Vitals [03/16/21 1952]  Enc Vitals Group     BP 136/87     Pulse Rate 81     Resp 18     Temp 99.1 F (37.3 C)     Temp Source Oral     SpO2 98 %     Weight      Height      Head Circumference      Peak Flow      Pain Score 0     Pain Loc      Pain Edu?      Excl. in [place]?    No data found.  Updated Vital Signs BP 136/87 (BP Location: Right Arm)   Pulse 81   Temp 99.1 F (37.3 C) (Oral)   Resp 18   SpO2 98%   Physical Exam Vitals and nursing note reviewed.  Constitutional:      General: She is not in acute distress.    Appearance: She is well-developed. She is obese. She is not ill-appearing, toxic-appearing or diaphoretic.  HENT:     Head: Normocephalic and atraumatic.     Right Ear: Tympanic membrane and ear canal normal. No drainage, swelling or tenderness. No middle ear effusion. Tympanic membrane is not erythematous.     Left Ear:  Tympanic membrane and ear canal normal. No drainage, swelling or tenderness.  No middle ear effusion. Tympanic membrane is not erythematous.     Nose: No congestion or rhinorrhea.     Mouth/Throat:     Mouth: Mucous membranes are moist. No oral lesions.     Pharynx: Oropharynx is clear. Uvula midline. No pharyngeal swelling, oropharyngeal exudate, posterior oropharyngeal erythema or uvula swelling.     Tonsils: No tonsillar exudate or tonsillar abscesses. 0 on the right. 0 on the left.  Cardiovascular:     Rate and Rhythm: Normal rate and regular rhythm.     Heart sounds: Normal heart sounds.  Pulmonary:     Effort: Pulmonary effort is normal.     Breath sounds: Normal breath sounds.  Musculoskeletal:     Cervical back: Normal range of motion and neck supple.  Lymphadenopathy:     Cervical: No cervical adenopathy.  Skin:    Findings: No rash.  Neurological:     Mental Status: She is alert and oriented to person, place, and time.      UC Treatments / Results  Labs (all labs ordered are listed, but only abnormal results are displayed) Labs Reviewed - No data to display  EKG   Radiology No results found.  Procedures Procedures (including critical care time)  Medications Ordered in UC Medications - No data to display  Initial Impression / Assessment and Plan / UC Course  I have reviewed the triage vital signs and the nursing notes.  Pertinent labs & imaging results that were available during my care of the patient were reviewed by me and considered in my medical decision making (see chart for details).     New.  Likely viral in nature.  She has no red flag signs and symptoms.  Treating with Flonase and Tessalon.  Per patient request we are going to perform a throat culture and cytology and treat as appropriate.  Rest and hydration along with Cepacol over-the-counter sore throat lozenges may be helpful too.  Final Clinical Impressions(s) / UC Diagnoses   Final diagnoses:  None   Discharge Instructions   None    ED Prescriptions    None     PDMP not reviewed this encounter.   Hughie Closs, [place] 03/16/21 2015

## 2021-03-16 NOTE — ED Notes (Signed)
Pt refused COVID swab

## 2021-03-19 LAB — CYTOLOGY, (ORAL, ANAL, URETHRAL) ANCILLARY ONLY
Chlamydia: NEGATIVE
Comment: NEGATIVE
Comment: NEGATIVE
Comment: NORMAL
Neisseria Gonorrhea: NEGATIVE
Trichomonas: NEGATIVE

## 2021-03-19 LAB — CULTURE, GROUP A STREP (THRC)

## 2021-05-11 ENCOUNTER — Emergency Department (HOSPITAL_COMMUNITY)
Admission: EM | Admit: 2021-05-11 | Discharge: 2021-05-12 | Disposition: A | Discharge status: Home / Self Care | Payer: Medicaid Other | Attending: Emergency Medicine | Admitting: Emergency Medicine

## 2021-05-11 ENCOUNTER — Other Ambulatory Visit: Payer: Self-pay

## 2021-05-11 DIAGNOSIS — N898 Other specified noninflammatory disorders of vagina: Secondary | ICD-10-CM | POA: Diagnosis present

## 2021-05-11 DIAGNOSIS — Z87891 Personal history of nicotine dependence: Secondary | ICD-10-CM | POA: Insufficient documentation

## 2021-05-11 DIAGNOSIS — Z79899 Other long term (current) drug therapy: Secondary | ICD-10-CM | POA: Diagnosis not present

## 2021-05-11 DIAGNOSIS — R1084 Generalized abdominal pain: Secondary | ICD-10-CM | POA: Insufficient documentation

## 2021-05-11 DIAGNOSIS — I10 Essential (primary) hypertension: Secondary | ICD-10-CM | POA: Diagnosis not present

## 2021-05-11 LAB — URINALYSIS, ROUTINE W REFLEX MICROSCOPIC
Bacteria, UA: NONE SEEN
Bilirubin Urine: NEGATIVE
Glucose, UA: NEGATIVE mg/dL
Hgb urine dipstick: NEGATIVE
Ketones, ur: NEGATIVE mg/dL
Nitrite: NEGATIVE
Protein, ur: NEGATIVE mg/dL
Specific Gravity, Urine: 1.017 (ref 1.005–1.030)
pH: 8 (ref 5.0–8.0)

## 2021-05-11 LAB — I-STAT BETA HCG BLOOD, ED (MC, WL, AP ONLY): I-stat hCG, quantitative: <5 m[IU]/mL (ref <5)

## 2021-05-11 LAB — CBC
HCT: 40.1 % (ref 36.0–46.0)
Hemoglobin: 12.6 g/dL (ref 12.0–15.0)
MCH: 23 pg — ABNORMAL LOW (ref 26.0–34.0)
MCHC: 31.4 g/dL (ref 30.0–36.0)
MCV: 73.3 fL — ABNORMAL LOW (ref 80.0–100.0)
Platelets: 299 10*3/uL (ref 150–400)
RBC: 5.47 MIL/uL — ABNORMAL HIGH (ref 3.87–5.11)
RDW: 16.4 % — ABNORMAL HIGH (ref 11.5–15.5)
WBC: 9.4 10*3/uL (ref 4.0–10.5)
nRBC: 0 % (ref 0.0–0.2)

## 2021-05-11 NOTE — ED Triage Notes (Signed)
Patient reports mid abdominal pain with nausea this week , no emesis or diarrhea , denies fever or chills , patient added vaginal itching and burning sensation today .

## 2021-05-12 LAB — COMPREHENSIVE METABOLIC PANEL
ALT: 13 U/L (ref 0–44)
AST: 19 U/L (ref 15–41)
Albumin: 3.2 g/dL — ABNORMAL LOW (ref 3.5–5.0)
Alkaline Phosphatase: 76 U/L (ref 38–126)
Anion gap: 7 (ref 5–15)
BUN: 12 mg/dL (ref 6–20)
CO2: 23 mmol/L (ref 22–32)
Calcium: 8.3 mg/dL — ABNORMAL LOW (ref 8.9–10.3)
Chloride: 109 mmol/L (ref 98–111)
Creatinine, Ser: 0.95 mg/dL (ref 0.44–1.00)
GFR, Estimated: >60 mL/min (ref >60)
Glucose, Bld: 111 mg/dL — ABNORMAL HIGH (ref 70–99)
Potassium: 3.5 mmol/L (ref 3.5–5.1)
Sodium: 139 mmol/L (ref 135–145)
Total Bilirubin: 0.8 mg/dL (ref 0.3–1.2)
Total Protein: 5.9 g/dL — ABNORMAL LOW (ref 6.5–8.1)

## 2021-05-12 LAB — WET PREP, GENITAL
Clue Cells Wet Prep HPF POC: NONE SEEN
Sperm: NONE SEEN
Trich, Wet Prep: NONE SEEN
Yeast Wet Prep HPF POC: NONE SEEN

## 2021-05-12 LAB — LIPASE, BLOOD: Lipase: 40 U/L (ref 11–51)

## 2021-05-12 MED ORDER — AZITHROMYCIN 250 MG PO TABS
1000.0000 mg | ORAL_TABLET | Freq: Once | ORAL | Status: AC
  Administered 2021-05-12: 1000 mg via ORAL
  Filled 2021-05-12: qty 4

## 2021-05-12 MED ORDER — CEFTRIAXONE SODIUM 500 MG IJ SOLR
500.0000 mg | Freq: Once | INTRAMUSCULAR | Status: AC
  Administered 2021-05-12: 500 mg via INTRAMUSCULAR
  Filled 2021-05-12: qty 500

## 2021-05-12 MED ORDER — HYOSCYAMINE SULFATE 0.125 MG SL SUBL: 0.1250 mg | SUBLINGUAL_TABLET | SUBLINGUAL | 0 refills | Status: DC | PRN

## 2021-05-12 MED ORDER — HYOSCYAMINE SULFATE 0.125 MG PO TABS: 0.2500 mg | ORAL_TABLET | Freq: Once | ORAL | Status: DC

## 2021-05-12 MED ORDER — LIDOCAINE HCL (PF) 1 % IJ SOLN
INTRAMUSCULAR | Status: AC
  Administered 2021-05-12: 1 mL
  Filled 2021-05-12: qty 5

## 2021-05-12 MED ORDER — ONDANSETRON HCL 4 MG PO TABS: 4.0000 mg | ORAL_TABLET | Freq: Four times a day (QID) | ORAL | 0 refills | Status: DC | PRN

## 2021-05-12 MED ORDER — HYOSCYAMINE SULFATE 0.125 MG SL SUBL
0.2500 mg | SUBLINGUAL_TABLET | Freq: Once | SUBLINGUAL | Status: AC
  Administered 2021-05-12: 0.25 mg via SUBLINGUAL
  Filled 2021-05-12: qty 2

## 2021-05-12 NOTE — ED Provider Notes (Signed)
[facility] EMERGENCY DEPARTMENT Provider Note   CSN: [id number] Arrival date & time: 05/11/21  2252     History Chief Complaint  Patient presents with   Abdominal Pain    Sara Walsh is a 35 y.o. female.  Patient presents to the emergency department for evaluation of mid abdominal pain.  Patient reports that the symptoms have really been present just today.  Patient reports a crampy pain which is a little better currently.  She did not have nausea, vomiting, diarrhea or constipation.  Has not had a fever.  She has noticed a little burning with urination as well as vaginal irritation and itching.      Past Medical History:  Diagnosis Date   Chlamydia    Fibroid    Gestational diabetes    Hypertension    Obesity    Scabies    Thyroid disease    Trichomonas infection     Patient Active Problem List   Diagnosis Date Noted   Surveillance for Depo-Provera contraception 03/09/2020   Dyspareunia in female 09/29/2019   History of uterine fibroid 09/29/2019   History of gestational diabetes 12/30/2018   Constipation 06/08/2018   Essential hypertension 05/18/2018   Uterine fibroid 01/08/2018   BV (bacterial vaginosis) 01/08/2018    Past Surgical History:  Procedure Laterality Date   CESAREAN SECTION     C/S x 5   CESAREAN SECTION WITH BILATERAL TUBAL LIGATION N/A 11/27/2018   Procedure: CESAREAN SECTION;  Surgeon: Donnamae Jude, MD;  Location: [facility];  Service: Obstetrics;  Laterality: N/A;   WISDOM TOOTH EXTRACTION       OB History     Gravida  26   Para  5   Term  4   Preterm  1   AB  6   Living  5      SAB  1   IAB  5   Ectopic  0   Multiple  0   Live Births  5           Family History  Problem Relation Age of Onset   Hypertension Father    Hypertension Mother    Hypertension Sister    Anesthesia problems Neg Hx     Social History   Tobacco Use   Smoking status: Former    Types: Cigars,  Cigarettes    Quit date: 09/09/2011    Years since quitting: 9.6   Smokeless tobacco: Never  Vaping Use   Vaping Use: Never used  Substance Use Topics   Alcohol use: No   Drug use: Not Currently    Types: Marijuana    Comment: weekends occasionally    Home Medications Prior to Admission medications   Medication Sig Start Date End Date Taking? Authorizing Provider  amLODipine (NORVASC) 2.5 MG tablet Take 2.5 mg by mouth daily. 07/05/20   [provider]  benzonatate (TESSALON) 100 MG capsule Take 1 capsule (100 mg total) by mouth every 8 (eight) hours. 03/16/21   Hughie Closs, PA-C  diclofenac Sodium (VOLTAREN) 1 % GEL Apply 2 g topically 4 (four) times daily. 02/06/21   Long, Wonda Olds, MD  fluticasone (FLONASE) 50 MCG/ACT nasal spray Place 2 sprays into both nostrils daily. 03/16/21   Hughie Closs, PA-C  ibuprofen (ADVIL) 800 MG tablet Take 1 tablet (800 mg total) by mouth every 6 (six) hours as needed for moderate pain. 02/06/21   Long, Wonda Olds, MD  methocarbamol (ROBAXIN) 500  MG tablet Take 1 tablet (500 mg total) by mouth every 6 (six) hours as needed for muscle spasms. 02/06/21   Long, Wonda Olds, MD    Allergies    Patient has no known allergies.  Review of Systems   Review of Systems  Gastrointestinal:  Positive for abdominal pain.  Genitourinary:  Positive for dysuria.  All other systems reviewed and are negative.  Physical Exam Updated Vital Signs BP (!) 129/97 (BP Location: Right Arm)   Pulse 81   Temp 98.6 F (37 C)   Resp 16   Ht 5\' 4"  (1.626 m)   Wt 115 kg   SpO2 100%   BMI 43.52 kg/m   Physical Exam Vitals and nursing note reviewed.  Constitutional:      General: She is not in acute distress.    Appearance: Normal appearance. She is well-developed.  HENT:     Head: Normocephalic and atraumatic.     Right Ear: Hearing normal.     Left Ear: Hearing normal.     Nose: Nose normal.  Eyes:     Conjunctiva/sclera: Conjunctivae normal.      Pupils: Pupils are equal, round, and reactive to light.  Cardiovascular:     Rate and Rhythm: Regular rhythm.     Heart sounds: S1 normal and S2 normal. No murmur heard.   No friction rub. No gallop.  Pulmonary:     Effort: Pulmonary effort is normal. No respiratory distress.     Breath sounds: Normal breath sounds.  Chest:     Chest wall: No tenderness.  Abdominal:     General: Bowel sounds are normal.     Palpations: Abdomen is soft.     Tenderness: There is abdominal tenderness in the epigastric area and periumbilical area. There is no guarding or rebound. Negative signs include Murphy's sign and McBurney's sign.     Hernia: No hernia is present.  Musculoskeletal:        General: Normal range of motion.     Cervical back: Normal range of motion and neck supple.  Skin:    General: Skin is warm and dry.     Findings: No rash.  Neurological:     Mental Status: She is alert and oriented to person, place, and time.     GCS: GCS eye subscore is 4. GCS verbal subscore is 5. GCS motor subscore is 6.     Cranial Nerves: No cranial nerve deficit.     Sensory: No sensory deficit.     Coordination: Coordination normal.  Psychiatric:        Speech: Speech normal.        Behavior: Behavior normal.        Thought Content: Thought content normal.    ED Results / Procedures / Treatments   Labs (all labs ordered are listed, but only abnormal results are displayed) Labs Reviewed  WET PREP, GENITAL - Abnormal; Notable for the following components:      Result Value   WBC, Wet Prep HPF POC MANY (*)    All other components within normal limits  COMPREHENSIVE METABOLIC PANEL - Abnormal; Notable for the following components:   Glucose, Bld 111 (*)    Calcium 8.3 (*)    Total Protein 5.9 (*)    Albumin 3.2 (*)    All other components within normal limits  CBC - Abnormal; Notable for the following components:   RBC 5.47 (*)    MCV 73.3 (*)    [facility]  23.0 (*)    RDW 16.4 (*)    All other  components within normal limits  URINALYSIS, ROUTINE W REFLEX MICROSCOPIC - Abnormal; Notable for the following components:   Leukocytes,Ua SMALL (*)    All other components within normal limits  LIPASE, BLOOD  I-STAT BETA HCG BLOOD, ED ([facility], [facility], [facility] ONLY)  GC/CHLAMYDIA PROBE AMP ([place]) NOT AT [facility]    EKG None  Radiology No results found.  Procedures Procedures   Medications Ordered in ED Medications  hyoscyamine (LEVSIN SL) SL tablet 0.25 mg (0.25 mg Sublingual Given 05/12/21 0117)    ED Course  I have reviewed the triage vital signs and the nursing notes.  Pertinent labs & imaging results that were available during my care of the patient were reviewed by me and considered in my medical decision making (see chart for details).    MDM Rules/Calculators/A&P                          Patient presents to the emergency department with multiple complaints.  Patient with abdominal pain ongoing for a couple of days.  Patient describes it as cramping, waxing and waning.  Abdominal exam reveals some mild mid abdominal tenderness but no guarding or rebound.  No focal tenderness or peritonitis.  No tenderness in the right upper quadrant or at McBurney's point.  Lab work reassuring.  Patient reports improvement after Levsin.    Urinalysis does not suggest infection.  She does report some dysuria as well as vaginal irritation.  No external lesions.  Wet prep with large white blood cells, no other findings.  GC and Chlamydia pending, will initiate empiric coverage as she has had previous STDs.  Does not require further work-up from the abdominal pain at this time as her exam is benign.  Given return precautions.  Final Clinical Impression(s) / ED Diagnoses Final diagnoses:  Generalized abdominal pain  Vaginal irritation    Rx / DC Orders ED Discharge Orders     None        Susan Bleich, Gwenyth Allegra, MD 05/12/21 [phone]

## 2021-05-21 LAB — GC/CHLAMYDIA PROBE AMP (~~LOC~~) NOT AT ARMC

## 2021-06-03 ENCOUNTER — Telehealth (HOSPITAL_COMMUNITY): Payer: Self-pay

## 2021-06-03 ENCOUNTER — Other Ambulatory Visit: Payer: Self-pay

## 2021-06-03 ENCOUNTER — Ambulatory Visit (HOSPITAL_COMMUNITY)
Admission: EM | Admit: 2021-06-03 | Discharge: 2021-06-03 | Disposition: A | Discharge status: Home / Self Care | Payer: Medicaid Other | Attending: Physician Assistant | Admitting: Physician Assistant

## 2021-06-03 ENCOUNTER — Encounter (HOSPITAL_COMMUNITY): Payer: Self-pay | Admitting: *Deleted

## 2021-06-03 DIAGNOSIS — N760 Acute vaginitis: Secondary | ICD-10-CM | POA: Diagnosis not present

## 2021-06-03 MED ORDER — METRONIDAZOLE 500 MG PO TABS: 500.0000 mg | ORAL_TABLET | Freq: Two times a day (BID) | ORAL | 0 refills | Status: DC

## 2021-06-03 MED ORDER — FLUCONAZOLE 150 MG PO TABS: ORAL_TABLET | ORAL | 0 refills | Status: DC

## 2021-06-03 MED ORDER — METRONIDAZOLE 500 MG PO TABS: 500.0000 mg | ORAL_TABLET | Freq: Two times a day (BID) | ORAL | 0 refills | Status: AC

## 2021-06-03 NOTE — ED Triage Notes (Signed)
PT went to PCP Thursday for vaginitis and is waiting for results. Pt here for treatment .

## 2021-06-04 NOTE — ED Provider Notes (Signed)
[facility]    CSN: [id number] Arrival date & time: 06/03/21  1515      History   Chief Complaint Chief Complaint  Patient presents with   Vaginitis    HPI Sara Walsh is a 35 y.o. female.   Pt reports she thinks she has BV.  Pt had wet prep at her MD.  Pt requesting flagyl and diflucan   The history is provided by the patient. No language interpreter was used.  Vaginal Discharge Severity:  Moderate Onset quality:  Gradual Timing:  Constant Progression:  Worsening Chronicity:  New Relieved by:  Nothing Worsened by:  Nothing Ineffective treatments:  None tried Associated symptoms: no abdominal pain    Past Medical History:  Diagnosis Date   Chlamydia    Fibroid    Gestational diabetes    Hypertension    Obesity    Scabies    Thyroid disease    Trichomonas infection     Patient Active Problem List   Diagnosis Date Noted   Surveillance for Depo-Provera contraception 03/09/2020   Dyspareunia in female 09/29/2019   History of uterine fibroid 09/29/2019   History of gestational diabetes 12/30/2018   Constipation 06/08/2018   Essential hypertension 05/18/2018   Uterine fibroid 01/08/2018   BV (bacterial vaginosis) 01/08/2018    Past Surgical History:  Procedure Laterality Date   CESAREAN SECTION     C/S x 5   CESAREAN SECTION WITH BILATERAL TUBAL LIGATION N/A 11/27/2018   Procedure: CESAREAN SECTION;  Surgeon: Donnamae Jude, MD;  Location: [facility];  Service: Obstetrics;  Laterality: N/A;   WISDOM TOOTH EXTRACTION      OB History     Gravida  11   Para  5   Term  4   Preterm  1   AB  6   Living  5      SAB  1   IAB  5   Ectopic  0   Multiple  0   Live Births  5            Home Medications    Prior to Admission medications   Medication Sig Start Date End Date Taking? Authorizing Provider  amLODipine (NORVASC) 2.5 MG tablet Take 2.5 mg by mouth daily. 07/05/20   [provider]  benzonatate  (TESSALON) 100 MG capsule Take 1 capsule (100 mg total) by mouth every 8 (eight) hours. 03/16/21   Hughie Closs, PA-C  diclofenac Sodium (VOLTAREN) 1 % GEL Apply 2 g topically 4 (four) times daily. 02/06/21   Long, Wonda Olds, MD  fluconazole (DIFLUCAN) 150 MG tablet One tablet now then one in 1 week 06/03/21   Fransico Meadow, PA-C  fluticasone [facility]) 50 MCG/ACT nasal spray Place 2 sprays into both nostrils daily. 03/16/21   Hughie Closs, PA-C  hyoscyamine (LEVSIN SL) 0.125 MG SL tablet Place 1-2 tablets (0.125-0.25 mg total) under the tongue every 4 (four) hours as needed for cramping (abdominal pain). 05/12/21   Orpah Greek, MD  ibuprofen (ADVIL) 800 MG tablet Take 1 tablet (800 mg total) by mouth every 6 (six) hours as needed for moderate pain. 02/06/21   Long, Wonda Olds, MD  methocarbamol (ROBAXIN) 500 MG tablet Take 1 tablet (500 mg total) by mouth every 6 (six) hours as needed for muscle spasms. 02/06/21   Long, Wonda Olds, MD  metroNIDAZOLE (FLAGYL) 500 MG tablet Take 1 tablet (500 mg total) by mouth 2 (two) times daily  for 7 days. 06/03/21 06/10/21  Fransico Meadow, PA-C  ondansetron (ZOFRAN) 4 MG tablet Take 1 tablet (4 mg total) by mouth every 6 (six) hours as needed for nausea or vomiting. 05/12/21   Pollina, Gwenyth Allegra, MD    Family History Family History  Problem Relation Age of Onset   Hypertension Mother    Hypertension Father    Hypertension Sister    Anesthesia problems Neg Hx     Social History Social History   Tobacco Use   Smoking status: Former    Types: Cigars, Cigarettes    Quit date: 09/09/2011    Years since quitting: 9.7   Smokeless tobacco: Never  Vaping Use   Vaping Use: Never used  Substance Use Topics   Alcohol use: No   Drug use: Not Currently    Types: Marijuana    Comment: weekends occasionally     Allergies   Patient has no known allergies.   Review of Systems Review of Systems  Gastrointestinal:  Negative for abdominal pain.   Genitourinary:  Positive for vaginal discharge.  All other systems reviewed and are negative.   Physical Exam Triage Vital Signs ED Triage Vitals  Enc Vitals Group     BP 06/03/21 1612 118/86     Pulse Rate 06/03/21 1612 98     Resp 06/03/21 1612 16     Temp 06/03/21 1612 99 F (37.2 C)     Temp src --      SpO2 06/03/21 1612 99 %     Weight --      Height --      Head Circumference --      Peak Flow --      Pain Score 06/03/21 1613 10     Pain Loc --      Pain Edu? --      Excl. in [place]? --    No data found.  Updated Vital Signs BP 118/86   Pulse 98   Temp 99 F (37.2 C)   Resp 16   SpO2 99%   Visual Acuity Right Eye Distance:   Left Eye Distance:   Bilateral Distance:    Right Eye Near:   Left Eye Near:    Bilateral Near:     Physical Exam Vitals and nursing note reviewed.  Constitutional:      Appearance: She is well-developed.  HENT:     Head: Normocephalic.  Cardiovascular:     Rate and Rhythm: Normal rate.  Pulmonary:     Effort: Pulmonary effort is normal.  Abdominal:     General: There is no distension.  Musculoskeletal:        General: Normal range of motion.     Cervical back: Normal range of motion.  Neurological:     Mental Status: She is alert and oriented to person, place, and time.     UC Treatments / Results  Labs (all labs ordered are listed, but only abnormal results are displayed) Labs Reviewed - No data to display  EKG   Radiology No results found.  Procedures Procedures (including critical care time)  Medications Ordered in UC Medications - No data to display  Initial Impression / Assessment and Plan / UC Course  I have reviewed the triage vital signs and the nursing notes.  Pertinent labs & imaging results that were available during my care of the patient were reviewed by me and considered in my medical decision making (see chart for details).  MDM:  Pt given rx for flagyl and diflucan  Final Clinical  Impressions(s) / UC Diagnoses   Final diagnoses:  Acute vaginitis   Discharge Instructions   None    ED Prescriptions     Medication Sig Dispense Auth. Provider   metroNIDAZOLE (FLAGYL) 500 MG tablet Take 1 tablet (500 mg total) by mouth 2 (two) times daily for 7 days. 14 tablet Akil Hoos K, PA-C   fluconazole (DIFLUCAN) 150 MG tablet One tablet now then one in 1 week 2 tablet Fransico Meadow, [place]      PDMP not reviewed this encounter. An After Visit Summary was printed and given to the patient.    Fransico Meadow, [place] 06/04/21 [phone]

## 2021-06-22 ENCOUNTER — Emergency Department (HOSPITAL_COMMUNITY)
Admission: EM | Admit: 2021-06-22 | Discharge: 2021-06-22 | Disposition: A | Discharge status: Home / Self Care | Payer: Medicaid Other | Attending: Emergency Medicine | Admitting: Emergency Medicine

## 2021-06-22 ENCOUNTER — Other Ambulatory Visit: Payer: Self-pay

## 2021-06-22 DIAGNOSIS — L040 Acute lymphadenitis of face, head and neck: Secondary | ICD-10-CM | POA: Diagnosis not present

## 2021-06-22 DIAGNOSIS — I889 Nonspecific lymphadenitis, unspecified: Secondary | ICD-10-CM

## 2021-06-22 DIAGNOSIS — R221 Localized swelling, mass and lump, neck: Secondary | ICD-10-CM | POA: Diagnosis present

## 2021-06-22 DIAGNOSIS — Z87891 Personal history of nicotine dependence: Secondary | ICD-10-CM | POA: Diagnosis not present

## 2021-06-22 DIAGNOSIS — Z79899 Other long term (current) drug therapy: Secondary | ICD-10-CM | POA: Diagnosis not present

## 2021-06-22 DIAGNOSIS — I10 Essential (primary) hypertension: Secondary | ICD-10-CM | POA: Insufficient documentation

## 2021-06-22 MED ORDER — ACETAMINOPHEN 500 MG PO TABS: 1000.0000 mg | ORAL_TABLET | Freq: Once | ORAL | Status: DC

## 2021-06-22 MED ORDER — AMOXICILLIN-POT CLAVULANATE 875-125 MG PO TABS: 1.0000 | ORAL_TABLET | Freq: Two times a day (BID) | ORAL | 0 refills | Status: AC

## 2021-06-22 NOTE — ED Provider Notes (Signed)
[facility] EMERGENCY DEPARTMENT Provider Note   CSN: [id number] Arrival date & time: 06/22/21  0754     History Chief Complaint  Patient presents with   Lymph node swelling    Sara Walsh is a 35 y.o. female.  HPI  35 year old female presenting to the emergency department with 2 days of neck swelling.  The patient notes a lymph node that is swollen and somewhat tender.  She denies any fevers or chills.  She denies any chest pain or shortness of breath.  She denies any sore throat or trouble swallowing.  No recent exposure to cats.  She arrived to the [facility] health emergency department ABC intact.  Past Medical History:  Diagnosis Date   Chlamydia    Fibroid    Gestational diabetes    Hypertension    Obesity    Scabies    Thyroid disease    Trichomonas infection     Patient Active Problem List   Diagnosis Date Noted   Surveillance for Depo-Provera contraception 03/09/2020   Dyspareunia in female 09/29/2019   History of uterine fibroid 09/29/2019   History of gestational diabetes 12/30/2018   Constipation 06/08/2018   Essential hypertension 05/18/2018   Uterine fibroid 01/08/2018   BV (bacterial vaginosis) 01/08/2018    Past Surgical History:  Procedure Laterality Date   CESAREAN SECTION     C/S x 5   CESAREAN SECTION WITH BILATERAL TUBAL LIGATION N/A 11/27/2018   Procedure: CESAREAN SECTION;  Surgeon: Donnamae Jude, MD;  Location: [place];  Service: Obstetrics;  Laterality: N/A;   WISDOM TOOTH EXTRACTION       OB History     Gravida  32   Para  5   Term  4   Preterm  1   AB  6   Living  5      SAB  1   IAB  5   Ectopic  0   Multiple  0   Live Births  5           Family History  Problem Relation Age of Onset   Hypertension Mother    Hypertension Father    Hypertension Sister    Anesthesia problems Neg Hx     Social History   Tobacco Use   Smoking status: Former    Types: Cigars, Cigarettes     Quit date: 09/09/2011    Years since quitting: 9.7   Smokeless tobacco: Never  Vaping Use   Vaping Use: Never used  Substance Use Topics   Alcohol use: No   Drug use: Not Currently    Types: Marijuana    Comment: weekends occasionally    Home Medications Prior to Admission medications   Medication Sig Start Date End Date Taking? Authorizing Provider  amoxicillin-clavulanate (AUGMENTIN) 875-125 MG tablet Take 1 tablet by mouth every 12 (twelve) hours for 10 days. 06/22/21 07/02/21 Yes Regan Lemming, MD  amLODipine (NORVASC) 2.5 MG tablet Take 2.5 mg by mouth daily. 07/05/20   [provider]  benzonatate (TESSALON) 100 MG capsule Take 1 capsule (100 mg total) by mouth every 8 (eight) hours. 03/16/21   Hughie Closs, PA-C  diclofenac Sodium (VOLTAREN) 1 % GEL Apply 2 g topically 4 (four) times daily. 02/06/21   Long, Wonda Olds, MD  fluconazole (DIFLUCAN) 150 MG tablet One tablet now then one in 1 week 06/03/21   Fransico Meadow, PA-C  fluticasone [facility]) 50 MCG/ACT nasal spray Place 2 sprays  into both nostrils daily. 03/16/21   Hughie Closs, PA-C  hyoscyamine (LEVSIN SL) 0.125 MG SL tablet Place 1-2 tablets (0.125-0.25 mg total) under the tongue every 4 (four) hours as needed for cramping (abdominal pain). 05/12/21   Orpah Greek, MD  ibuprofen (ADVIL) 800 MG tablet Take 1 tablet (800 mg total) by mouth every 6 (six) hours as needed for moderate pain. 02/06/21   Long, Wonda Olds, MD  methocarbamol (ROBAXIN) 500 MG tablet Take 1 tablet (500 mg total) by mouth every 6 (six) hours as needed for muscle spasms. 02/06/21   Long, Wonda Olds, MD  ondansetron (ZOFRAN) 4 MG tablet Take 1 tablet (4 mg total) by mouth every 6 (six) hours as needed for nausea or vomiting. 05/12/21   Pollina, Gwenyth Allegra, MD    Allergies    Patient has no known allergies.  Review of Systems   Review of Systems  Constitutional:  Negative for chills and fever.  HENT:  Positive for facial swelling.  Negative for ear pain, sore throat, trouble swallowing and voice change.   Eyes:  Negative for pain and visual disturbance.  Respiratory:  Negative for cough and shortness of breath.   Cardiovascular:  Negative for chest pain and palpitations.  Gastrointestinal:  Negative for abdominal pain and vomiting.  Genitourinary:  Negative for dysuria and hematuria.  Musculoskeletal:  Positive for neck pain. Negative for arthralgias and back pain.  Skin:  Negative for color change and rash.  Neurological:  Negative for seizures and syncope.  All other systems reviewed and are negative.  Physical Exam Updated Vital Signs BP (!) 140/92 (BP Location: Right Arm)   Pulse (!) 116   Temp 99.1 F (37.3 C) (Oral)   Resp 14   Ht '5\' 4"'$  (1.626 m)   Wt 117.9 kg   SpO2 95%   BMI 44.63 kg/m   Physical Exam Vitals and nursing note reviewed.  Constitutional:      General: She is not in acute distress.    Appearance: She is well-developed.  HENT:     Head: Normocephalic and atraumatic.     Jaw: No trismus or swelling.     Salivary Glands: Right salivary gland is not diffusely enlarged or tender. Left salivary gland is not diffusely enlarged or tender.     Comments: Single tender firm lymph node about the anterior neck in the left submandibular region with some surrounding swelling.    Mouth/Throat:     Mouth: Mucous membranes are moist. No oral lesions.     Dentition: Normal dentition.     Tongue: No lesions.     Pharynx: Oropharynx is clear. Uvula midline. No pharyngeal swelling or oropharyngeal exudate.     Tonsils: No tonsillar exudate or tonsillar abscesses.  Eyes:     Conjunctiva/sclera: Conjunctivae normal.  Cardiovascular:     Rate and Rhythm: Normal rate and regular rhythm.     Heart sounds: No murmur heard. Pulmonary:     Effort: Pulmonary effort is normal. No respiratory distress.     Breath sounds: Normal breath sounds.  Abdominal:     Palpations: Abdomen is soft.     Tenderness:  There is no abdominal tenderness.  Musculoskeletal:     Cervical back: Neck supple.  Skin:    General: Skin is warm and dry.  Neurological:     Mental Status: She is alert.    ED Results / Procedures / Treatments   Labs (all labs ordered are listed, but only abnormal  results are displayed) Labs Reviewed - No data to display  EKG None  Radiology No results found.  Procedures Procedures   Medications Ordered in ED Medications  acetaminophen (TYLENOL) tablet 1,000 mg (has no administration in time range)    ED Course  I have reviewed the triage vital signs and the nursing notes.  Pertinent labs & imaging results that were available during my care of the patient were reviewed by me and considered in my medical decision making (see chart for details).    MDM Rules/Calculators/A&P                           35 year old female presenting with 2 days of findings concerning for lymphadenitis.  No associated exudative pharyngitis.  No evidence of Ludwick's angina.  The patient is overall protecting her airway, ABC intact.  No signs or symptoms of severe infection.  The patient is overall well-appearing and tolerating oral intake.  Afebrile.  Will prescribe a course of Augmentin for treatment of lymphadenitis and have the patient follow-up with her PCP in a few days for a recheck.  Return precautions for significant worsening of symptoms provided.  Final Clinical Impression(s) / ED Diagnoses Final diagnoses:  Lymphadenitis    Rx / DC Orders ED Discharge Orders          Ordered    amoxicillin-clavulanate (AUGMENTIN) 875-125 MG tablet  Every 12 hours        06/22/21 0821             Regan Lemming, MD 06/22/21 [phone]

## 2021-06-22 NOTE — Discharge Instructions (Addendum)
You have been diagnosed with lymphadenitis which is swelling and pain of the lymph node commonly due to a viral or bacterial infection.  In the event that this is a bacterial infection, will prescribe a course of Augmentin for 10 days.  Recommend Tylenol and ibuprofen for pain control.  Follow-up with your PCP for recheck if your symptoms remain fairly mild but are not improving or getting worse.  In the event of significant worsening of your symptoms, difficulty maintaining your airway, worsening fever, chills, concern for severe infection despite antibiotic use, please return to the emergency department.

## 2021-06-22 NOTE — ED Triage Notes (Signed)
Pt c/o swelling on her neck that started a few days ago. Pt denies SOB, trouble swallowing.

## 2021-06-22 NOTE — ED Notes (Signed)
Pt ambulatory to waiting room. Pt verbalized understanding of discharge instructions.   

## 2021-06-25 ENCOUNTER — Ambulatory Visit: Payer: Medicaid Other

## 2021-09-17 ENCOUNTER — Encounter: Payer: Self-pay | Admitting: Obstetrics and Gynecology

## 2021-09-17 ENCOUNTER — Ambulatory Visit (INDEPENDENT_AMBULATORY_CARE_PROVIDER_SITE_OTHER): Payer: Medicaid Other | Admitting: Obstetrics and Gynecology

## 2021-09-17 ENCOUNTER — Other Ambulatory Visit (HOSPITAL_COMMUNITY)
Admission: RE | Admit: 2021-09-17 | Discharge: 2021-09-17 | Disposition: A | Discharge status: Home / Self Care | Payer: Medicaid Other | Source: Ambulatory Visit | Attending: Family Medicine | Admitting: Family Medicine

## 2021-09-17 ENCOUNTER — Other Ambulatory Visit: Payer: Self-pay

## 2021-09-17 VITALS — BP 135/67 | HR 99 | Wt 276.0 lb

## 2021-09-17 DIAGNOSIS — Z8619 Personal history of other infectious and parasitic diseases: Secondary | ICD-10-CM | POA: Diagnosis not present

## 2021-09-17 DIAGNOSIS — Z01419 Encounter for gynecological examination (general) (routine) without abnormal findings: Secondary | ICD-10-CM

## 2021-09-17 DIAGNOSIS — Z202 Contact with and (suspected) exposure to infections with a predominantly sexual mode of transmission: Secondary | ICD-10-CM

## 2021-09-17 DIAGNOSIS — N760 Acute vaginitis: Secondary | ICD-10-CM | POA: Insufficient documentation

## 2021-09-17 DIAGNOSIS — D259 Leiomyoma of uterus, unspecified: Secondary | ICD-10-CM

## 2021-09-17 DIAGNOSIS — Z3042 Encounter for surveillance of injectable contraceptive: Secondary | ICD-10-CM

## 2021-09-17 MED ORDER — MEDROXYPROGESTERONE ACETATE 150 MG/ML IM SUSP
150.0000 mg | Freq: Once | INTRAMUSCULAR | Status: AC
  Administered 2021-09-17: 150 mg via INTRAMUSCULAR

## 2021-09-17 NOTE — Progress Notes (Signed)
Obstetrics and Gynecology Annual Patient Evaluation  Appointment Date: 09/17/2021  OBGYN Clinic: Center for [facility]  Primary Care Provider: Medicine, [facility]  Chief Complaint:  Chief Complaint  Patient presents with   Gynecologic Exam    History of Present Illness: Sara Walsh is a 35 y.o. African-American [id number] (Patient's last menstrual period was 08/28/2021.), seen for the above chief complaint. Her past medical history is significant for h/o c/s x 5, HTN, BMI 40s, h/o pelvic pain and aub, fibroids  Patient had been on chronic depo provera with last show march of this year but missed them since due to not being notified on the app about needing to come in. Last period was earlier this month and it wasn't heavy or painful; last sex was prior to period   Pt with discharge and irritation.   Review of Systems: Pertinent items noted in HPI and remainder of comprehensive ROS otherwise negative.    Patient Active Problem List   Diagnosis Date Noted   Surveillance for Depo-Provera contraception 03/09/2020   Dyspareunia in female 09/29/2019   History of uterine fibroid 09/29/2019   History of gestational diabetes 12/30/2018   Constipation 06/08/2018   Essential hypertension 05/18/2018   Uterine fibroid 01/08/2018   BV (bacterial vaginosis) 01/08/2018    Past Medical History:  Past Medical History:  Diagnosis Date   Chlamydia    Fibroid    Gestational diabetes    Hypertension    Obesity    Scabies    Thyroid disease    Trichomonas infection     Past Surgical History:  Past Surgical History:  Procedure Laterality Date   CESAREAN SECTION     C/S x 5   CESAREAN SECTION WITH BILATERAL TUBAL LIGATION N/A 11/27/2018   Procedure: CESAREAN SECTION;  Surgeon: Donnamae Jude, MD;  Location: [place];  Service: Obstetrics;  Laterality: N/A;   WISDOM TOOTH EXTRACTION      Past Obstetrical History:  OB  History  Gravida Para Term Preterm AB Living  11 5 4 1 6 5   SAB IAB Ectopic Multiple Live Births  1 5 0 0 5    # Outcome Date GA Lbr Len/2nd Weight Sex Delivery Anes PTL Lv  11 Preterm 11/27/18 [redacted]w[redacted]d  4 lb 9 oz (2.07 kg) F CS-LTranv Spinal  LIV  10 Term 05/11/12 [redacted]w[redacted]d   M CS-Unspec   LIV  9 IAB 03/23/09          8 Term 01/07/07 [redacted]w[redacted]d   F CS-Unspec   LIV  7 Term 07/24/05 [redacted]w[redacted]d   F CS-Unspec   LIV  6 Term 05/05/02 [redacted]w[redacted]d   F CS-Unspec   LIV  5 SAB           4 IAB           3 IAB           2 IAB              Birth Comments: System Generated. Please review and update pregnancy details.  1 IAB             Past Gynecological History: As per HPI. History of Pap Smear(s): Yes.   Last pap 2021, which was neg and hpv neg She is currently using  nothing  for contraception.   Social History:  Social History   Socioeconomic History   Marital status: Single    Spouse name: Not on file   Number of  children: Not on file   Years of education: Not on file   Highest education level: Not on file  Occupational History   Not on file  Tobacco Use   Smoking status: Former    Types: Cigars, Cigarettes    Quit date: 09/09/2011    Years since quitting: 10.0   Smokeless tobacco: Never  Vaping Use   Vaping Use: Never used  Substance and Sexual Activity   Alcohol use: No   Drug use: Not Currently    Types: Marijuana    Comment: weekends occasionally   Sexual activity: Yes    Birth control/protection: Injection  Other Topics Concern   Not on file  Social History Narrative   Not on file   Social Determinants of Health   Financial Resource Strain: Not on file  Food Insecurity: No Food Insecurity   Worried About Running Out of Food in the Last Year: Never true   Ran Out of Food in the Last Year: Never true  Transportation Needs: No Transportation Needs   Lack of Transportation (Medical): No   Lack of Transportation (Non-Medical): No  Physical Activity: Not on file  Stress: Not on file   Social Connections: Not on file  Intimate Partner Violence: Not on file    Family History:  Family History  Problem Relation Age of Onset   Hypertension Mother    Hypertension Father    Hypertension Sister    Anesthesia problems Neg Hx     Medications We administered medroxyPROGESTERone. Current Outpatient Medications  Medication Sig Dispense Refill   amLODipine (NORVASC) 2.5 MG tablet Take 2.5 mg by mouth daily.     benzonatate (TESSALON) 100 MG capsule Take 1 capsule (100 mg total) by mouth every 8 (eight) hours. (Patient not taking: Reported on 09/17/2021) 21 capsule 0   diclofenac Sodium (VOLTAREN) 1 % GEL Apply 2 g topically 4 (four) times daily. (Patient not taking: Reported on 09/17/2021) 100 g 0   fluconazole (DIFLUCAN) 150 MG tablet One tablet now then one in 1 week (Patient not taking: Reported on 09/17/2021) 2 tablet 0   fluticasone (FLONASE) 50 MCG/ACT nasal spray Place 2 sprays into both nostrils daily. (Patient not taking: Reported on 09/17/2021) 16 mL 0   hyoscyamine (LEVSIN SL) 0.125 MG SL tablet Place 1-2 tablets (0.125-0.25 mg total) under the tongue every 4 (four) hours as needed for cramping (abdominal pain). (Patient not taking: Reported on 09/17/2021) 30 tablet 0   ibuprofen (ADVIL) 800 MG tablet Take 1 tablet (800 mg total) by mouth every 6 (six) hours as needed for moderate pain. (Patient not taking: Reported on 09/17/2021) 21 tablet 0   methocarbamol (ROBAXIN) 500 MG tablet Take 1 tablet (500 mg total) by mouth every 6 (six) hours as needed for muscle spasms. (Patient not taking: Reported on 09/17/2021) 20 tablet 0   ondansetron (ZOFRAN) 4 MG tablet Take 1 tablet (4 mg total) by mouth every 6 (six) hours as needed for nausea or vomiting. (Patient not taking: Reported on 09/17/2021) 20 tablet 0   No current facility-administered medications for this visit.    Allergies Patient has no known allergies.   Physical Exam:  BP 135/67   Pulse 99   Wt 276 lb  (125.2 kg)   LMP 08/28/2021   BMI 47.38 kg/m  Body mass index is 47.38 kg/m. General appearance: Well nourished, well developed female in no acute distress.  Neck:  Supple, normal appearance, and no thyromegaly  Cardiovascular: normal s1 and s2.  No murmurs, rubs or gallops. Respiratory:  Clear to auscultation bilateral. Normal respiratory effort Abdomen: positive bowel sounds and no masses, hernias; diffusely non tender to palpation, non distended Breasts: patient denies any breast s/s. Neuro/Psych:  Normal mood and affect.  Skin:  Warm and dry.  Lymphatic:  No inguinal lymphadenopathy.   Pelvic exam: declined  Laboratory: none  Radiology: none  Assessment: pt stable  Plan:  1. History of trichomoniasis Self swab done - Cervicovaginal ancillary only( [place])  2. Possible exposure to STD - Cervicovaginal ancillary only( [place]) - HIV Antibody (routine testing w rflx) - RPR - Hepatitis B Surface AntiGEN - Hepatitis C Antibody  3. Well woman exam with routine gynecological exam - Cervicovaginal ancillary only( [place]) - HIV Antibody (routine testing w rflx) - RPR - Hepatitis B Surface AntiGEN - Hepatitis C Antibody  4. Surveillance for Depo-Provera contraception I recommend she restart which she is amenable to - medroxyPROGESTERone (DEPO-PROVERA) injection 150 mg  5. Uterine leiomyoma, unspecified location  6. Acute vaginitis - Cervicovaginal ancillary only( [place])  Orders Placed This Encounter  Procedures   HIV Antibody (routine testing w rflx)   RPR   Hepatitis B Surface AntiGEN   Hepatitis C Antibody    RTC 42m for RN depo shot  Aletha Halim, Brooke Bonito MD Attending Center for [employer])

## 2021-09-18 LAB — CERVICOVAGINAL ANCILLARY ONLY
Bacterial Vaginitis (gardnerella): NEGATIVE
Candida Glabrata: NEGATIVE
Candida Vaginitis: POSITIVE — AB
Chlamydia: NEGATIVE
Comment: NEGATIVE
Comment: NEGATIVE
Comment: NEGATIVE
Comment: NEGATIVE
Comment: NEGATIVE
Comment: NORMAL
Neisseria Gonorrhea: NEGATIVE
Trichomonas: NEGATIVE

## 2021-09-18 LAB — HEPATITIS C ANTIBODY: Hep C Virus Ab: <0.1 s/co ratio (ref 0.0–0.9)

## 2021-09-18 LAB — HIV ANTIBODY (ROUTINE TESTING W REFLEX): HIV Screen 4th Generation wRfx: NONREACTIVE

## 2021-09-18 LAB — RPR: RPR Ser Ql: NONREACTIVE

## 2021-09-18 LAB — HEPATITIS B SURFACE ANTIGEN: Hepatitis B Surface Ag: NEGATIVE

## 2021-09-19 ENCOUNTER — Encounter: Payer: Self-pay | Admitting: Obstetrics and Gynecology

## 2021-09-19 MED ORDER — FLUCONAZOLE 150 MG PO TABS: 150.0000 mg | ORAL_TABLET | Freq: Once | ORAL | 1 refills | Status: AC

## 2021-09-19 NOTE — Addendum Note (Signed)
Addended by: Aletha Halim on: 09/19/2021 04:49 PM   Modules accepted: Orders

## 2021-10-04 ENCOUNTER — Other Ambulatory Visit (HOSPITAL_BASED_OUTPATIENT_CLINIC_OR_DEPARTMENT_OTHER): Payer: Self-pay

## 2021-10-04 ENCOUNTER — Ambulatory Visit: Payer: Medicaid Other | Attending: Internal Medicine

## 2021-10-04 DIAGNOSIS — Z23 Encounter for immunization: Secondary | ICD-10-CM

## 2021-10-04 MED ORDER — PFIZER-BIONT COVID-19 VAC-TRIS 30 MCG/0.3ML IM SUSP
INTRAMUSCULAR | 0 refills | Status: DC
  Filled 2021-10-04: qty 0.3, 1d supply, fill #0

## 2021-10-04 NOTE — Progress Notes (Signed)
   Covid-19 Vaccination Clinic  Name:  Sara Walsh    MRN: [id number] DOB: 25-May-1986  10/04/2021  Sara Walsh was observed post Covid-19 immunization for 15 minutes without incident. She was provided with Vaccine Information Sheet and instruction to access the V-Safe system.   Sara Walsh was instructed to call 911 with any severe reactions post vaccine: Difficulty breathing  Swelling of face and throat  A fast heartbeat  A bad rash all over body  Dizziness and weakness   Immunizations Administered     Name Date Dose VIS Date Route   PFIZER Comrnaty(Gray TOP) Covid-19 Vaccine 10/04/2021  3:48 PM 0.3 mL 06/27/2021 Intramuscular   Manufacturer: [employer]   Lot: [id number]   [place]: [phone]

## 2021-12-18 ENCOUNTER — Ambulatory Visit (INDEPENDENT_AMBULATORY_CARE_PROVIDER_SITE_OTHER): Payer: Medicaid Other

## 2021-12-18 ENCOUNTER — Other Ambulatory Visit: Payer: Self-pay

## 2021-12-18 VITALS — BP 122/72 | HR 92 | Wt 280.0 lb

## 2021-12-18 DIAGNOSIS — Z3042 Encounter for surveillance of injectable contraceptive: Secondary | ICD-10-CM

## 2021-12-18 LAB — POCT PREGNANCY, URINE: Preg Test, Ur: NEGATIVE

## 2021-12-18 MED ORDER — MEDROXYPROGESTERONE ACETATE 150 MG/ML IM SUSP
150.0000 mg | Freq: Once | INTRAMUSCULAR | Status: AC
  Administered 2021-12-18: 150 mg via INTRAMUSCULAR

## 2021-12-18 NOTE — Progress Notes (Signed)
Loma Sousa here for Depo-Provera Injection. Last depo injection given 09/17/21. Urine pregnancy test negative. Injection administered without complication. Patient will return in 3 months for next injection between May 9 and May 23. Next annual visit due November 2023.   Seth Bake, RN 12/18/2021

## 2022-03-05 ENCOUNTER — Ambulatory Visit (INDEPENDENT_AMBULATORY_CARE_PROVIDER_SITE_OTHER): Payer: Medicaid Other

## 2022-03-05 VITALS — BP 126/86 | HR 93 | Wt 286.8 lb

## 2022-03-05 DIAGNOSIS — Z3042 Encounter for surveillance of injectable contraceptive: Secondary | ICD-10-CM

## 2022-03-05 MED ORDER — MEDROXYPROGESTERONE ACETATE 150 MG/ML IM SUSP
150.0000 mg | Freq: Once | INTRAMUSCULAR | Status: AC
  Administered 2022-03-05: 150 mg via INTRAMUSCULAR

## 2022-03-05 NOTE — Progress Notes (Signed)
Sara Walsh here for Depo-Provera Injection. Injection administered without complication. Patient will return in 3 months for next injection between 05/21/22 and 06/04/22. Next annual visit due November 2023. Patient has some questions regarding fibroids. Denies any pain or bleeding. Offered next available provider appt or to discuss at annual. Pt would like to wait for annual visit. ? ?Annabell Howells, RN ?03/05/2022 ?

## 2022-05-21 ENCOUNTER — Ambulatory Visit (INDEPENDENT_AMBULATORY_CARE_PROVIDER_SITE_OTHER): Payer: Medicaid Other

## 2022-05-21 ENCOUNTER — Other Ambulatory Visit: Payer: Self-pay

## 2022-05-21 VITALS — BP 123/88 | HR 95 | Wt 280.8 lb

## 2022-05-21 DIAGNOSIS — Z3042 Encounter for surveillance of injectable contraceptive: Secondary | ICD-10-CM

## 2022-05-21 MED ORDER — MEDROXYPROGESTERONE ACETATE 150 MG/ML IM SUSP
150.0000 mg | Freq: Once | INTRAMUSCULAR | Status: AC
  Administered 2022-05-21: 150 mg via INTRAMUSCULAR

## 2022-05-21 NOTE — Progress Notes (Signed)
Sara Walsh here for Depo-Provera Injection. Injection administered without complication. Patient will return in 3 months for next injection between 08/06/22 and 08/20/22. Next annual visit due November 2023.   Annabell Howells, RN 05/21/2022  10:41 AM

## 2022-08-27 ENCOUNTER — Other Ambulatory Visit (HOSPITAL_BASED_OUTPATIENT_CLINIC_OR_DEPARTMENT_OTHER): Payer: Self-pay

## 2022-08-27 MED ORDER — INFLUENZA VAC SPLIT QUAD 0.5 ML IM SUSY
PREFILLED_SYRINGE | INTRAMUSCULAR | 0 refills | Status: DC
  Filled 2022-08-27: qty 0.5, 1d supply, fill #0

## 2022-09-06 ENCOUNTER — Ambulatory Visit (INDEPENDENT_AMBULATORY_CARE_PROVIDER_SITE_OTHER): Payer: Medicaid Other

## 2022-09-06 ENCOUNTER — Other Ambulatory Visit: Payer: Self-pay

## 2022-09-06 VITALS — BP 125/84 | HR 97

## 2022-09-06 DIAGNOSIS — Z3042 Encounter for surveillance of injectable contraceptive: Secondary | ICD-10-CM

## 2022-09-06 LAB — POCT PREGNANCY, URINE: Preg Test, Ur: NEGATIVE

## 2022-09-06 MED ORDER — MEDROXYPROGESTERONE ACETATE 150 MG/ML IM SUSP
150.0000 mg | Freq: Once | INTRAMUSCULAR | Status: AC
  Administered 2022-09-06: 150 mg via INTRAMUSCULAR

## 2022-09-06 NOTE — Progress Notes (Signed)
Loma Sousa here for Depo-Provera Injection. Pt outside of anticipated next dose range. UPT performed per protocol. UPT negative. Injection administered without complication. Patient will return in 3 months for next injection between 01/26 and 02/09. Next annual visit due with next Depo appointment or earliest provider availability.   Darlyne Russian, RN 09/06/2022  10:53 AM

## 2022-10-29 DIAGNOSIS — N898 Other specified noninflammatory disorders of vagina: Secondary | ICD-10-CM | POA: Diagnosis not present

## 2022-11-22 ENCOUNTER — Ambulatory Visit: Payer: Medicaid Other | Admitting: Certified Nurse Midwife

## 2022-11-22 DIAGNOSIS — I10 Essential (primary) hypertension: Secondary | ICD-10-CM

## 2022-11-22 DIAGNOSIS — Z01419 Encounter for gynecological examination (general) (routine) without abnormal findings: Secondary | ICD-10-CM

## 2022-11-22 DIAGNOSIS — Z3042 Encounter for surveillance of injectable contraceptive: Secondary | ICD-10-CM

## 2022-11-22 NOTE — Progress Notes (Signed)
Pt did not come to appt

## 2023-01-07 ENCOUNTER — Ambulatory Visit: Payer: 59 | Admitting: Family Medicine

## 2023-03-13 ENCOUNTER — Other Ambulatory Visit (HOSPITAL_BASED_OUTPATIENT_CLINIC_OR_DEPARTMENT_OTHER): Payer: Self-pay

## 2023-03-13 MED ORDER — AMLODIPINE BESYLATE 2.5 MG PO TABS
2.5000 mg | ORAL_TABLET | Freq: Every day | ORAL | 0 refills | Status: DC
  Filled 2023-03-13: qty 90, 90d supply, fill #0

## 2023-03-14 ENCOUNTER — Other Ambulatory Visit (HOSPITAL_BASED_OUTPATIENT_CLINIC_OR_DEPARTMENT_OTHER): Payer: Self-pay

## 2023-04-15 ENCOUNTER — Encounter: Payer: Self-pay | Admitting: Obstetrics and Gynecology

## 2023-04-15 ENCOUNTER — Ambulatory Visit (INDEPENDENT_AMBULATORY_CARE_PROVIDER_SITE_OTHER): Payer: 59 | Admitting: Obstetrics and Gynecology

## 2023-04-15 ENCOUNTER — Other Ambulatory Visit: Payer: Self-pay

## 2023-04-15 ENCOUNTER — Other Ambulatory Visit (HOSPITAL_COMMUNITY)
Admission: RE | Admit: 2023-04-15 | Discharge: 2023-04-15 | Disposition: A | Discharge status: Home / Self Care | Payer: 59 | Source: Ambulatory Visit | Attending: Obstetrics and Gynecology | Admitting: Obstetrics and Gynecology

## 2023-04-15 VITALS — BP 135/92 | HR 125 | Ht 64.0 in | Wt 285.2 lb

## 2023-04-15 DIAGNOSIS — Z3042 Encounter for surveillance of injectable contraceptive: Secondary | ICD-10-CM

## 2023-04-15 DIAGNOSIS — Z01419 Encounter for gynecological examination (general) (routine) without abnormal findings: Secondary | ICD-10-CM | POA: Diagnosis not present

## 2023-04-15 DIAGNOSIS — Z113 Encounter for screening for infections with a predominantly sexual mode of transmission: Secondary | ICD-10-CM | POA: Diagnosis not present

## 2023-04-15 DIAGNOSIS — D259 Leiomyoma of uterus, unspecified: Secondary | ICD-10-CM

## 2023-04-15 DIAGNOSIS — Z124 Encounter for screening for malignant neoplasm of cervix: Secondary | ICD-10-CM | POA: Insufficient documentation

## 2023-04-15 DIAGNOSIS — R1013 Epigastric pain: Secondary | ICD-10-CM | POA: Diagnosis not present

## 2023-04-15 LAB — POCT PREGNANCY, URINE: Preg Test, Ur: NEGATIVE

## 2023-04-15 MED ORDER — PANTOPRAZOLE SODIUM 40 MG PO TBEC: 40.0000 mg | DELAYED_RELEASE_TABLET | Freq: Every day | ORAL | 3 refills | Status: DC

## 2023-04-15 MED ORDER — MEDROXYPROGESTERONE ACETATE 150 MG/ML IM SUSY
150.0000 mg | PREFILLED_SYRINGE | Freq: Once | INTRAMUSCULAR | Status: AC
  Administered 2023-04-15: 150 mg via INTRAMUSCULAR

## 2023-04-15 NOTE — Progress Notes (Signed)
[place] scheduled June 25th at 1345.  Pt notified.   Leonette Nutting  04/15/23

## 2023-04-15 NOTE — Progress Notes (Signed)
ANNUAL EXAM Patient name: Sara Walsh MRN [id number]  Date of birth: Dec 12, 1985 Chief Complaint:   Annual Exam  History of Present Illness:   Sara Walsh is a 37 y.o. [id number] being seen today for a routine annual exam.  Current complaints: upper abdominal discomfort, swelling  Menstrual concerns? Yes  having amenorreha with depo provera; last injection in January and menses had not yet resumed Breast or nipple changes? No  Contraception use? No  Sexually active? Yes   Feels the top/upper abodmen is bigger and more swollen and has had difficulty with lying on her stomach. Previously diagnosed with fibroids. Has intermittent nausea without vomiting. \   No LMP recorded. Patient has had an injection.   Last pap     Component Value Date/Time   DIAGPAP  09/25/2020 1238    - Negative for intraepithelial lesion or malignancy (NILM)   DIAGPAP  05/28/2018 0000    NEGATIVE FOR INTRAEPITHELIAL LESIONS OR MALIGNANCY.   HPVHIGH Negative 09/25/2020 1238   ADEQPAP  09/25/2020 1238    Satisfactory for evaluation; transformation zone component PRESENT.   ADEQPAP  05/28/2018 0000    Satisfactory for evaluation  endocervical/transformation zone component PRESENT.   Last mammogram: n/a  Last colonoscopy: n/a.      03/05/2022   10:34 AM 01/19/2021   12:32 PM 10/23/2020   10:27 AM 09/25/2020   12:12 PM 08/07/2020   10:39 AM  Depression screen PHQ 2/9  Decreased Interest 0 0 0 0 0  Down, Depressed, Hopeless 0 0 0 0 0  PHQ - 2 Score 0 0 0 0 0  Altered sleeping 0 0 0 0 0  Tired, decreased energy 0 0 0 0 0  Change in appetite 3 0 0 0 0  Feeling bad or failure about yourself  0 0 0 0 0  Trouble concentrating 0 0 0 0 0  Moving slowly or fidgety/restless 0 0 0 0 0  Suicidal thoughts 0 0 0 0 0  PHQ-9 Score 3 0 0 0 0        03/05/2022   10:36 AM 01/19/2021   12:32 PM 10/23/2020   10:27 AM 09/25/2020   12:12 PM  GAD 7 : Generalized Anxiety Score  Nervous, Anxious, on Edge 0  0 0 0  Control/stop worrying 0 0 0 0  Worry too much - different things 0 0 0 0  Trouble relaxing 0 0 0 0  Restless 0 0 0 0  Easily annoyed or irritable 0 0 0 0  Afraid - awful might happen 0 0 0 0  Total GAD 7 Score 0 0 0 0     Review of Systems:   Pertinent items are noted in HPI Denies any headaches, blurred vision, fatigue, shortness of breath, chest pain, abdominal pain, abnormal vaginal discharge/itching/odor/irritation, problems with periods, bowel movements, urination, or intercourse unless otherwise stated above. Pertinent History Reviewed:  Reviewed past medical,surgical, social and family history.  Reviewed problem list, medications and allergies. Physical Assessment:   Vitals:   04/15/23 1121 04/15/23 1135  BP: (!) 137/106 (!) 135/92  Pulse: (!) 130 (!) 125  Weight: 285 lb 3.2 oz (129.4 kg)   Height: 5\' 4"  (1.626 m)   Body mass index is 48.95 kg/m.        Physical Examination:   General appearance - well appearing, and in no distress  Mental status - alert, oriented to person, place, and time  Psych:  She has a normal mood and  affect  Skin - warm and dry, normal color, no suspicious lesions noted  Chest - effort normal, all lung fields clear to auscultation bilaterally  Heart - normal rate and regular rhythm  Breasts - breasts appear normal, no suspicious masses, no skin or nipple changes or  axillary nodes  Abdomen - soft, epigastric tenderness, no rebound or guarding  Pelvic -  VULVA: normal appearing vulva with no masses, tenderness or lesions   VAGINA: normal appearing vagina with normal color and discharge, no lesions   CERVIX: normal appearing cervix without discharge or lesions, no CMT  Thin prep pap is done with HR HPV cotesting  UTERUS: uterus appears to be normal, size not able to be determined  ADNEXA: No adnexal masses, mild left adnexal tenderness noted.  Extremities:  No swelling or varicosities noted  Chaperone present for exam  No results  found for this or any previous visit (from the past 24 hour(s)).    Assessment & Plan:   1. Screening for cervical cancer Pap collected - Cytology - PAP( [place])  2. Well woman exam with routine gynecological exam - Cervical cancer screening: Discussed guidelines. Pap with HPV collected - GC/CT: accepts - Birth Control: Depo - Breast Health: Encouraged self breast awareness/SBE. Teaching provided.  - F/U 12 months and prn  - Cytology - PAP( [place])  3. Uterine leiomyoma, unspecified location [place] ordered to check on fibroids - US PELVIC COMPLETE WITH TRANSVAGINAL; Future  4. Epigastric pain Suspect possible gastritis vs gallbladder. Trial of PPI and follow up with PCP - pantoprazole (PROTONIX) 40 MG tablet; Take 1 tablet (40 mg total) by mouth daily.  Dispense: 30 tablet; Refill: 3  5. Routine screening for STI (sexually transmitted infection) STI screeniing - RPR+HBsAg+HCVAb+...  6. Surveillance for Depo-Provera contraception Depo given today. Discussed menses can take some time prior to returning, pregnancy test negative.    Orders Placed This Encounter  Procedures   US PELVIC COMPLETE WITH TRANSVAGINAL   RPR+HBsAg+HCVAb+...   Pregnancy, urine POC    Meds:  Meds ordered this encounter  Medications   pantoprazole (PROTONIX) 40 MG tablet    Sig: Take 1 tablet (40 mg total) by mouth daily.    Dispense:  30 tablet    Refill:  3   medroxyPROGESTERone Acetate SUSY 150 mg    Follow-up: Return for Annual GYN.  Lorriane Shire, MD 04/15/2023 11:45 AM

## 2023-04-16 LAB — RPR+HBSAG+HCVAB+...
HIV Screen 4th Generation wRfx: NONREACTIVE
Hep C Virus Ab: NONREACTIVE
Hepatitis B Surface Ag: NEGATIVE
RPR Ser Ql: NONREACTIVE

## 2023-04-17 LAB — CYTOLOGY - PAP
Chlamydia: NEGATIVE
Comment: NEGATIVE
Comment: NEGATIVE
Comment: NEGATIVE
Comment: NORMAL
Diagnosis: UNDETERMINED — AB
High risk HPV: NEGATIVE
Neisseria Gonorrhea: NEGATIVE
Trichomonas: NEGATIVE

## 2023-04-22 ENCOUNTER — Ambulatory Visit (HOSPITAL_COMMUNITY): Admission: RE | Admit: 2023-04-22 | Payer: 59 | Source: Ambulatory Visit

## 2023-04-29 ENCOUNTER — Ambulatory Visit (HOSPITAL_COMMUNITY): Admission: RE | Admit: 2023-04-29 | Payer: 59 | Source: Ambulatory Visit

## 2023-05-16 ENCOUNTER — Other Ambulatory Visit (HOSPITAL_BASED_OUTPATIENT_CLINIC_OR_DEPARTMENT_OTHER): Payer: Self-pay

## 2023-05-16 DIAGNOSIS — E559 Vitamin D deficiency, unspecified: Secondary | ICD-10-CM | POA: Diagnosis not present

## 2023-05-16 DIAGNOSIS — Z131 Encounter for screening for diabetes mellitus: Secondary | ICD-10-CM | POA: Diagnosis not present

## 2023-05-16 DIAGNOSIS — I10 Essential (primary) hypertension: Secondary | ICD-10-CM | POA: Diagnosis not present

## 2023-05-16 DIAGNOSIS — D259 Leiomyoma of uterus, unspecified: Secondary | ICD-10-CM | POA: Diagnosis not present

## 2023-05-16 DIAGNOSIS — Z1159 Encounter for screening for other viral diseases: Secondary | ICD-10-CM | POA: Diagnosis not present

## 2023-05-16 DIAGNOSIS — Z13220 Encounter for screening for lipoid disorders: Secondary | ICD-10-CM | POA: Diagnosis not present

## 2023-05-16 DIAGNOSIS — R5383 Other fatigue: Secondary | ICD-10-CM | POA: Diagnosis not present

## 2023-05-16 DIAGNOSIS — Z8349 Family history of other endocrine, nutritional and metabolic diseases: Secondary | ICD-10-CM | POA: Diagnosis not present

## 2023-05-16 MED ORDER — AMLODIPINE BESYLATE 2.5 MG PO TABS
2.5000 mg | ORAL_TABLET | Freq: Every day | ORAL | 1 refills | Status: DC
  Filled 2023-05-16 – 2023-07-01 (×2): qty 90, 90d supply, fill #0
  Filled 2023-10-24: qty 90, 90d supply, fill #1

## 2023-07-01 ENCOUNTER — Ambulatory Visit (INDEPENDENT_AMBULATORY_CARE_PROVIDER_SITE_OTHER): Payer: 59 | Admitting: General Practice

## 2023-07-01 ENCOUNTER — Other Ambulatory Visit: Payer: Self-pay

## 2023-07-01 VITALS — BP 145/94 | HR 86 | Ht 64.0 in | Wt 295.0 lb

## 2023-07-01 DIAGNOSIS — Z3042 Encounter for surveillance of injectable contraceptive: Secondary | ICD-10-CM

## 2023-07-01 MED ORDER — MEDROXYPROGESTERONE ACETATE 150 MG/ML IM SUSY
150.0000 mg | PREFILLED_SYRINGE | Freq: Once | INTRAMUSCULAR | Status: AC
  Administered 2023-07-01: 150 mg via INTRAMUSCULAR

## 2023-07-01 NOTE — Progress Notes (Addendum)
Sara Walsh here for Depo-Provera Injection. Injection administered without complication. Patient will return in 3 months for next injection between November 19 and December 3. Next annual visit due June 2025. Patient has been out of BP med for a couple days but requested a refill this morning.  Marylynn Pearson, RN 07/01/2023  9:29 AM

## 2023-08-15 ENCOUNTER — Other Ambulatory Visit (HOSPITAL_BASED_OUTPATIENT_CLINIC_OR_DEPARTMENT_OTHER): Payer: Self-pay

## 2023-08-15 MED ORDER — INFLUENZA VIRUS VACC SPLIT PF (FLUZONE) 0.5 ML IM SUSY
0.5000 mL | PREFILLED_SYRINGE | Freq: Once | INTRAMUSCULAR | 0 refills | Status: AC
  Filled 2023-08-15: qty 0.5, 1d supply, fill #0

## 2023-09-16 ENCOUNTER — Other Ambulatory Visit: Payer: Self-pay

## 2023-09-16 ENCOUNTER — Ambulatory Visit: Payer: 59

## 2023-09-16 VITALS — BP 130/84 | HR 93 | Ht 64.0 in | Wt 302.1 lb

## 2023-09-16 DIAGNOSIS — Z3042 Encounter for surveillance of injectable contraceptive: Secondary | ICD-10-CM | POA: Diagnosis not present

## 2023-09-16 MED ORDER — MEDROXYPROGESTERONE ACETATE 150 MG/ML IM SUSY
150.0000 mg | PREFILLED_SYRINGE | Freq: Once | INTRAMUSCULAR | Status: AC
Start: 2023-09-16 — End: 2023-09-16
  Administered 2023-09-16: 150 mg via INTRAMUSCULAR

## 2023-09-16 NOTE — Progress Notes (Signed)
Sara Walsh here for Depo-Provera Injection. Injection administered without complication. Patient will return in 3 months for next injection between Feb 4 and Feb 18. Next annual visit due 04/2024.  Next Depo scheduled for 12/09/23.    Ralene Bathe, RN 09/16/2023  9:41 AM

## 2023-12-09 ENCOUNTER — Ambulatory Visit: Payer: 59

## 2023-12-09 ENCOUNTER — Other Ambulatory Visit: Payer: Self-pay

## 2023-12-09 VITALS — BP 149/98 | HR 92 | Ht 64.0 in | Wt 302.0 lb

## 2023-12-09 DIAGNOSIS — Z3042 Encounter for surveillance of injectable contraceptive: Secondary | ICD-10-CM | POA: Diagnosis not present

## 2023-12-09 MED ORDER — MEDROXYPROGESTERONE ACETATE 150 MG/ML IM SUSY
150.0000 mg | PREFILLED_SYRINGE | Freq: Once | INTRAMUSCULAR | Status: AC
Start: 2023-12-09 — End: 2023-12-09
  Administered 2023-12-09: 150 mg via INTRAMUSCULAR

## 2023-12-09 NOTE — Progress Notes (Signed)
Sara Walsh here for Depo-Provera Injection. Injection administered without complication. Patient will return in 3 months for next injection between 4/29 and 5/13. Next annual visit due June 2025.  BP was elevated during visit. BP was 151/99 and recheck BP was 149/98. Patient has a diagnosis of hypertension and reports compliant with amlodipine 2.5 mg. Patient also reports she has a appointment with her PCP next week and a BP cuff at home. Advised patient to recheck BP at home and to contact her PCP regarding her BP. Patient voiced understanding.    Quintella Reichert, RN 12/09/2023  9:52 AM

## 2023-12-15 ENCOUNTER — Other Ambulatory Visit (HOSPITAL_BASED_OUTPATIENT_CLINIC_OR_DEPARTMENT_OTHER): Payer: Self-pay

## 2023-12-15 DIAGNOSIS — E611 Iron deficiency: Secondary | ICD-10-CM | POA: Diagnosis not present

## 2023-12-15 DIAGNOSIS — E782 Mixed hyperlipidemia: Secondary | ICD-10-CM | POA: Diagnosis not present

## 2023-12-15 MED ORDER — AMLODIPINE BESYLATE 2.5 MG PO TABS
2.5000 mg | ORAL_TABLET | Freq: Every day | ORAL | 1 refills | Status: DC
  Filled 2023-12-15 – 2024-02-07 (×2): qty 90, 90d supply, fill #0
  Filled 2024-05-27: qty 90, 90d supply, fill #1

## 2024-02-07 ENCOUNTER — Other Ambulatory Visit (HOSPITAL_BASED_OUTPATIENT_CLINIC_OR_DEPARTMENT_OTHER): Payer: Self-pay

## 2024-02-23 ENCOUNTER — Other Ambulatory Visit (HOSPITAL_BASED_OUTPATIENT_CLINIC_OR_DEPARTMENT_OTHER): Payer: Self-pay

## 2024-03-01 ENCOUNTER — Ambulatory Visit: Payer: 59

## 2024-04-05 ENCOUNTER — Encounter (HOSPITAL_COMMUNITY): Payer: Self-pay | Admitting: Emergency Medicine

## 2024-04-05 ENCOUNTER — Ambulatory Visit (INDEPENDENT_AMBULATORY_CARE_PROVIDER_SITE_OTHER)

## 2024-04-05 ENCOUNTER — Ambulatory Visit (HOSPITAL_COMMUNITY)
Admission: EM | Admit: 2024-04-05 | Discharge: 2024-04-05 | Disposition: A | Discharge status: Home / Self Care | Attending: Family Medicine | Admitting: Family Medicine

## 2024-04-05 DIAGNOSIS — M722 Plantar fascial fibromatosis: Secondary | ICD-10-CM

## 2024-04-05 DIAGNOSIS — M79671 Pain in right foot: Secondary | ICD-10-CM | POA: Diagnosis not present

## 2024-04-05 MED ORDER — IBUPROFEN 800 MG PO TABS: 800.0000 mg | ORAL_TABLET | Freq: Three times a day (TID) | ORAL | 0 refills | Status: AC | PRN

## 2024-04-05 NOTE — Discharge Instructions (Signed)
 The x-rays by my review are negative for any problem.  The radiologist will also read your x-ray, and if their interpretation differs significantly from mine, and the management of your condition would change, we will call you.  Take ibuprofen  800 mg--1 tab every 8 hours as needed for pain.  See the education in this paperwork about plantar fasciitis.  Please do heel stretches at least before you get up in the morning and several other times during the day.  Ice on the painful area can help    Also getting a heel cup or half insert for your shoes can help.  Please follow-up with your primary care

## 2024-04-05 NOTE — ED Triage Notes (Signed)
 Pt c/o right foot pain that started Friday. Pain radiates up calf. Denies any injury. Hasn't tried anything to help with the pain. Toes are not painful.

## 2024-04-05 NOTE — ED Provider Notes (Signed)
 [facility]    CSN: [id number] Arrival date & time: 04/05/24  4098      History   Chief Complaint Chief Complaint  Patient presents with   Foot Pain    HPI Sara Walsh is a 38 y.o. female.    Foot Pain  Here for right foot and heel pain.  It has been bothering her since June 6.  No trauma or fall.  It hurts especially when she gets up first thing in the morning or after she has been sitting and then gets up to walk.  No fever or chills.  No diabetes.  She does have a history of hypertension.  NKDA  Last menstrual cycle was a while ago, as she is on the Depo-Provera  injection.  She has not take anything so far for this discomfort.  Past Medical History:  Diagnosis Date   Chlamydia    Fibroid    Gestational diabetes    History of gestational diabetes 12/30/2018   Hypertension    Obesity    Scabies    Thyroid  disease    Trichomonas infection     Patient Active Problem List   Diagnosis Date Noted   Surveillance for Depo-Provera  contraception 03/09/2020   Dyspareunia in female 09/29/2019   Constipation 06/08/2018   Essential hypertension 05/18/2018   Uterine fibroid 01/08/2018   BV (bacterial vaginosis) 01/08/2018    Past Surgical History:  Procedure Laterality Date   CESAREAN SECTION     C/S x 5   CESAREAN SECTION WITH BILATERAL TUBAL LIGATION N/A 11/27/2018   Procedure: CESAREAN SECTION;  Surgeon: Granville Layer, MD;  Location: [facility];  Service: Obstetrics;  Laterality: N/A;   WISDOM TOOTH EXTRACTION      OB History     Gravida  11   Para  5   Term  4   Preterm  1   AB  6   Living  5      SAB  1   IAB  5   Ectopic  0   Multiple  0   Live Births  5            Home Medications    Prior to Admission medications   Medication Sig Start Date End Date Taking? Authorizing Provider  ibuprofen  (ADVIL ) 800 MG tablet Take 1 tablet (800 mg total) by mouth every 8 (eight) hours as needed (pain). 04/05/24  Yes  Dannisha Eckmann, Paige Boatman, MD  amLODipine  (NORVASC ) 2.5 MG tablet Take 1 tablet (2.5 mg total) by mouth daily. 12/15/23       Family History Family History  Problem Relation Age of Onset   Hypertension Mother    Hypertension Father    Hypertension Sister    Anesthesia problems Neg Hx     Social History Social History   Tobacco Use   Smoking status: Former    Current packs/day: 0.00    Types: Cigars, Cigarettes    Quit date: 09/09/2011    Years since quitting: 12.5   Smokeless tobacco: Never  Vaping Use   Vaping status: Never Used  Substance Use Topics   Alcohol use: No   Drug use: Not Currently    Types: Marijuana    Comment: weekends occasionally     Allergies   Patient has no known allergies.   Review of Systems Review of Systems   Physical Exam Triage Vital Signs ED Triage Vitals  Encounter Vitals Group     BP 04/05/24 1129 130/84  Systolic BP Percentile --      Diastolic BP Percentile --      Pulse Rate 04/05/24 1129 99     Resp 04/05/24 1129 20     Temp 04/05/24 1129 98.1 F (36.7 C)     Temp Source 04/05/24 1129 Oral     SpO2 04/05/24 1129 97 %     Weight --      Height --      Head Circumference --      Peak Flow --      Pain Score 04/05/24 1128 7     Pain Loc --      Pain Education --      Exclude from Growth Chart --    No data found.  Updated Vital Signs BP 130/84 (BP Location: Right Arm)   Pulse 99   Temp 98.1 F (36.7 C) (Oral)   Resp 20   SpO2 97%   Visual Acuity Right Eye Distance:   Left Eye Distance:   Bilateral Distance:    Right Eye Near:   Left Eye Near:    Bilateral Near:     Physical Exam Vitals reviewed.  Constitutional:      General: She is not in acute distress.    Appearance: She is not ill-appearing, toxic-appearing or diaphoretic.  Musculoskeletal:     Comments: There is some tenderness in the instep on the right foot.  No erythema or deformity or rash.  Pulses normal in the dorsalis pedis..  Skin:     Coloration: Skin is not pale.  Neurological:     General: No focal deficit present.     Mental Status: She is alert.  Psychiatric:        Behavior: Behavior normal.      UC Treatments / Results  Labs (all labs ordered are listed, but only abnormal results are displayed) Labs Reviewed - No data to display  EKG   Radiology No results found.  Procedures Procedures (including critical care time)  Medications Ordered in UC Medications - No data to display  Initial Impression / Assessment and Plan / UC Course  I have reviewed the triage vital signs and the nursing notes.  Pertinent labs & imaging results that were available during my care of the patient were reviewed by me and considered in my medical decision making (see chart for details).     X-ray by my review does not show any acute bony changes.  I do think this is most likely consistent with plantar fasciitis.  (Last EGFR in care everywhere was 104, reviewed today)  Ibuprofen  prescription strength is sent in to treat the pain, and she is given instructions for heel stretches and ice bath.  She will follow-up with her primary care.  She is also given con information for podiatry Final Clinical Impressions(s) / UC Diagnoses   Final diagnoses:  Right foot pain  Plantar fasciitis of right foot     Discharge Instructions      The x-rays by my review are negative for any problem.  The radiologist will also read your x-ray, and if their interpretation differs significantly from mine, and the management of your condition would change, we will call you.  Take ibuprofen  800 mg--1 tab every 8 hours as needed for pain.  See the education in this paperwork about plantar fasciitis.  Please do heel stretches at least before you get up in the morning and several other times during the day.  Ice on the  painful area can help    Also getting a heel cup or half insert for your shoes can help.  Please follow-up with your  primary care   ED Prescriptions     Medication Sig Dispense Auth. Provider   ibuprofen  (ADVIL ) 800 MG tablet Take 1 tablet (800 mg total) by mouth every 8 (eight) hours as needed (pain). 21 tablet Garret Teale K, MD      I have reviewed the PDMP during this encounter.   Ann Keto, MD 04/05/24 [phone]

## 2024-06-14 ENCOUNTER — Other Ambulatory Visit: Payer: Self-pay | Admitting: Nephrology

## 2024-06-14 ENCOUNTER — Other Ambulatory Visit (HOSPITAL_BASED_OUTPATIENT_CLINIC_OR_DEPARTMENT_OTHER): Payer: Self-pay

## 2024-06-14 ENCOUNTER — Other Ambulatory Visit: Payer: Self-pay

## 2024-06-14 DIAGNOSIS — I10 Essential (primary) hypertension: Secondary | ICD-10-CM | POA: Diagnosis not present

## 2024-06-14 DIAGNOSIS — E559 Vitamin D deficiency, unspecified: Secondary | ICD-10-CM | POA: Diagnosis not present

## 2024-06-14 DIAGNOSIS — E782 Mixed hyperlipidemia: Secondary | ICD-10-CM | POA: Diagnosis not present

## 2024-06-14 DIAGNOSIS — Z1231 Encounter for screening mammogram for malignant neoplasm of breast: Secondary | ICD-10-CM

## 2024-06-14 MED ORDER — MOUNJARO 2.5 MG/0.5ML ~~LOC~~ SOAJ
2.5000 mg | SUBCUTANEOUS | 1 refills | Status: DC
  Filled 2024-06-14: qty 2, 30d supply, fill #0

## 2024-06-16 ENCOUNTER — Other Ambulatory Visit (HOSPITAL_BASED_OUTPATIENT_CLINIC_OR_DEPARTMENT_OTHER): Payer: Self-pay

## 2024-06-16 MED ORDER — VITAMIN D (ERGOCALCIFEROL) 1.25 MG (50000 UNIT) PO CAPS
50000.0000 [IU] | ORAL_CAPSULE | ORAL | 1 refills | Status: AC
  Filled 2024-06-16: qty 12, 84d supply, fill #0
  Filled 2024-09-17: qty 12, 84d supply, fill #1

## 2024-06-17 ENCOUNTER — Other Ambulatory Visit (HOSPITAL_BASED_OUTPATIENT_CLINIC_OR_DEPARTMENT_OTHER): Payer: Self-pay

## 2024-06-24 ENCOUNTER — Other Ambulatory Visit (HOSPITAL_BASED_OUTPATIENT_CLINIC_OR_DEPARTMENT_OTHER): Payer: Self-pay

## 2024-07-07 ENCOUNTER — Other Ambulatory Visit (HOSPITAL_BASED_OUTPATIENT_CLINIC_OR_DEPARTMENT_OTHER): Payer: Self-pay

## 2024-07-10 ENCOUNTER — Other Ambulatory Visit (HOSPITAL_BASED_OUTPATIENT_CLINIC_OR_DEPARTMENT_OTHER): Payer: Self-pay

## 2024-07-22 ENCOUNTER — Other Ambulatory Visit (HOSPITAL_BASED_OUTPATIENT_CLINIC_OR_DEPARTMENT_OTHER): Payer: Self-pay

## 2024-07-28 ENCOUNTER — Other Ambulatory Visit (HOSPITAL_BASED_OUTPATIENT_CLINIC_OR_DEPARTMENT_OTHER): Payer: Self-pay

## 2024-07-29 ENCOUNTER — Other Ambulatory Visit (HOSPITAL_BASED_OUTPATIENT_CLINIC_OR_DEPARTMENT_OTHER): Payer: Self-pay

## 2024-07-29 MED ORDER — FLUZONE 0.5 ML IM SUSY
0.5000 mL | PREFILLED_SYRINGE | Freq: Once | INTRAMUSCULAR | 0 refills | Status: AC
  Filled 2024-07-29: qty 0.5, 1d supply, fill #0

## 2024-09-06 ENCOUNTER — Ambulatory Visit
Admission: RE | Admit: 2024-09-06 | Discharge: 2024-09-06 | Disposition: A | Discharge status: Home / Self Care | Source: Ambulatory Visit | Attending: Nephrology | Admitting: Nephrology

## 2024-09-06 DIAGNOSIS — Z1231 Encounter for screening mammogram for malignant neoplasm of breast: Secondary | ICD-10-CM | POA: Diagnosis not present

## 2024-09-17 ENCOUNTER — Other Ambulatory Visit (HOSPITAL_BASED_OUTPATIENT_CLINIC_OR_DEPARTMENT_OTHER): Payer: Self-pay

## 2024-09-17 ENCOUNTER — Other Ambulatory Visit: Payer: Self-pay

## 2024-09-21 ENCOUNTER — Other Ambulatory Visit (HOSPITAL_BASED_OUTPATIENT_CLINIC_OR_DEPARTMENT_OTHER): Payer: Self-pay

## 2024-09-21 ENCOUNTER — Other Ambulatory Visit (HOSPITAL_BASED_OUTPATIENT_CLINIC_OR_DEPARTMENT_OTHER): Payer: Self-pay | Admitting: Nephrology

## 2024-09-29 ENCOUNTER — Other Ambulatory Visit (HOSPITAL_BASED_OUTPATIENT_CLINIC_OR_DEPARTMENT_OTHER): Payer: Self-pay

## 2024-09-29 ENCOUNTER — Other Ambulatory Visit (HOSPITAL_BASED_OUTPATIENT_CLINIC_OR_DEPARTMENT_OTHER): Payer: Self-pay | Admitting: Nephrology

## 2024-11-08 ENCOUNTER — Other Ambulatory Visit (HOSPITAL_BASED_OUTPATIENT_CLINIC_OR_DEPARTMENT_OTHER): Payer: Self-pay

## 2024-11-08 ENCOUNTER — Other Ambulatory Visit: Payer: Self-pay

## 2024-11-08 MED ORDER — AMLODIPINE BESYLATE 2.5 MG PO TABS
2.5000 mg | ORAL_TABLET | Freq: Every day | ORAL | 2 refills | Status: AC
  Filled 2024-11-08 (×2): qty 90, 90d supply, fill #0

## 2024-11-08 MED ORDER — MOUNJARO 2.5 MG/0.5ML ~~LOC~~ SOAJ
2.5000 mg | SUBCUTANEOUS | 2 refills | Status: AC
  Filled 2024-11-08: qty 2, 28d supply, fill #0

## 2024-11-09 ENCOUNTER — Other Ambulatory Visit (HOSPITAL_BASED_OUTPATIENT_CLINIC_OR_DEPARTMENT_OTHER): Payer: Self-pay

## 2024-11-09 ENCOUNTER — Other Ambulatory Visit: Payer: Self-pay

## 2024-11-13 ENCOUNTER — Encounter (HOSPITAL_COMMUNITY): Payer: Self-pay

## 2024-11-13 ENCOUNTER — Ambulatory Visit (INDEPENDENT_AMBULATORY_CARE_PROVIDER_SITE_OTHER): Payer: Self-pay

## 2024-11-13 ENCOUNTER — Other Ambulatory Visit (HOSPITAL_BASED_OUTPATIENT_CLINIC_OR_DEPARTMENT_OTHER): Payer: Self-pay

## 2024-11-13 ENCOUNTER — Ambulatory Visit (HOSPITAL_COMMUNITY)
Admission: EM | Admit: 2024-11-13 | Discharge: 2024-11-13 | Disposition: A | Discharge status: Home / Self Care | Payer: Self-pay

## 2024-11-13 DIAGNOSIS — M7918 Myalgia, other site: Secondary | ICD-10-CM

## 2024-11-13 MED ORDER — CYCLOBENZAPRINE HCL 10 MG PO TABS
10.0000 mg | ORAL_TABLET | Freq: Two times a day (BID) | ORAL | 0 refills | Status: AC | PRN
  Filled 2024-11-13: qty 20, 10d supply, fill #0

## 2024-11-13 MED ORDER — DICLOFENAC SODIUM 50 MG PO TBEC
50.0000 mg | DELAYED_RELEASE_TABLET | Freq: Two times a day (BID) | ORAL | 1 refills | Status: AC
  Filled 2024-11-13: qty 15, 8d supply, fill #0
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
## 2024-11-25 ENCOUNTER — Other Ambulatory Visit (HOSPITAL_BASED_OUTPATIENT_CLINIC_OR_DEPARTMENT_OTHER): Payer: Self-pay
